# Patient Record
Sex: Female | Born: 1965 | Race: Black or African American | Hispanic: No | Marital: Single | Smoking: Current every day smoker
Health system: Southern US, Community
[De-identification: ages and names within clinical notes are randomized; demographics above are authoritative.]

## PROBLEM LIST (undated history)

## (undated) DIAGNOSIS — E119 Type 2 diabetes mellitus without complications: Secondary | ICD-10-CM

## (undated) DIAGNOSIS — E785 Hyperlipidemia, unspecified: Secondary | ICD-10-CM

## (undated) DIAGNOSIS — N184 Chronic kidney disease, stage 4 (severe): Secondary | ICD-10-CM

## (undated) DIAGNOSIS — Z992 Dependence on renal dialysis: Secondary | ICD-10-CM

## (undated) DIAGNOSIS — E079 Disorder of thyroid, unspecified: Secondary | ICD-10-CM

## (undated) DIAGNOSIS — I10 Essential (primary) hypertension: Secondary | ICD-10-CM

## (undated) DIAGNOSIS — N186 End stage renal disease: Secondary | ICD-10-CM

## (undated) DIAGNOSIS — D638 Anemia in other chronic diseases classified elsewhere: Secondary | ICD-10-CM

## (undated) HISTORY — DX: Hyperlipidemia, unspecified: E78.5

## (undated) HISTORY — DX: Disorder of thyroid, unspecified: E07.9

## (undated) HISTORY — DX: Essential (primary) hypertension: I10

---

## 1999-06-13 HISTORY — PX: CYSTECTOMY: SHX5119

## 2003-03-31 ENCOUNTER — Emergency Department (HOSPITAL_COMMUNITY): Admission: AD | Admit: 2003-03-31 | Discharge: 2003-03-31 | Payer: Self-pay | Admitting: Family Medicine

## 2008-03-18 ENCOUNTER — Emergency Department (HOSPITAL_COMMUNITY): Admission: EM | Admit: 2008-03-18 | Discharge: 2008-03-19 | Payer: Self-pay | Admitting: Emergency Medicine

## 2009-09-10 ENCOUNTER — Encounter: Payer: Self-pay | Admitting: Family

## 2009-09-10 LAB — CONVERTED CEMR LAB: Pap Smear: NORMAL

## 2009-09-10 LAB — HM MAMMOGRAPHY: HM Mammogram: NORMAL

## 2009-11-29 LAB — HM PAP SMEAR: HM Pap smear: NORMAL

## 2010-05-25 ENCOUNTER — Emergency Department (HOSPITAL_BASED_OUTPATIENT_CLINIC_OR_DEPARTMENT_OTHER)
Admission: EM | Admit: 2010-05-25 | Discharge: 2010-05-25 | Payer: Self-pay | Source: Home / Self Care | Admitting: Emergency Medicine

## 2010-05-30 ENCOUNTER — Ambulatory Visit: Payer: Self-pay | Admitting: Family

## 2010-05-30 ENCOUNTER — Emergency Department (HOSPITAL_BASED_OUTPATIENT_CLINIC_OR_DEPARTMENT_OTHER)
Admission: EM | Admit: 2010-05-30 | Discharge: 2010-05-30 | Payer: Self-pay | Source: Home / Self Care | Admitting: Emergency Medicine

## 2010-08-12 ENCOUNTER — Ambulatory Visit (INDEPENDENT_AMBULATORY_CARE_PROVIDER_SITE_OTHER): Payer: Self-pay | Admitting: Family

## 2010-08-12 ENCOUNTER — Encounter: Payer: Self-pay | Admitting: Family

## 2010-08-12 DIAGNOSIS — N898 Other specified noninflammatory disorders of vagina: Secondary | ICD-10-CM

## 2010-08-12 DIAGNOSIS — E119 Type 2 diabetes mellitus without complications: Secondary | ICD-10-CM | POA: Insufficient documentation

## 2010-08-12 DIAGNOSIS — I10 Essential (primary) hypertension: Secondary | ICD-10-CM

## 2010-08-12 DIAGNOSIS — E041 Nontoxic single thyroid nodule: Secondary | ICD-10-CM

## 2010-08-16 LAB — CONVERTED CEMR LAB
Gardnerella vaginalis: NEGATIVE
Trichomonal Vaginitis: NEGATIVE

## 2010-08-18 NOTE — Assessment & Plan Note (Signed)
Summary: seen in ed/high bp and diabetes/self pay/ss--rm 4   Vital Signs:  Patient profile:   44 year old female Height:      65 inches Weight:      284 pounds BMI:     47.43 Temp:     100 degrees F oral Pulse rate:   96 / minute Pulse rhythm:   regular Resp:     18 per minute BP sitting:   180 / 108  (right arm) Cuff size:   large  Vitals Entered By: Mervin Kung CMA [facility] Dull) (August 12, 2010 1:54 PM) CC: New pt to establish care. Referred from ED for hypertension and diabetes. Also has vaginal itching and white discharge. Has used OTC cream without relief. Is Patient Diabetic? Yes Pain Assessment Patient in pain? no      CBG Result 424 Comments Pt states she reduced Metformin to 1 tablet daily due to low BS (96) and feeling jittery.   CC:  New pt to establish care. Referred from ED for hypertension and diabetes. Also has vaginal itching and white discharge. Has used OTC cream without relief..  History of Present Illness: Ms.  Formoso is a 45 year old female who presents today to establish care.  She was seen in the ED in December for hyperglycemia and hypertension.  Moved here several months ago.   Has had meds from old provider until recently.  1) HTN- ran out of her lisinopril/hctz- 24 hours ago.    2) DM2-  was diagnosed in her 30s.  Takes metformin, sugar last night was 215,  + polyuria.   3) Tobacco abuse- smokes about 5 cigarettes a day.    4) Vaginal itching/discharge- tried OTC monistat without improvement.  Preventive Screening-Counseling & Management  Alcohol-Tobacco     Alcohol drinks/day: 0     Smoking Status: current     Packs/Day: <0.25     Pack years: 20  Caffeine-Diet-Exercise     Caffeine use/day: none     Does Patient Exercise: no      Drug Use:  no.    Allergies (verified): No Known Drug Allergies  Past History:  Past Medical History: Diabetes Frequent headaches HTN Hypercholesterolemia ? thyroid problem in the past Renal  insufficiency (1.6-1.9 creatinine)  Past Surgical History: c-section x 2 cystectomy, thyroid-- 2001  Family History: mother--living diabetes, htn, hypercholesterolemia father--deceased; lung cancer-  non-smoker,  died at 90 1 brother-- heart problems,  MI at age 39.   1 brother-- a & w 1 sister- diabetes 1 sister- a&w  2 sons-- a & w ages 21 and 18.    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  started at age 46.   Alcohol use-no Drug use-no Regular exercise-no Smoking Status:  current Packs/Day:  <0.25 Caffeine use/day:  none Does Patient Exercise:  no Drug Use:  no  Review of Systems       Constitutional: Denies Fever ENT:  Denies nasal congestion or sore throat. Resp: + cough- ? due to lisinopril CV:  Denies Chest Pain GI:  Denies nausea or vomitting or diarrhea GU: Denies dysuria.   GYN:  + vaginal discharge, + vaginal itching. Lymphatic: Denies lymphadenopathy Musculoskeletal:  Denies joint pain (right) Skin:  Denies Rashes Psychiatric: Denies depression Neuro: Denies numbness     Physical Exam  General:  Well-developed,well-nourished,in no acute distress; alert,appropriate and cooperative throughout examination Head:  Normocephalic and  atraumatic without obvious abnormalities. No apparent alopecia or balding. Eyes:  PERRLA, sclera clear Ears:  External ear exam shows no significant lesions or deformities.  Otoscopic examination reveals clear canals, tympanic membranes are intact bilaterally without bulging, retraction, inflammation or discharge. Hearing is grossly normal bilaterally. Neck:  No deformities, masses, or tenderness noted. Lungs:  Normal respiratory effort, chest expands symmetrically. Lungs are clear to auscultation, no crackles or wheezes. Heart:  Normal rate and regular rhythm. S1 and S2 normal without gallop, murmur, click, rub or other extra  sounds. Genitalia:  Pelvic Exam:        External: normal female genitalia without lesions or masses        Vagina: normal without lesions or masses        Cervix: normal without lesions or masses        Adnexa: normal bimanual exam without masses or fullness        Uterus: normal by palpation        Pap smear: not performed   Impression & Recommendations:  Problem # 1:  DIABETES MELLITUS, TYPE II (ICD-250.00)  glucose today is 424- pt reports that she took her metformin.  Due to renal insuffiency, it is not safe for her to continue her metformin.  Suspect a1c will be close to 11.  Will initiate insulin.  Pt was taught on proper insulin administration.  Given sample pen needles and levemir flex pen.  Will check labs as below.    Her updated medication list for this problem includes:    Levemir Flexpen 100 Unit/ml Soln (Insulin detemir) .Marland KitchenMarland KitchenMarland KitchenMarland Kitchen 15 units subcutaneous once daily in the evening.  Orders: T-Hgb A1C (81191-47829) TLB-Microalbumin/Creat Ratio, Urine (82043-MALB) Fingerstick (36416) Glucose, (CBG) (56213)  Problem # 2:  THYROID CYST (ICD-246.2) Assessment: Comment Only Pt reports that this was sugically removed.   Orders: TLB-TSH (Thyroid Stimulating Hormone) (84443-TSH)  Problem # 3:  HYPERTENSION (ICD-401.9) Assessment: Unchanged Not using birth control. Given age and smoking- not a good candidate for OCP.  Recommended that she stop ACE inhibitor for now due to pregnancy category D.  Continue HCTZ, add labetalol.   Her updated medication list for this problem includes:    Hydrochlorothiazide 25 Mg Tabs (Hydrochlorothiazide) ..... One tablet by mouth daily    Labetalol Hcl 100 Mg Tabs (Labetalol hcl) ..... One tablet by mouth two times a day  Her updated medication list for this problem includes:    Hydrochlorothiazide 25 Mg Tabs (Hydrochlorothiazide) ..... One tablet by mouth daily    Labetalol Hcl 100 Mg Tabs (Labetalol hcl) ..... One tablet by mouth two times a  day  Problem # 4:  VAGINAL DISCHARGE (ICD-623.5) Assessment: New will treat with diflucan.   Wet prep performed today.  Complete Medication List: 1)  Hydrochlorothiazide 25 Mg Tabs (Hydrochlorothiazide) .... One tablet by mouth daily 2)  Levemir Flexpen 100 Unit/ml Soln (Insulin detemir) .Marland Kitchen.. 15 units subcutaneous once daily in the evening. 3)  Labetalol Hcl 100 Mg Tabs (Labetalol hcl) .... One tablet by mouth two times a day 4)  Fluconazole 150 Mg Tabs (Fluconazole) .... One tablet by mouth now, may repeat in 3 days if continued vaginal itching.  Other Orders: Specimen Handling (08657) T-Wet Prep by [facility] [phone])  Patient Instructions: 1)  Stop metformin.   2)  Start levemir. 3)  Call if sugars greater than 300 after starting levemir, or if you develop sugars less than 80. 4)  Follow up in 2 weeks. Prescriptions: FLUCONAZOLE 150 MG  TABS (FLUCONAZOLE) one tablet by mouth now, may repeat in 3 days if continued vaginal itching.  #2 x 0   Entered and Authorized by:   Lemont Fillers FNP   Signed by:   Lemont Fillers FNP on 08/12/2010   Method used:   Electronically to        [facility] [phone]* (retail)       [address]       [place], [place]  [address]       Ph: [phone]       Fax: [phone]   RxID:   [id number] LEVEMIR FLEXPEN 100 UNIT/ML SOLN (INSULIN DETEMIR) 15 units subcutaneous once daily in the evening.  #2 x 0   Entered and Authorized by:   Lemont Fillers FNP   Signed by:   Lemont Fillers FNP on 08/12/2010   Method used:   Samples Given   RxID:   [id number] LABETALOL HCL 100 MG TABS (LABETALOL HCL) one tablet by mouth two times a day  #60 x 1   Entered and Authorized by:   Lemont Fillers FNP   Signed by:   Lemont Fillers FNP on 08/12/2010   Method used:   Electronically to        [facility] [phone]* (retail)       [address]       [place], [place]  [address]       Ph: [phone]        Fax: [phone]   RxID:   [id number] HYDROCHLOROTHIAZIDE 25 MG TABS (HYDROCHLOROTHIAZIDE) one tablet by mouth daily  #30 x 1   Entered and Authorized by:   Lemont Fillers FNP   Signed by:   Lemont Fillers FNP on 08/12/2010   Method used:   Electronically to        [facility] [phone]* (retail)       [address]       [place], [place]  [address]       Ph: [phone]       Fax: [phone]   RxID:   [id number]    Orders Added: 1)  TLB-TSH (Thyroid Stimulating Hormone) [84443-TSH] 2)  T-Hgb A1C [83036-23375] 3)  TLB-Microalbumin/Creat Ratio, Urine [82043-MALB] 4)  Fingerstick [36416] 5)  Glucose, (CBG) [82962] 6)  New Patient Level II [99202] 7)  Specimen Handling [99000] 8)  T-Wet Prep by Molecular Probe [99371-69678] 9)  New Patient Level III [93810]    Current Allergies (reviewed today): No known allergies     Preventive Care Screening  PPD:    Date:  10/10/2009    Results:  negative   Mammogram:    Date:  09/10/2009    Results:  normal   Pap Smear:    Date:  09/10/2009    Results:  normal      Doesn't remember last tetanus.   Laboratory Results   Blood Tests    Date/Time Reported: Mervin Kung CMA [facility] Dull)  August 12, 2010 2:43 PM   CBG Random:: 424mg /dL

## 2010-08-19 ENCOUNTER — Telehealth: Payer: Self-pay | Admitting: Family

## 2010-08-22 LAB — BASIC METABOLIC PANEL
BUN: 22 mg/dL (ref 6–23)
Chloride: 104 mEq/L (ref 96–112)
Glucose, Bld: 349 mg/dL — ABNORMAL HIGH (ref 70–99)
Potassium: 4.5 mEq/L (ref 3.5–5.1)

## 2010-08-22 LAB — URINALYSIS, ROUTINE W REFLEX MICROSCOPIC
Bilirubin Urine: NEGATIVE
Ketones, ur: NEGATIVE mg/dL
Nitrite: NEGATIVE
Specific Gravity, Urine: 1.022 (ref 1.005–1.030)
Urobilinogen, UA: 0.2 mg/dL (ref 0.0–1.0)

## 2010-08-22 LAB — PREGNANCY, URINE: Preg Test, Ur: NEGATIVE

## 2010-08-23 LAB — BASIC METABOLIC PANEL
GFR calc Af Amer: 35 mL/min — ABNORMAL LOW (ref >60)
GFR calc non Af Amer: 29 mL/min — ABNORMAL LOW (ref >60)
Glucose, Bld: 444 mg/dL — ABNORMAL HIGH (ref 70–99)
Potassium: 4.3 mEq/L (ref 3.5–5.1)
Sodium: 140 mEq/L (ref 135–145)

## 2010-08-23 LAB — GC/CHLAMYDIA PROBE AMP, GENITAL
Chlamydia, DNA Probe: NEGATIVE
GC Probe Amp, Genital: NEGATIVE

## 2010-08-23 LAB — GLUCOSE, CAPILLARY: Glucose-Capillary: 465 mg/dL — ABNORMAL HIGH (ref 70–99)

## 2010-08-23 LAB — WET PREP, GENITAL: Yeast Wet Prep HPF POC: NONE SEEN

## 2010-08-23 LAB — URINALYSIS, ROUTINE W REFLEX MICROSCOPIC
Bilirubin Urine: NEGATIVE
Glucose, UA: >1000 mg/dL — AB
Hgb urine dipstick: NEGATIVE
Specific Gravity, Urine: 1.025 (ref 1.005–1.030)
Urobilinogen, UA: 0.2 mg/dL (ref 0.0–1.0)

## 2010-08-23 LAB — URINE MICROSCOPIC-ADD ON

## 2010-08-29 ENCOUNTER — Ambulatory Visit: Payer: Self-pay | Admitting: Family

## 2010-08-29 DIAGNOSIS — Z0289 Encounter for other administrative examinations: Secondary | ICD-10-CM

## 2010-08-30 NOTE — Progress Notes (Signed)
Summary: lab order, f/u  Phone Note Outgoing Call   Summary of Call: Please call patient and ask her to return to the lab at her earilest convenience to complete lab work ordered during her visit.  Initial call taken by: Lemont Fillers FNP,  August 19, 2010 8:22 AM  Follow-up for Phone Call        Left message on machine to return my call. Nicki Guadalajara Fergerson CMA [facility] Dull)  August 19, 2010 8:36 AM   Pt notified and states she is having transportation difficulties at present. Advised pt she could have blood work done at her 2 week f/u. Appt scheduled for 08/29/10 @ 2:15pm. Mervin Kung CMA (AAMA)  August 22, 2010 9:18 AM

## 2010-10-05 ENCOUNTER — Telehealth: Payer: Self-pay | Admitting: *Deleted

## 2010-10-05 MED ORDER — INSULIN DETEMIR 100 UNIT/ML ~~LOC~~ SOLN: 15.0000 [IU] | Freq: Every evening | SUBCUTANEOUS | Status: DC

## 2010-10-05 NOTE — Telephone Encounter (Signed)
Pt called stating she does not have ins. Coverage and her hours have been cut back at work. States she has not been able to afford to have lab work completed. She has scheduled a follow up for 10/07/10 and is requesting a refill on her Levemir insulin. Advised pt she could pick up samples at the office. Gave pt # for Professional Fee Billing to see if she will qualify for assistance.

## 2010-10-06 ENCOUNTER — Encounter: Payer: Self-pay | Admitting: Family

## 2010-10-07 ENCOUNTER — Ambulatory Visit: Payer: Self-pay | Admitting: Family

## 2010-10-10 ENCOUNTER — Ambulatory Visit: Payer: Self-pay | Admitting: Family

## 2010-10-10 DIAGNOSIS — Z0289 Encounter for other administrative examinations: Secondary | ICD-10-CM

## 2010-11-01 ENCOUNTER — Encounter: Payer: Self-pay | Admitting: Family

## 2010-11-01 ENCOUNTER — Telehealth: Payer: Self-pay | Admitting: Family

## 2010-11-01 ENCOUNTER — Ambulatory Visit (INDEPENDENT_AMBULATORY_CARE_PROVIDER_SITE_OTHER): Payer: Self-pay | Admitting: Family

## 2010-11-01 DIAGNOSIS — L304 Erythema intertrigo: Secondary | ICD-10-CM | POA: Insufficient documentation

## 2010-11-01 DIAGNOSIS — B373 Candidiasis of vulva and vagina: Secondary | ICD-10-CM

## 2010-11-01 DIAGNOSIS — E119 Type 2 diabetes mellitus without complications: Secondary | ICD-10-CM

## 2010-11-01 DIAGNOSIS — B3731 Acute candidiasis of vulva and vagina: Secondary | ICD-10-CM | POA: Insufficient documentation

## 2010-11-01 DIAGNOSIS — R809 Proteinuria, unspecified: Secondary | ICD-10-CM | POA: Insufficient documentation

## 2010-11-01 DIAGNOSIS — L538 Other specified erythematous conditions: Secondary | ICD-10-CM

## 2010-11-01 DIAGNOSIS — I10 Essential (primary) hypertension: Secondary | ICD-10-CM

## 2010-11-01 MED ORDER — INSULIN DETEMIR 100 UNIT/ML ~~LOC~~ SOLN: SUBCUTANEOUS | Status: DC

## 2010-11-01 MED ORDER — CLONIDINE HCL 0.1 MG PO TABS: 0.1000 mg | ORAL_TABLET | Freq: Two times a day (BID) | ORAL | Status: DC

## 2010-11-01 MED ORDER — GLUCOSE BLOOD VI STRP: ORAL_STRIP | Status: AC

## 2010-11-01 MED ORDER — MICONAZOLE NITRATE 2 % EX AERP: INHALATION_SPRAY | CUTANEOUS | Status: DC

## 2010-11-01 MED ORDER — LABETALOL HCL 200 MG PO TABS: 200.0000 mg | ORAL_TABLET | Freq: Two times a day (BID) | ORAL | Status: DC

## 2010-11-01 NOTE — Assessment & Plan Note (Signed)
Recommended trial of monistat.

## 2010-11-01 NOTE — Assessment & Plan Note (Signed)
This is likely multifactorial in the setting of uncontrolled diabetes and hypertension which is uncontrolled. Unfortunately given her childbearing age she is not a good candidate for an ACE inhibitor at this time. She is not currently on any birth control and has a positive smoking history- therefore she is not a good candidate for oral contraceptive. IUD is not reasonable at this time due to cost. We will need to work to get her blood pressure and sugar optimally controlled.

## 2010-11-01 NOTE — Telephone Encounter (Signed)
Left message for patient to return our call.  When she calls back please let her know that I have sent a prescription for clonidine BID to her pharmacy as well to help with her BP. This is on the the $4 plan at [facility].

## 2010-11-01 NOTE — Assessment & Plan Note (Signed)
Recommended trial of lotrimin powder.

## 2010-11-01 NOTE — Assessment & Plan Note (Signed)
BP Readings from Last 3 Encounters:  11/01/10 180/110  08/12/10 180/108    Blood pressure is unchanged. She reports positive compliance with meds. Will increase her labetalol from 100 mg twice daily to 200 mg twice daily.

## 2010-11-01 NOTE — Progress Notes (Signed)
  Subjective:    Patient ID: Teresa Dalton, female    DOB: [birth year], 45 y.o.   MRN: [id number]  HPI  [name] is a 44 year old female who presents today for follow up of her diabetes and her HTN.  She is hopeful that she may obtain health insurance in the near future.  1. DM-  22 units of levemir in the morning.  Occasionally taking 12 units at night if her sugar is >200.  Fasting sugar is as low as 186 if she takes the levemir in the evening.    2. HTN- notes + compliance with her BP meds.   3. Rash- reports that she often gets rash beneath her breasts- worst in the heat.    Review of Systems  See HPI  Past Medical History  Diagnosis Date  . Diabetes mellitus   . Hypertension   . Hyperlipidemia   . Thyroid disease     ? past problem  . Chronic kidney disease     renal insufficiency (1.6-1.9 creatinine)  . Increased frequency of headaches     History   Social History  . Marital Status: Single    Spouse Name: N/A    Number of Children: 2  . Years of Education: N/A   Occupational History  . RESIDENCIAL COUNSELO    Social History Main Topics  . Smoking status: Current Everyday Smoker -- 17 years    Types: Cigarettes  . Smokeless tobacco: Not on file   Comment: 5 cigarettes daily  . Alcohol Use: No  . Drug Use: No  . Sexually Active: Not on file   Other Topics Concern  . Not on file   Social History Narrative   Regular exercise: noLives with one of her sons    Past Surgical History  Procedure Date  . Cesarean section     x 2  . Cystectomy 2001    thyroid    Family History  Problem Relation Age of Onset  . Diabetes Mother   . Heart disease Mother   . Hyperlipidemia Mother   . Cancer Father     lung, non-smoker. deceased 41yr  . Diabetes Sister   . Heart disease Brother     MI @ 27    No Known Allergies  Current Outpatient Prescriptions on File Prior to Visit  Medication Sig Dispense Refill  . hydrochlorothiazide 25 MG tablet Take  25 mg by mouth daily.        Marland Kitchen DISCONTD: insulin detemir (LEVEMIR FLEXPEN) 100 UNIT/ML injection Inject 15 Units into the skin every evening.  10 mL  12  . DISCONTD: labetalol (NORMODYNE) 100 MG tablet Take 100 mg by mouth 2 (two) times daily.          BP 180/110  Pulse 86  Temp(Src) 98.8 F (37.1 C) (Oral)  Resp 22  Wt 286 lb (129.729 kg)  SpO2 99%  LMP 10/11/2010        Objective:   Physical Exam  Constitutional: She appears well-developed and well-nourished.  Cardiovascular: Normal rate and regular rhythm.   Pulmonary/Chest: Effort normal and breath sounds normal.  Musculoskeletal: She exhibits no edema.          Assessment & Plan:  levemir flex pen 10/2012 [id number], #2

## 2010-11-01 NOTE — Assessment & Plan Note (Signed)
We will plan to titrate her Levemir upward. She has not been keeping record of her blood sugars. I have advised her to check her blood sugar at least twice a day once fasting and then again later in the afternoon or evening. She is to call within one week with these readings. She is then to followup in 2 weeks for followup appointment.

## 2010-11-01 NOTE — Patient Instructions (Signed)
Take 22 units of Levemir in the AM and 15 units of levemir in the pm. Check your fasting sugar and again in the afternoon or evening, record these results and call them to [facility] in 1 week. Call sooner if you develop sugar <80. Follow up in 2 weeks.

## 2010-11-02 MED ORDER — FLUCONAZOLE 150 MG PO TABS: ORAL_TABLET | ORAL | Status: DC

## 2010-11-02 NOTE — Telephone Encounter (Signed)
Pt notified. Pt requested med for yeast infection. Advised pt to try OTC monistat per Sandford Craze, NP. Pt states that monistat does not clear her infections in the past. Pt requests rx of Diflucan. Please advise.

## 2010-11-02 NOTE — Telephone Encounter (Signed)
Pt returned phone call. Best # [phone]

## 2010-11-02 NOTE — Telephone Encounter (Signed)
Rx sent 

## 2010-11-02 NOTE — Telephone Encounter (Signed)
Pt advised.

## 2010-11-08 ENCOUNTER — Ambulatory Visit (INDEPENDENT_AMBULATORY_CARE_PROVIDER_SITE_OTHER): Payer: Self-pay | Admitting: Family

## 2010-11-08 ENCOUNTER — Telehealth: Payer: Self-pay | Admitting: Family

## 2010-11-08 ENCOUNTER — Encounter: Payer: Self-pay | Admitting: Family

## 2010-11-08 DIAGNOSIS — H02849 Edema of unspecified eye, unspecified eyelid: Secondary | ICD-10-CM

## 2010-11-08 DIAGNOSIS — I10 Essential (primary) hypertension: Secondary | ICD-10-CM

## 2010-11-08 MED ORDER — ERYTHROMYCIN 5 MG/GM OP OINT: TOPICAL_OINTMENT | OPHTHALMIC | Status: DC

## 2010-11-08 MED ORDER — AMOXICILLIN-POT CLAVULANATE 875-125 MG PO TABS: 1.0000 | ORAL_TABLET | Freq: Two times a day (BID) | ORAL | Status: AC

## 2010-11-08 NOTE — Progress Notes (Signed)
Subjective:    Patient ID: Teresa Dalton, female    DOB: [birth year], 45 y.o.   MRN: [id number]  HPI Ms.  Stangelo is a 45 year old female who presents today with chief complaint of right eyelid swelling.  Symptoms started Friday morning.  Initially, just slightly swollen upper lid.  + associated itching.  +pus like drainage out of the right eye.  Denies associated fever or difficulty seeing out of the right eye.    HTN- has not been taking HCTZ every day due to "it makes me pee at work."  She skips the HCTZ on the days that she works 3x a week.  Also, only took 100mg  of labetalol this AM.  Never started the clonidine. Review of Systems See HPI  Past Medical History  Diagnosis Date  . Diabetes mellitus   . Hypertension   . Hyperlipidemia   . Thyroid disease     ? past problem  . Chronic kidney disease     renal insufficiency (1.6-1.9 creatinine)  . Increased frequency of headaches     History   Social History  . Marital Status: Single    Spouse Name: N/A    Number of Children: 2  . Years of Education: N/A   Occupational History  . [employer]    Social History Main Topics  . Smoking status: Current Everyday Smoker -- 17 years    Types: Cigarettes  . Smokeless tobacco: Not on file   Comment: 5 cigarettes daily  . Alcohol Use: No  . Drug Use: No  . Sexually Active: Not on file   Other Topics Concern  . Not on file   Social History Narrative   Regular exercise: noLives with one of her sons    Past Surgical History  Procedure Date  . Cesarean section     x 2  . Cystectomy 2001    thyroid    Family History  Problem Relation Age of Onset  . Diabetes Mother   . Heart disease Mother   . Hyperlipidemia Mother   . Cancer Father     lung, non-smoker. deceased 59yr  . Diabetes Sister   . Heart disease Brother     MI @ 61    No Known Allergies  Current Outpatient Prescriptions on File Prior to Visit  Medication Sig Dispense Refill  . glucose  blood test strip Use as instructed  100 each  11  . hydrochlorothiazide 25 MG tablet Take 25 mg by mouth daily.        . insulin detemir (LEVEMIR) 100 UNIT/ML injection Take as directed  10 mL  0  . labetalol (NORMODYNE) 200 MG tablet Take 1 tablet (200 mg total) by mouth 2 (two) times daily.  60 tablet  2  . cloNIDine (CATAPRES) 0.1 MG tablet Take 1 tablet (0.1 mg total) by mouth 2 (two) times daily.  60 tablet  30  . Miconazole Nitrate 2 % AERP Apply powder twice daily to affected areas  1 Can  0  . DISCONTD: fluconazole (DIFLUCAN) 150 MG tablet One tablet by mouth today- may repeat in 3 days if no improvement in symptoms.  2 tablet  0    BP 186/110  Pulse 78  Temp(Src) 98.6 F (37 C) (Oral)  Resp 18  Ht 5\' 5"  (1.651 m)  Wt 282 lb 1.9 oz (127.969 kg)  BMI 46.95 kg/m2  LMP 10/11/2010       Objective:   Physical Exam  Gen:  Morbidly  obese, AA female, awake, alert, NAD Eyes: R upper lid is swollen without significant associated erythema.  Sclera are clear without injection.  Vision is grossly intact.  PERRLA.          Assessment & Plan:

## 2010-11-08 NOTE — Assessment & Plan Note (Signed)
Plan empiric treatment for cellulitis with augmentin.  Will also treat with topical erythromycin.  My suspicion is that this is more of an allergic reaction- possibly to a bug bite.  She was instructed to call if increasing redness/swelling, pain or fever.  Go to ED if problems with vision.

## 2010-11-08 NOTE — Telephone Encounter (Signed)
Please call patient's pharmacy and ask them to fill her clonidine rx that I sent.

## 2010-11-08 NOTE — Patient Instructions (Addendum)
Call if you develop increased, pain, swelling, of the right eyelid or surrounding area or if you develop fever. Go to ER if you develop difficulty seeing out of your eye. Follow up on Friday 6/1.

## 2010-11-08 NOTE — Assessment & Plan Note (Signed)
BP Readings from Last 3 Encounters:  11/08/10 186/110  11/01/10 180/110  08/12/10 180/108   Very Non-compliant. I suspect that she is not taking her meds at all.  I urged her to take meds as directed and to pick up and start her clonidine today ASAP.  She verbalized understanding.  We discussed risks associated with uncontrolled HTN including stroke/death.

## 2010-11-11 NOTE — Telephone Encounter (Signed)
Call placed to [facility] pharmacy at [phone], spoke with  [facility] Rx filled for years supply.

## 2010-11-30 ENCOUNTER — Telehealth: Payer: Self-pay | Admitting: *Deleted

## 2010-11-30 NOTE — Telephone Encounter (Signed)
Received call from pt requesting samples of Levemir. Pt currently receiving Levemir Flexpen. We are out of flex pen but have vial. Advised pt I will show her how to use vial and syringe. Pt will meet with me at 11:30am today. Will plan to give pt assistance forms to pt to complete and mail to [employer] for insulin samples.

## 2010-12-01 NOTE — Telephone Encounter (Signed)
Pt stopped by office to pick up sample of Levemir vial. Syringes were provided to pt and pt was instructed on proper technique to draw up and give injection. Signed pt assistance form was given to pt to complete and mail to [employer] with financial documentation.

## 2010-12-01 NOTE — Telephone Encounter (Signed)
Pt did not stop by office yesterday. Spoke to pt this a.m. And she states she will come by today as she was unable to get by during our hours of operation yesterday. Advised pt that I have paperwork to give her to apply for medication assistance.

## 2010-12-30 ENCOUNTER — Telehealth: Payer: Self-pay | Admitting: Family

## 2010-12-30 MED ORDER — FLUCONAZOLE 150 MG PO TABS: ORAL_TABLET | ORAL | Status: DC

## 2010-12-30 NOTE — Telephone Encounter (Signed)
Rx sent to pharmacy, Pt requested that rx go to [facility].  Canceled rx on [address] location.  Pt instructed to call us if her symptoms do not resolve with use of diflucan.

## 2010-12-30 NOTE — Telephone Encounter (Signed)
Patient returned phone call. Best# [phone]

## 2010-12-30 NOTE — Telephone Encounter (Signed)
Pt states she has vaginal itching and slight discharge. Her blood sugars are running higher since symptoms began x 3 days. Pt states that OTC monistat has not been as effective as the pills we called in for her before. She is requesting refill on Fluconazole that was prescribed for her in March. Please advise.

## 2010-12-30 NOTE — Telephone Encounter (Signed)
Left message for pt to return my call.

## 2010-12-30 NOTE — Telephone Encounter (Signed)
Patient states that she has had a yeast infection for a couple of days ago. She states that she has seen [place] regarding this before. Patient also states that the yeast infection is making her blood sugar rise? [facility] on [address]

## 2011-01-20 ENCOUNTER — Telehealth: Payer: Self-pay | Admitting: *Deleted

## 2011-01-20 NOTE — Telephone Encounter (Signed)
Received call from pt requesting sample of Levemir insulin. Advised pt that she can pick up sample on Monday. Pt reports that she has enough medication to last her until then.  Pt has no follow up on file. Please advise when pt should be seen.

## 2011-01-20 NOTE — Telephone Encounter (Signed)
She is overdue for follow up.  I would like to see her next week.

## 2011-01-20 NOTE — Telephone Encounter (Signed)
Call placed to patient at [phone], she was advised per Sandford Craze instruction. Patient states that she has to work and can not schedule until the week of the 20th. Appointment made for Monday 01/30/2011 @ 2:30pm

## 2011-01-24 ENCOUNTER — Encounter: Payer: Self-pay | Admitting: Family

## 2011-01-30 ENCOUNTER — Ambulatory Visit: Payer: Self-pay | Admitting: Family

## 2011-01-30 DIAGNOSIS — Z029 Encounter for administrative examinations, unspecified: Secondary | ICD-10-CM

## 2011-02-24 ENCOUNTER — Telehealth: Payer: Self-pay | Admitting: *Deleted

## 2011-02-24 NOTE — Telephone Encounter (Signed)
Pt reports very painful menstrual cycles, "passes clots" x 1 year. Reports that Midol helps ease some of the pain but she feels she is not able to function normally as she sometimes has to stay in bed. Pt wants to discuss birth control or other options to help with these symptoms. Advised pt she should be seen in the office to discuss options. Pt states she has appt on Monday and will discuss with Melissa at that time.

## 2011-02-27 ENCOUNTER — Ambulatory Visit: Payer: Self-pay | Admitting: Family

## 2011-03-06 ENCOUNTER — Ambulatory Visit: Payer: Self-pay | Admitting: Family

## 2011-03-06 DIAGNOSIS — Z0289 Encounter for other administrative examinations: Secondary | ICD-10-CM

## 2011-03-09 ENCOUNTER — Telehealth: Payer: Self-pay | Admitting: *Deleted

## 2011-03-09 NOTE — Telephone Encounter (Signed)
Received call from pt requesting samples or refill of Levemir. Advised pt that she is past due for an appt and will need to be seen before we can provide further samples or Rx. Pt states she is without insurance and unemployed. States this is the reason she has not been keeping her appts as she does not want to run up bills that she cannot pay. Provided pt with the number to [facility] to call and get appt for continued care. Please advise re: refill.

## 2011-03-09 NOTE — Telephone Encounter (Signed)
I agree that the patient needs to be seen before further refills/samples are provided.

## 2011-03-10 ENCOUNTER — Ambulatory Visit (INDEPENDENT_AMBULATORY_CARE_PROVIDER_SITE_OTHER): Payer: Self-pay | Admitting: Family

## 2011-03-10 ENCOUNTER — Encounter: Payer: Self-pay | Admitting: Family

## 2011-03-10 VITALS — BP 170/108 | HR 78 | Temp 98.7°F | Resp 16 | Ht 65.0 in | Wt 282.0 lb

## 2011-03-10 DIAGNOSIS — I10 Essential (primary) hypertension: Secondary | ICD-10-CM

## 2011-03-10 DIAGNOSIS — Z720 Tobacco use: Secondary | ICD-10-CM

## 2011-03-10 DIAGNOSIS — N189 Chronic kidney disease, unspecified: Secondary | ICD-10-CM | POA: Insufficient documentation

## 2011-03-10 DIAGNOSIS — E119 Type 2 diabetes mellitus without complications: Secondary | ICD-10-CM

## 2011-03-10 DIAGNOSIS — F17200 Nicotine dependence, unspecified, uncomplicated: Secondary | ICD-10-CM

## 2011-03-10 MED ORDER — AMLODIPINE BESYLATE 5 MG PO TABS: 5.0000 mg | ORAL_TABLET | Freq: Every day | ORAL | Status: DC

## 2011-03-10 MED ORDER — INSULIN DETEMIR 100 UNIT/ML ~~LOC~~ SOLN: SUBCUTANEOUS | Status: DC

## 2011-03-10 NOTE — Telephone Encounter (Signed)
Spoke to pt, she states she has appt with [facility] on 04/01/11 but will be out of insulin after today. Schedule pt appt. For 11:15 today with Sandford Craze, NP.

## 2011-03-10 NOTE — Assessment & Plan Note (Signed)
She was counseled on smoking cessation- only smoking 2 cigarettes a day. I recommended the nicorette gum.

## 2011-03-10 NOTE — Patient Instructions (Signed)
Please complete your blood work on the first floor.  Start amlodipine for your blood pressure. Follow up in 1 month.

## 2011-03-10 NOTE — Progress Notes (Signed)
  Subjective:    Patient ID: Teresa Dalton, female    DOB: 06-28-1965, 45 y.o.   MRN: [id number]  HPI  1) DM2- She reports that she "raised her dose of levemir".  50AM 30 units PM. Now having fasting sugars of 130-140.  At night sugars run about 220, before bed sugar is 150.  She reports that when her sugar is high she has urinary incontinence.    2) HTN- reports that she has not been taking HCTZ every day.  She stopped clonidine due to elevated blood pressure.       Review of Systems  Respiratory: Negative for shortness of breath.   Cardiovascular: Negative for chest pain, palpitations and leg swelling.  Genitourinary:       + polyuria.  Neurological: Positive for headaches.   Past Medical History  Diagnosis Date  . Diabetes mellitus   . Hypertension   . Hyperlipidemia   . Thyroid disease     ? past problem  . Chronic kidney disease     renal insufficiency (1.6-1.9 creatinine)  . Increased frequency of headaches     History   Social History  . Marital Status: Single    Spouse Name: N/A    Number of Children: 2  . Years of Education: N/A   Occupational History  . [employer]    Social History Main Topics  . Smoking status: Current Everyday Smoker -- 17 years    Types: Cigarettes  . Smokeless tobacco: Not on file   Comment: 2-3 cigarettes daily- started at 45 yrs old  . Alcohol Use: No  . Drug Use: No  . Sexually Active: Not on file   Other Topics Concern  . Not on file   Social History Narrative   Regular exercise: noLives with one of her sons    Past Surgical History  Procedure Date  . Cesarean section     x 2  . Cystectomy 2001    thyroid    Family History  Problem Relation Age of Onset  . Diabetes Mother   . Heart disease Mother   . Hyperlipidemia Mother   . Cancer Father     lung, non-smoker. deceased 16yr  . Diabetes Sister   . Heart disease Brother     MI @ 50    No Known Allergies  Current Outpatient Prescriptions on  File Prior to Visit  Medication Sig Dispense Refill  . glucose blood test strip Use as instructed  100 each  11  . hydrochlorothiazide 25 MG tablet Take 25 mg by mouth daily.        Marland Kitchen labetalol (NORMODYNE) 200 MG tablet Take 1 tablet (200 mg total) by mouth 2 (two) times daily.  60 tablet  2    BP 170/108  Pulse 78  Temp(Src) 98.7 F (37.1 C) (Oral)  Resp 16  Ht 5\' 5"  (1.651 m)  Wt 282 lb (127.914 kg)  BMI 46.93 kg/m2       Objective:   Physical Exam  Constitutional: She appears well-developed and well-nourished.  Cardiovascular: Normal rate and regular rhythm.   No murmur heard. Pulmonary/Chest: Effort normal and breath sounds normal. No respiratory distress. She has no wheezes. She has no rales. She exhibits no tenderness.  Musculoskeletal: She exhibits no edema.  Psychiatric: She has a normal mood and affect. Her behavior is normal. Judgment and thought content normal.          Assessment & Plan:

## 2011-03-10 NOTE — Assessment & Plan Note (Signed)
Will add amlodipine, recommended that she continue the labetalol and recommended daily use of the HCTZ.

## 2011-03-10 NOTE — Assessment & Plan Note (Signed)
Likely due to uncontrolled HTN and diabetes. Compliance reinforced. She is not using any birth control, therefore I am hesitant to place her on ACE. She is a poor candidate for OCP due to smoking.  She would be a good candidate for IUD but this is currently unaffordable.

## 2011-03-10 NOTE — Assessment & Plan Note (Addendum)
Per report, numbers are improved.  She has self titrated her levemir and is tolerating without difficulty or hypoglycemia.  I have advised the patient to change from 50 units/30 units to 40 units bid. She is uninsured and unemployed.  She is looking into [facility].  I also discussed with her speaking to the finance dept at [facility] to see if she may apply for her [facility] insurance.  In the meantime- will initiate a levemir pt assistance for her.

## 2011-03-13 LAB — URINALYSIS, ROUTINE W REFLEX MICROSCOPIC
Glucose, UA: NEGATIVE
Ketones, ur: NEGATIVE
Nitrite: NEGATIVE
Protein, ur: >300 — AB
pH: 6

## 2011-03-13 LAB — URINE MICROSCOPIC-ADD ON

## 2011-03-13 LAB — POCT I-STAT, CHEM 8
Calcium, Ion: 1.24
Chloride: 104
Creatinine, Ser: 1.4 — ABNORMAL HIGH
Glucose, Bld: 188 — ABNORMAL HIGH
HCT: 42
Potassium: 4

## 2011-03-13 LAB — DIFFERENTIAL
Lymphs Abs: 3.3
Monocytes Relative: 7
Neutro Abs: 6.1
Neutrophils Relative %: 58

## 2011-03-13 LAB — CBC
Platelets: 276
RBC: 4.77
WBC: 10.5

## 2011-03-13 LAB — GLUCOSE, CAPILLARY: Glucose-Capillary: 203 — ABNORMAL HIGH

## 2011-03-16 ENCOUNTER — Telehealth: Payer: Self-pay | Admitting: *Deleted

## 2011-03-16 NOTE — Telephone Encounter (Signed)
Printed pt assistance application for Levemir. [employer]  Ph) [phone]  Fax) [phone]. Will forward to Provider for completion and Rx signature.

## 2011-03-21 MED ORDER — INSULIN DETEMIR 100 UNIT/ML ~~LOC~~ SOLN: 40.0000 [IU] | Freq: Two times a day (BID) | SUBCUTANEOUS | Status: DC

## 2011-03-21 MED ORDER — INSULIN PEN NEEDLE 31G X 5 MM MISC: Status: AC

## 2011-03-21 NOTE — Telephone Encounter (Signed)
Attempted to print rx and sent it electronically to [facility]. Called pharmacist and cancelled Rx. Printed Rx for [employer] and forwarded to Provider with application for signature and completion.

## 2011-03-23 NOTE — Telephone Encounter (Signed)
Pt also states she is out of Labetalol and it will be next Wednesday before she will be able to get her medication. Advised pt we do not get samples of that med. Is there another med we can substitute?

## 2011-03-23 NOTE — Telephone Encounter (Signed)
Notified pt that pt assistance forms were ready for her completion and financial documentation. Pt states she does not have recent tax return copy. Advised her to contact her accountant to see if a copy could be obtained from them and she will let me know once she has obtained documentation.  Pt states she has an appt with [facility] on 04/01/11.

## 2011-03-24 NOTE — Telephone Encounter (Signed)
We do not have any samples that I would recommend substituting for labetalol at this time.  She could take two tablets of her norvasc once daily until she obtains her labetalol and then drop back down to 5mg  of norvasc.

## 2011-03-27 ENCOUNTER — Telehealth: Payer: Self-pay | Admitting: Family

## 2011-03-27 NOTE — Telephone Encounter (Signed)
Notified pt per verbal from Melissa to continue Norvasc 5mg  2 tablets daily until she can be seen on 04/01/11 by [facility]. Pt states she was able to pick up 6 Labetalol tablets at her pharmacy this weekend. Advised pt to continue Norvasc 5mg  1 tablet daily while she is taking Labetalol. Once she has completed current supply of Labetalol she can increase Norvasc 5mg  to 2 tablets daily. Pt voices understanding.

## 2011-03-27 NOTE — Telephone Encounter (Signed)
Patient states that her copay for labetalol went up to $100 from $30. Patient states that she cannot afford that. She would like something else called in. Patient states that she has been w/o blood pressure meds now for 3 days.

## 2011-03-27 NOTE — Telephone Encounter (Signed)
See previous phone note of 03/16/11.

## 2011-03-27 NOTE — Telephone Encounter (Signed)
Please advise of substitute?  Teresa Dalton 03/27/2011 9:22 AM Signed  Patient states that her copay for labetalol went up to $100 from $30. Patient states that she cannot afford that. She would like something else called in. Patient states that she has been w/o blood pressure meds now for 3 days.

## 2011-04-10 ENCOUNTER — Telehealth: Payer: Self-pay | Admitting: *Deleted

## 2011-04-10 MED ORDER — FLUCONAZOLE 150 MG PO TABS: 150.0000 mg | ORAL_TABLET | Freq: Once | ORAL | Status: AC

## 2011-04-10 MED ORDER — INSULIN DETEMIR 100 UNIT/ML ~~LOC~~ SOLN: 40.0000 [IU] | Freq: Two times a day (BID) | SUBCUTANEOUS | Status: DC

## 2011-04-10 NOTE — Telephone Encounter (Signed)
Left message on machine to return my call. 

## 2011-04-10 NOTE — Telephone Encounter (Signed)
I will send diflucan rx to pharmacy, but she will need to be seen in the office to evaluated her congestion.

## 2011-04-10 NOTE — Telephone Encounter (Signed)
Pt called stating she went for her appt [facility] today and "they had scheduled her with a dentist". Pt states they r/s her appt for 2 weeks out. Pt has still been purchasing 6 labetalol at a time and wanted to know what to do going forward. Reminded pt per previous phone note to take 2 tablets of Norvasc daily once she completes the Labetalol until she can be seen at [facility]. Pt requested samples of Levemir. 2 boxes in refrigerator for pt to pick up. Pt states she thinks she is getting another yeast infection and is requesting rx for Diflucan. Has had vaginal itching and white discharge x 1 day. Monistat not effective. Also has head congestion and productive cough. Robitussin and allergy med has not been helping. Wants to know if we can call something in for her? Please advise.

## 2011-04-10 NOTE — Telephone Encounter (Signed)
Pt advised and scheduled f/u for 04/11/11 at 2:30pm.

## 2011-04-11 ENCOUNTER — Ambulatory Visit: Payer: Self-pay | Admitting: Family

## 2011-04-11 DIAGNOSIS — Z0289 Encounter for other administrative examinations: Secondary | ICD-10-CM

## 2011-05-10 ENCOUNTER — Telehealth: Payer: Self-pay | Admitting: *Deleted

## 2011-05-10 MED ORDER — INSULIN DETEMIR 100 UNIT/ML ~~LOC~~ SOLN: 40.0000 [IU] | Freq: Two times a day (BID) | SUBCUTANEOUS | Status: DC

## 2011-05-10 NOTE — Telephone Encounter (Signed)
Received message from pt requesting samples of Levemir flexpen. Spoke to pt and verified that she has not been able to be seen by [facility] yet, thinks it will be another month before they can see her. Advised pt she could pick up samples (2 pens) and she would need to call her pharmacy for pricing of medication. Pt voices understanding.

## 2011-06-05 ENCOUNTER — Telehealth: Payer: Self-pay | Admitting: Family

## 2011-06-05 ENCOUNTER — Other Ambulatory Visit: Payer: Self-pay | Admitting: Family

## 2011-06-05 NOTE — Telephone Encounter (Signed)
Call returned to patient,patient advised by front office staff no samples of Levemir available.

## 2011-06-05 NOTE — Telephone Encounter (Signed)
Rx refill sent to pharmacy. 

## 2011-06-05 NOTE — Telephone Encounter (Signed)
Patient states that she is out of levemir and would like samples.

## 2011-06-14 ENCOUNTER — Telehealth: Payer: Self-pay | Admitting: Family

## 2011-06-14 NOTE — Telephone Encounter (Signed)
Patient is requesting levemir samples

## 2011-06-15 NOTE — Telephone Encounter (Signed)
Attempted to reach pt and was unable to leave message. Pt will need appt to be seen in the office if she is not being seen by [facility]. We currently have a sample of Levemir that pt can pick up but she will not be able to get further samples until she is seen in the office.

## 2011-06-20 NOTE — Telephone Encounter (Signed)
Attempted to reach pt and was unable to leave message. Message states wireless customer is unavailable.

## 2011-06-21 MED ORDER — INSULIN DETEMIR 100 UNIT/ML ~~LOC~~ SOLN: 40.0000 [IU] | Freq: Two times a day (BID) | SUBCUTANEOUS | Status: DC

## 2011-06-21 NOTE — Telephone Encounter (Signed)
Spoke with pt and advised her that we do have a sample that she can pick up. Pt verified that she has not seen anyone re: her diabetes since her last visit with [place]. Advised pt we will need to see her in the office before she gets further samples or refills. Pt requests appt for Monday and appt scheduled for 2:15pm.

## 2011-06-26 ENCOUNTER — Ambulatory Visit: Payer: Self-pay | Admitting: Family

## 2011-06-26 DIAGNOSIS — Z0289 Encounter for other administrative examinations: Secondary | ICD-10-CM

## 2011-07-04 ENCOUNTER — Telehealth: Payer: Self-pay | Admitting: *Deleted

## 2011-07-04 NOTE — Telephone Encounter (Signed)
Received message from pt that she is unable to afford insulin injections as her cost would be $254. Pt requested to change to oral medication as it is more affordable for her. States that she will not be able to schedule an appt until 07/14/11 as cab fare per pt would be $30 one way. Advised pt that she would need to be seen before meds can be refilled or changed. Pt has received samples from our office and made multiple appts that she has not kept. Pt was advised each time that she needed to be seen to continue to receive samples and or refills.  Pt states she has not tried to get back in touch with [facility] to pursue care through them. Advised pt to try and find a doctor closer to her home for continued care as transportation is adding to her financial burden. Please advise.

## 2011-07-05 ENCOUNTER — Telehealth: Payer: Self-pay | Admitting: Family

## 2011-07-05 MED ORDER — INSULIN NPH (HUMAN) (ISOPHANE) 100 UNIT/ML ~~LOC~~ SUSP: 40.0000 [IU] | Freq: Two times a day (BID) | SUBCUTANEOUS | Status: DC

## 2011-07-05 NOTE — Telephone Encounter (Signed)
Pt returned my call and was notified of instructions below. Pt became very upset and requests a return call from [place].

## 2011-07-05 NOTE — Telephone Encounter (Signed)
Left message on pt's voicemail to call and arrange appt ASAP for further refills or med changes.

## 2011-07-05 NOTE — Telephone Encounter (Signed)
Spoke with pt. She tells me that she cannot afford to be seen until 2/1. She is requesting rx for oral agent for diabetes.  I advised her that she is unlikely to benefit much from oral agents and needs insulin at this point.   I offered to send refill to her pharmacy for levemir.  She tells me this will be 260$.  She cannot afford this.  I offered to send NPH to her pharmacy in place of levemir which will be cheaper for her until she can be seen on 2/1.  We discussed the importance of keeping her appointments. She verbalizes understanding.  I also stressed to her that I will not be able to provide any further medications of any kind, if she is not seen in follow up next week.  She verbalizes understanding.

## 2011-07-05 NOTE — Telephone Encounter (Signed)
I agree pt needs to be seen prior to further refills and/or med changes.

## 2011-07-21 ENCOUNTER — Other Ambulatory Visit: Payer: Self-pay | Admitting: Family

## 2011-07-21 ENCOUNTER — Ambulatory Visit (INDEPENDENT_AMBULATORY_CARE_PROVIDER_SITE_OTHER): Payer: Self-pay | Admitting: Family

## 2011-07-21 DIAGNOSIS — E039 Hypothyroidism, unspecified: Secondary | ICD-10-CM

## 2011-07-21 DIAGNOSIS — N189 Chronic kidney disease, unspecified: Secondary | ICD-10-CM

## 2011-07-21 DIAGNOSIS — E041 Nontoxic single thyroid nodule: Secondary | ICD-10-CM

## 2011-07-21 DIAGNOSIS — E119 Type 2 diabetes mellitus without complications: Secondary | ICD-10-CM

## 2011-07-21 DIAGNOSIS — B373 Candidiasis of vulva and vagina: Secondary | ICD-10-CM

## 2011-07-21 DIAGNOSIS — I10 Essential (primary) hypertension: Secondary | ICD-10-CM

## 2011-07-21 DIAGNOSIS — E079 Disorder of thyroid, unspecified: Secondary | ICD-10-CM

## 2011-07-21 MED ORDER — FLUCONAZOLE 150 MG PO TABS: 150.0000 mg | ORAL_TABLET | Freq: Once | ORAL | Status: AC

## 2011-07-21 MED ORDER — AMLODIPINE BESYLATE 5 MG PO TABS: 5.0000 mg | ORAL_TABLET | Freq: Every day | ORAL | Status: DC

## 2011-07-21 MED ORDER — HYDROCHLOROTHIAZIDE 25 MG PO TABS: 25.0000 mg | ORAL_TABLET | Freq: Every day | ORAL | Status: DC

## 2011-07-21 MED ORDER — INSULIN DETEMIR 100 UNIT/ML ~~LOC~~ SOLN: 40.0000 [IU] | Freq: Two times a day (BID) | SUBCUTANEOUS | Status: DC

## 2011-07-21 MED ORDER — LABETALOL HCL 200 MG PO TABS: 200.0000 mg | ORAL_TABLET | Freq: Two times a day (BID) | ORAL | Status: DC

## 2011-07-21 NOTE — Assessment & Plan Note (Signed)
Deteriorated. Resume meds.  

## 2011-07-21 NOTE — Assessment & Plan Note (Signed)
Very non-compliant patient.  Pt assistance for levemir was signed today, hand written rx attached for 3 month supply, 1 refill,  and given to pt to attach W2 and mail in to the company.  2 levemir pens samples provided to pt. We discussed the serious health risks of uncontrolled diabetes.  Ordered labs to be drawn today.  Pt told me that she had to pick up a family member and did not have time to complete labs.

## 2011-07-21 NOTE — Assessment & Plan Note (Signed)
BMET ordered. Stressed importance of returning ASAP to complete labs. Discussed risk of renal failure/dialysis if continued non-compliance with DM/HTN.

## 2011-07-21 NOTE — Progress Notes (Signed)
  Subjective:    Patient ID: Teresa Dalton, female    DOB: 1965/12/21, 46 y.o.   MRN: [id number]  HPI  Teresa Dalton is a 46 yr old female who presents today for follow up.  DM2- she has not been checking her sugar regular.  She ran out of insulin about 2 weeks ago and did not pick up the NPH which was called in.  She denies urinary frequency.   HTN-  She reports that she has not been taking her medications as prescribed.    Pt requests something for "yeast infection." Review of Systems See HPI  Past Medical History  Diagnosis Date  . Diabetes mellitus   . Hypertension   . Hyperlipidemia   . Thyroid disease     ? past problem  . Chronic kidney disease     renal insufficiency (1.6-1.9 creatinine)  . Increased frequency of headaches     History   Social History  . Marital Status: Single    Spouse Name: N/A    Number of Children: 2  . Years of Education: N/A   Occupational History  . [employer]    Social History Main Topics  . Smoking status: Current Everyday Smoker -- 17 years    Types: Cigarettes  . Smokeless tobacco: Not on file   Comment: 2-3 cigarettes daily- started at 46 yrs old  . Alcohol Use: No  . Drug Use: No  . Sexually Active: Not on file   Other Topics Concern  . Not on file   Social History Narrative   Regular exercise: noLives with one of her sons    Past Surgical History  Procedure Date  . Cesarean section     x 2  . Cystectomy 2001    thyroid    Family History  Problem Relation Age of Onset  . Diabetes Mother   . Heart disease Mother   . Hyperlipidemia Mother   . Cancer Father     lung, non-smoker. deceased 42yr  . Diabetes Sister   . Heart disease Brother     MI @ 47    No Known Allergies  Current Outpatient Prescriptions on File Prior to Visit  Medication Sig Dispense Refill  . glucose blood test strip Use as instructed  100 each  11  . Insulin Pen Needle 31G X 5 MM MISC Use with insulin injection two times  a day.  100 each  0  . Multiple Vitamins-Minerals (MULTIPLE VITAMINS/WOMENS PO) Take 1 tablet by mouth daily.          BP 160/90  Pulse 79  Temp(Src) 98 F (36.7 C) (Oral)  Resp 16  Wt 278 lb (126.1 kg)  SpO2 100%       Objective:   Physical Exam        Assessment & Plan:

## 2011-07-21 NOTE — Assessment & Plan Note (Signed)
Obtain TSH °

## 2011-07-21 NOTE — Patient Instructions (Signed)
Please return to the lab tomorrow for blood work.  Please follow up in 1 month.

## 2011-07-21 NOTE — Assessment & Plan Note (Signed)
Will plan to treat pt with diflucan.

## 2011-07-22 LAB — HEMOGLOBIN A1C
Hgb A1c MFr Bld: 11 % — ABNORMAL HIGH
Mean Plasma Glucose: 269 mg/dL — ABNORMAL HIGH

## 2011-07-22 LAB — BASIC METABOLIC PANEL
BUN: 13 mg/dL (ref 6–23)
Calcium: 10.1 mg/dL (ref 8.4–10.5)
Glucose, Bld: 124 mg/dL — ABNORMAL HIGH (ref 70–99)
Sodium: 139 mEq/L (ref 135–145)

## 2011-07-22 LAB — TSH: TSH: 2.167 u[IU]/mL (ref 0.350–4.500)

## 2011-07-25 ENCOUNTER — Telehealth: Payer: Self-pay | Admitting: Family

## 2011-07-25 ENCOUNTER — Telehealth: Payer: Self-pay | Admitting: *Deleted

## 2011-07-25 ENCOUNTER — Encounter: Payer: Self-pay | Admitting: Family

## 2011-07-25 NOTE — Telephone Encounter (Signed)
Left message on machine to return my call. 

## 2011-07-25 NOTE — Telephone Encounter (Signed)
Forms completed by Provider for pt assistance with Levemir. Rx and forms given to pt to attach proof of income and mail to [employer]. [id number]. [place], [place] [address].

## 2011-07-25 NOTE — Telephone Encounter (Signed)
Please call pt and let her know that her diabetes appears uncontrolled.  A1C is 11, we want it <7.  Thyroid function is normal. She should work hard on diabetic diet, exercise and resume insulin.  Kidney function is stable.

## 2011-07-26 NOTE — Telephone Encounter (Signed)
Notified pt. 

## 2011-08-09 ENCOUNTER — Ambulatory Visit: Payer: Self-pay | Admitting: Family

## 2011-08-09 DIAGNOSIS — Z0289 Encounter for other administrative examinations: Secondary | ICD-10-CM

## 2011-08-09 NOTE — Progress Notes (Signed)
  Subjective:    Patient ID: Teresa Dalton, female    DOB: 02/03/1966, 46 y.o.   MRN: [id number]  HPI    Review of Systems     Objective:   Physical Exam  Constitutional: She appears well-developed and well-nourished. No distress.  Cardiovascular: Normal rate and regular rhythm.   No murmur heard. Pulmonary/Chest: Effort normal and breath sounds normal. No respiratory distress. She has no wheezes. She has no rales. She exhibits no tenderness.  Musculoskeletal: She exhibits no edema.  Psychiatric: She has a normal mood and affect. Her behavior is normal. Judgment and thought content normal.          Assessment & Plan:

## 2011-08-14 ENCOUNTER — Other Ambulatory Visit: Payer: Self-pay | Admitting: *Deleted

## 2011-08-14 MED ORDER — INSULIN DETEMIR 100 UNIT/ML ~~LOC~~ SOLN: 40.0000 [IU] | Freq: Two times a day (BID) | SUBCUTANEOUS | Status: AC

## 2011-08-14 NOTE — Telephone Encounter (Signed)
Received call from pt requesting samples of Levemir. Advised pt we are currently out. Ok per Sandford Craze, NP to send Rx to her pharmacy and have her schedule her 1 month f/u from February. Notified pt and she requests rx be sent to [facility] on [address] in [place]. Pt scheduled f/u for 08/21/11 at 2:30pm. Requested status of pt assistance for Levemir. Pt states she mailed forms to the company about 3 weeks ago. Advised her it may take up to 1 month for request to be processed but she should receive a letter re: determination soon.  Refill sent for Levemir 24mL x no refills.

## 2011-08-15 ENCOUNTER — Telehealth: Payer: Self-pay | Admitting: *Deleted

## 2011-08-15 MED ORDER — INSULIN GLARGINE 100 UNIT/ML ~~LOC~~ SOLN: 40.0000 [IU] | Freq: Two times a day (BID) | SUBCUTANEOUS | Status: DC

## 2011-08-15 NOTE — Telephone Encounter (Signed)
Notified pt. She states she will pick up samples tomorrow. # 3 pens provided to pt.

## 2011-08-15 NOTE — Telephone Encounter (Signed)
Received message from pt stating she could not pick up Levemir rx as it is too expensive ($500). States she was told she could get vial of Novolin for $24. Spoke with pharmacist at [facility] and he states he cannot find a vial of insulin for $24. Novolin N would be $90 and Humulin N will be $78. We are out of Levemir samples and pt states she has mailed her pt assistance application but has not gotten a response yet. We have lantus samples. Could pt use that in place of Levemir?  Please advise.

## 2011-08-15 NOTE — Telephone Encounter (Signed)
OK to substitute lantus- dose will be same as levemir.

## 2011-08-21 ENCOUNTER — Ambulatory Visit: Payer: Self-pay | Admitting: Family

## 2011-08-22 ENCOUNTER — Ambulatory Visit: Payer: Self-pay | Admitting: Family

## 2011-08-22 DIAGNOSIS — Z0289 Encounter for other administrative examinations: Secondary | ICD-10-CM

## 2011-08-24 ENCOUNTER — Telehealth: Payer: Self-pay | Admitting: Family

## 2011-08-24 NOTE — Telephone Encounter (Signed)
Dismissal Letter sent by Certified Mail 08/24/2011  Dismissal Letter returned showing the patient moved left no address Unable to Forward 08/28/2011

## 2011-09-22 ENCOUNTER — Telehealth: Payer: Self-pay | Admitting: *Deleted

## 2011-09-22 ENCOUNTER — Telehealth: Payer: Self-pay | Admitting: Family

## 2011-09-22 NOTE — Telephone Encounter (Signed)
Notified pt that 2 Levemir flex pens are ready for her to pick up. She has been staying with her son temporarily at [address] but still resides at address on file. Notified pt of dismissal. Pt voices understanding.

## 2011-09-22 NOTE — Telephone Encounter (Signed)
Resent Dismissal Letter by Certified Mail to [address] [place], [address] on 56/21/3086  Dismissal Letter returned with Attempted Not Known on 09/28/2011

## 2011-09-22 NOTE — Telephone Encounter (Signed)
Per Sandford Craze, NP request please re-send dismissal letter to temporary address on file: [address]  [place], [place] [address].

## 2011-09-22 NOTE — Telephone Encounter (Signed)
Pls call pt and obtain her current address.  Please notify her that she has been dismissed from the practice for multiple no shows. Letter was mailed to her address on file and returned on 3/14.  OK to give her samples today as she did not see letter, but pls let her know that we will be available for emergency purposes only for the next 30 days and will not be able to provide her with any further refills after today.

## 2011-09-22 NOTE — Telephone Encounter (Signed)
Received voice message from pt requesting samples of Levemir. Pt cancelled march appt, r/s it then did not show for appt. Dismissal letter was mailed 08/24/11 and returned as non-deliverable, pt not at that address. Pt was given forms to apply for pt assistance with Levemir in February. Per phone note of march pt had stated that she mailed the forms to the company 3 weeks prior and was awaiting approval / denial. Please advise.

## 2011-09-29 ENCOUNTER — Telehealth: Payer: Self-pay | Admitting: Family

## 2011-09-29 NOTE — Telephone Encounter (Signed)
Please send letter via [place] Mail if not already completed. Thanks.

## 2011-10-04 ENCOUNTER — Telehealth: Payer: Self-pay | Admitting: Family

## 2011-10-04 NOTE — Telephone Encounter (Signed)
Message copied by Sandford Craze on Wed Oct 04, 2011  1:13 PM ------      Message from: Lilian Kapur      Created: Wed Oct 04, 2011 12:40 PM       Yes It was mailed back out on 09/29/2011.   I'm sorry I thought I had sent you the note stating that.            Synetta Fail      ----- Message -----         From: Sandford Craze, NP         Sent: 10/03/2011  10:26 AM           To: Alfonso Ramus,            Was her letter mailed via [place] mail? Thanks.             Callista Hoh

## 2011-10-05 ENCOUNTER — Other Ambulatory Visit: Payer: Self-pay | Admitting: *Deleted

## 2011-10-05 NOTE — Telephone Encounter (Signed)
No further samples to be given.  I recommend that she be evaluated in an urgent care or ER if she has not already established with another PCP.

## 2011-10-05 NOTE — Telephone Encounter (Signed)
Received voice message from pt stating she received the dismissal letter and is needing samples of Levemir (currently have 2 in office) and refill of Lantus to [facility].  Please advise.

## 2011-10-06 NOTE — Telephone Encounter (Signed)
Per verbal from Sandford Craze, NP ok to provide samples this time but no further samples/refills can be given.  Attempted to reach pt and left detailed message on voicemail that she can pick up levemir and lantus solostar until 5pm today. 2 samples of each in refrigerator for pt to pick up. Reminded pt she needs to establish with PMD ASAP to ensure continuity of care and advised of her need to see Urgent care until she can establish with a new MD.

## 2011-11-14 ENCOUNTER — Encounter (HOSPITAL_BASED_OUTPATIENT_CLINIC_OR_DEPARTMENT_OTHER): Payer: Self-pay

## 2011-11-14 ENCOUNTER — Emergency Department (HOSPITAL_BASED_OUTPATIENT_CLINIC_OR_DEPARTMENT_OTHER)
Admission: EM | Admit: 2011-11-14 | Discharge: 2011-11-14 | Disposition: A | Discharge status: Home / Self Care | Payer: Self-pay | Attending: Emergency Medicine | Admitting: Emergency Medicine

## 2011-11-14 DIAGNOSIS — F17200 Nicotine dependence, unspecified, uncomplicated: Secondary | ICD-10-CM | POA: Insufficient documentation

## 2011-11-14 DIAGNOSIS — Z794 Long term (current) use of insulin: Secondary | ICD-10-CM | POA: Insufficient documentation

## 2011-11-14 DIAGNOSIS — N189 Chronic kidney disease, unspecified: Secondary | ICD-10-CM | POA: Insufficient documentation

## 2011-11-14 DIAGNOSIS — K047 Periapical abscess without sinus: Secondary | ICD-10-CM | POA: Insufficient documentation

## 2011-11-14 DIAGNOSIS — E079 Disorder of thyroid, unspecified: Secondary | ICD-10-CM | POA: Insufficient documentation

## 2011-11-14 DIAGNOSIS — E119 Type 2 diabetes mellitus without complications: Secondary | ICD-10-CM | POA: Insufficient documentation

## 2011-11-14 DIAGNOSIS — Z79899 Other long term (current) drug therapy: Secondary | ICD-10-CM | POA: Insufficient documentation

## 2011-11-14 DIAGNOSIS — E785 Hyperlipidemia, unspecified: Secondary | ICD-10-CM | POA: Insufficient documentation

## 2011-11-14 DIAGNOSIS — I129 Hypertensive chronic kidney disease with stage 1 through stage 4 chronic kidney disease, or unspecified chronic kidney disease: Secondary | ICD-10-CM | POA: Insufficient documentation

## 2011-11-14 MED ORDER — OXYCODONE-ACETAMINOPHEN 7.5-325 MG PO TABS: 1.0000 | ORAL_TABLET | ORAL | Status: AC | PRN

## 2011-11-14 MED ORDER — PENICILLIN V POTASSIUM 500 MG PO TABS: 500.0000 mg | ORAL_TABLET | Freq: Four times a day (QID) | ORAL | Status: AC

## 2011-11-14 NOTE — Discharge Instructions (Signed)
Dental Abscess A dental abscess usually starts from an infected tooth. Antibiotic medicine and pain pills can be helpful, but dental infections require the attention of a dentist. Rinse around the infected area often with salt water (a pinch of salt in 8 oz of warm water). Do not apply heat to the outside of your face. See your dentist or oral surgeon as soon as possible.  SEEK IMMEDIATE MEDICAL CARE IF:  You have increasing, severe pain that is not relieved by medicine.   You or your child has an oral temperature above 102 F (38.9 C), not controlled by medicine.   Your baby is older than 3 months with a rectal temperature of 102 F (38.9 C) or higher.   Your baby is 3 months old or younger with a rectal temperature of 100.4 F (38 C) or higher.   You develop chills, severe headache, difficulty breathing, or trouble swallowing.   You have swelling in the neck or around the eye.  Document Released: 05/29/2005 Document Revised: 05/18/2011 Document Reviewed: 11/07/2006 [employer] Patient Information 2012 [employer]. 

## 2011-11-14 NOTE — ED Provider Notes (Signed)
History     CSN: [id number]  Arrival date & time 11/14/11  0821   First MD Initiated Contact with Patient 11/14/11 [phone]      Chief Complaint  Patient presents with  . Dental Pain  . Facial Swelling     HPI Patient complains of toothache in facial swelling in the last 5-7 days.  No difficulty breathing or swallowing.  Patient has history of diabetes hypertension and high cholesterol. Past Medical History  Diagnosis Date  . Diabetes mellitus   . Hypertension   . Hyperlipidemia   . Thyroid disease     ? past problem  . Chronic kidney disease     renal insufficiency (1.6-1.9 creatinine)  . Increased frequency of headaches     Past Surgical History  Procedure Date  . Cesarean section     x 2  . Cystectomy 2001    thyroid    Family History  Problem Relation Age of Onset  . Diabetes Mother   . Heart disease Mother   . Hyperlipidemia Mother   . Cancer Father     lung, non-smoker. deceased 64yr  . Diabetes Sister   . Heart disease Brother     MI @ 73    History  Substance Use Topics  . Smoking status: Current Everyday Smoker -- 17 years    Types: Cigarettes  . Smokeless tobacco: Not on file   Comment: 2-3 cigarettes daily- started at 46 yrs old  . Alcohol Use: No    OB History    Grav Para Term Preterm Abortions TAB SAB Ect Mult Living                  Review of Systems  All other systems reviewed and are negative.    Allergies  Review of patient's allergies indicates no known allergies.  Home Medications   Current Outpatient Rx  Name Route Sig Dispense Refill  . AMLODIPINE BESYLATE 5 MG PO TABS Oral Take 1 tablet (5 mg total) by mouth daily. 30 tablet 2  . CINNAMON PO  Take 4000mg  two times a day.    Marland Kitchen HYDROCHLOROTHIAZIDE 25 MG PO TABS Oral Take 25 mg by mouth every other day.    . INSULIN DETEMIR 100 UNIT/ML [place] SOLN Subcutaneous Inject 40 Units into the skin 2 (two) times daily. 24 mL 0  . INSULIN GLARGINE 100 UNIT/ML Delta SOLN Subcutaneous Inject  40 Units into the skin 2 (two) times daily. 3 pen 0    Lot [id number] Exp 01/2014  . INSULIN PEN NEEDLE 31G X 5 MM MISC  Use with insulin injection two times a day. 100 each 0  . LABETALOL HCL 200 MG PO TABS Oral Take 1 tablet (200 mg total) by mouth 2 (two) times daily. 60 tablet 2  . MULTIPLE VITAMINS/WOMENS PO Oral Take 1 tablet by mouth daily.      . OXYCODONE-ACETAMINOPHEN 7.5-325 MG PO TABS Oral Take 1 tablet by mouth every 4 (four) hours as needed for pain. 30 tablet 0  . PENICILLIN V POTASSIUM 500 MG PO TABS Oral Take 1 tablet (500 mg total) by mouth 4 (four) times daily. 40 tablet 0    BP 228/98  Pulse 82  Temp(Src) 99.3 F (37.4 C) (Oral)  Resp 24  Ht 5\' 5"  (1.651 m)  Wt 286 lb (129.729 kg)  BMI 47.59 kg/m2  SpO2 97%  Physical Exam  Nursing note and vitals reviewed. Constitutional: She is oriented to person, place, and time. She  appears well-developed and well-nourished. No distress.  HENT:  Head: Normocephalic and atraumatic.  Mouth/Throat:    Eyes: Pupils are equal, round, and reactive to light.  Neck: Normal range of motion.  Cardiovascular: Normal rate and intact distal pulses.   Pulmonary/Chest: No respiratory distress.  Abdominal: Normal appearance. She exhibits no distension.  Musculoskeletal: Normal range of motion.  Neurological: She is alert and oriented to person, place, and time. No cranial nerve deficit.  Skin: Skin is warm and dry. No rash noted.  Psychiatric: She has a normal mood and affect. Her behavior is normal.    ED Course  Procedures (including critical care time)  Labs Reviewed - No data to display No results found.   1. Tooth abscess       MDM         Nelia Shi, MD 11/20/11 [phone]

## 2011-11-14 NOTE — ED Notes (Signed)
Dental pain and facial swelling x 1 week.

## 2011-12-22 ENCOUNTER — Telehealth: Payer: Self-pay | Admitting: Family

## 2011-12-22 NOTE — Telephone Encounter (Signed)
Refill- labetalol 200mg  tab. Take one tablet by mouth twice daily. Qty 60 last fill 5.24.13

## 2011-12-25 NOTE — Telephone Encounter (Signed)
Left message on pharmacy voicemail that pt is no longer being seen by our Practice and to contact pt for current PMD.

## 2014-04-20 ENCOUNTER — Emergency Department (HOSPITAL_COMMUNITY)
Admission: EM | Admit: 2014-04-20 | Discharge: 2014-04-21 | Disposition: A | Discharge status: Home / Self Care | Payer: Self-pay | Attending: Emergency Medicine | Admitting: Emergency Medicine

## 2014-04-20 ENCOUNTER — Encounter (HOSPITAL_COMMUNITY): Payer: Self-pay | Admitting: Emergency Medicine

## 2014-04-20 DIAGNOSIS — I129 Hypertensive chronic kidney disease with stage 1 through stage 4 chronic kidney disease, or unspecified chronic kidney disease: Secondary | ICD-10-CM | POA: Insufficient documentation

## 2014-04-20 DIAGNOSIS — Z79899 Other long term (current) drug therapy: Secondary | ICD-10-CM | POA: Insufficient documentation

## 2014-04-20 DIAGNOSIS — K088 Other specified disorders of teeth and supporting structures: Secondary | ICD-10-CM | POA: Insufficient documentation

## 2014-04-20 DIAGNOSIS — E119 Type 2 diabetes mellitus without complications: Secondary | ICD-10-CM | POA: Insufficient documentation

## 2014-04-20 DIAGNOSIS — Z8639 Personal history of other endocrine, nutritional and metabolic disease: Secondary | ICD-10-CM | POA: Insufficient documentation

## 2014-04-20 DIAGNOSIS — N189 Chronic kidney disease, unspecified: Secondary | ICD-10-CM | POA: Insufficient documentation

## 2014-04-20 DIAGNOSIS — K0510 Chronic gingivitis, plaque induced: Secondary | ICD-10-CM | POA: Insufficient documentation

## 2014-04-20 DIAGNOSIS — K0381 Cracked tooth: Secondary | ICD-10-CM | POA: Insufficient documentation

## 2014-04-20 DIAGNOSIS — I160 Hypertensive urgency: Secondary | ICD-10-CM

## 2014-04-20 DIAGNOSIS — Z720 Tobacco use: Secondary | ICD-10-CM | POA: Insufficient documentation

## 2014-04-20 DIAGNOSIS — E079 Disorder of thyroid, unspecified: Secondary | ICD-10-CM | POA: Insufficient documentation

## 2014-04-20 DIAGNOSIS — Z794 Long term (current) use of insulin: Secondary | ICD-10-CM | POA: Insufficient documentation

## 2014-04-20 NOTE — ED Notes (Signed)
Pt. Reports left upper tooth pain onset Saturday  unrelieved by OTC pain medications , hypertensive at triage .

## 2014-04-21 LAB — CBC WITH DIFFERENTIAL/PLATELET
Basophils Absolute: 0 10*3/uL (ref 0.0–0.1)
Basophils Relative: 0 % (ref 0–1)
Eosinophils Absolute: 0.4 10*3/uL (ref 0.0–0.7)
Eosinophils Relative: 3 % (ref 0–5)
HEMATOCRIT: 39.7 % (ref 36.0–46.0)
HEMOGLOBIN: 13 g/dL (ref 12.0–15.0)
LYMPHS ABS: 2.4 10*3/uL (ref 0.7–4.0)
LYMPHS PCT: 19 % (ref 12–46)
MCH: 27.7 pg (ref 26.0–34.0)
MCHC: 32.7 g/dL (ref 30.0–36.0)
MCV: 84.6 fL (ref 78.0–100.0)
MONO ABS: 0.9 10*3/uL (ref 0.1–1.0)
MONOS PCT: 7 % (ref 3–12)
NEUTROS ABS: 9.2 10*3/uL — AB (ref 1.7–7.7)
NEUTROS PCT: 71 % (ref 43–77)
Platelets: 361 10*3/uL (ref 150–400)
RBC: 4.69 MIL/uL (ref 3.87–5.11)
RDW: 14.1 % (ref 11.5–15.5)
WBC: 12.9 10*3/uL — ABNORMAL HIGH (ref 4.0–10.5)

## 2014-04-21 LAB — COMPREHENSIVE METABOLIC PANEL
ALBUMIN: 3.6 g/dL (ref 3.5–5.2)
ALK PHOS: 136 U/L — AB (ref 39–117)
ALT: 11 U/L (ref 0–35)
ANION GAP: 20 — AB (ref 5–15)
AST: 14 U/L (ref 0–37)
BUN: 29 mg/dL — AB (ref 6–23)
CHLORIDE: 100 meq/L (ref 96–112)
CO2: 18 meq/L — AB (ref 19–32)
CREATININE: 3.03 mg/dL — AB (ref 0.50–1.10)
Calcium: 10.1 mg/dL (ref 8.4–10.5)
GFR, EST AFRICAN AMERICAN: 20 mL/min — AB (ref >90)
GFR, EST NON AFRICAN AMERICAN: 17 mL/min — AB (ref >90)
GLUCOSE: 192 mg/dL — AB (ref 70–99)
POTASSIUM: 4.2 meq/L (ref 3.7–5.3)
Sodium: 138 mEq/L (ref 137–147)
Total Protein: 8.8 g/dL — ABNORMAL HIGH (ref 6.0–8.3)

## 2014-04-21 LAB — TROPONIN I

## 2014-04-21 MED ORDER — CLONIDINE HCL 0.2 MG PO TABS
0.2000 mg | ORAL_TABLET | Freq: Once | ORAL | Status: AC
  Administered 2014-04-21: 0.2 mg via ORAL
  Filled 2014-04-21: qty 1

## 2014-04-21 MED ORDER — PENICILLIN V POTASSIUM 500 MG PO TABS: 500.0000 mg | ORAL_TABLET | Freq: Three times a day (TID) | ORAL | Status: DC

## 2014-04-21 MED ORDER — LABETALOL HCL 200 MG PO TABS
200.0000 mg | ORAL_TABLET | Freq: Once | ORAL | Status: AC
  Administered 2014-04-21: 200 mg via ORAL
  Filled 2014-04-21: qty 1

## 2014-04-21 MED ORDER — HYDROCODONE-ACETAMINOPHEN 5-325 MG PO TABS
2.0000 | ORAL_TABLET | Freq: Once | ORAL | Status: AC
  Administered 2014-04-21: 2 via ORAL
  Filled 2014-04-21: qty 2

## 2014-04-21 MED ORDER — LABETALOL HCL 5 MG/ML IV SOLN
2.0000 mg/min | INTRAVENOUS | Status: DC
  Filled 2014-04-21: qty 100

## 2014-04-21 MED ORDER — CLONIDINE HCL 0.2 MG PO TABS: 0.2000 mg | ORAL_TABLET | Freq: Two times a day (BID) | ORAL | Status: DC

## 2014-04-21 MED ORDER — HYDROCODONE-ACETAMINOPHEN 5-325 MG PO TABS: 1.0000 | ORAL_TABLET | Freq: Four times a day (QID) | ORAL | Status: DC | PRN

## 2014-04-21 NOTE — Discharge Instructions (Signed)
Penicillin as prescribed.  Hydrocodone as prescribed as needed for pain.  Taking clonidine as prescribed for your blood pressure.  Nephrology will call you to arrange a follow-up appointment to discuss your renal function.   Hypertension Hypertension, commonly called high blood pressure, is when the force of blood pumping through your arteries is too strong. Your arteries are the blood vessels that carry blood from your heart throughout your body. A blood pressure reading consists of a higher number over a lower number, such as 110/72. The higher number (systolic) is the pressure inside your arteries when your heart pumps. The lower number (diastolic) is the pressure inside your arteries when your heart relaxes. Ideally you want your blood pressure below 120/80. Hypertension forces your heart to work harder to pump blood. Your arteries may become narrow or stiff. Having hypertension puts you at risk for heart disease, stroke, and other problems.  RISK FACTORS Some risk factors for high blood pressure are controllable. Others are not.  Risk factors you cannot control include:   Race. You may be at higher risk if you are African American.  Age. Risk increases with age.  Gender. Men are at higher risk than women before age 46 years. After age 46, women are at higher risk than men. Risk factors you can control include:  Not getting enough exercise or physical activity.  Being overweight.  Getting too much fat, sugar, calories, or salt in your diet.  Drinking too much alcohol. SIGNS AND SYMPTOMS Hypertension does not usually cause signs or symptoms. Extremely high blood pressure (hypertensive crisis) may cause headache, anxiety, shortness of breath, and nosebleed. DIAGNOSIS  To check if you have hypertension, your health care provider will measure your blood pressure while you are seated, with your arm held at the level of your heart. It should be measured at least twice using the same  arm. Certain conditions can cause a difference in blood pressure between your right and left arms. A blood pressure reading that is higher than normal on one occasion does not mean that you need treatment. If one blood pressure reading is high, ask your health care provider about having it checked again. TREATMENT  Treating high blood pressure includes making lifestyle changes and possibly taking medicine. Living a healthy lifestyle can help lower high blood pressure. You may need to change some of your habits. Lifestyle changes may include:  Following the DASH diet. This diet is high in fruits, vegetables, and whole grains. It is low in salt, red meat, and added sugars.  Getting at least 2 hours of brisk physical activity every week.  Losing weight if necessary.  Not smoking.  Limiting alcoholic beverages.  Learning ways to reduce stress. If lifestyle changes are not enough to get your blood pressure under control, your health care provider may prescribe medicine. You may need to take more than one. Work closely with your health care provider to understand the risks and benefits. HOME CARE INSTRUCTIONS  Have your blood pressure rechecked as directed by your health care provider.   Take medicines only as directed by your health care provider. Follow the directions carefully. Blood pressure medicines must be taken as prescribed. The medicine does not work as well when you skip doses. Skipping doses also puts you at risk for problems.   Do not smoke.   Monitor your blood pressure at home as directed by your health care provider. SEEK MEDICAL CARE IF:   You think you are having a reaction to  medicines taken.  You have recurrent headaches or feel dizzy.  You have swelling in your ankles.  You have trouble with your vision. SEEK IMMEDIATE MEDICAL CARE IF:  You develop a severe headache or confusion.  You have unusual weakness, numbness, or feel faint.  You have severe chest  or abdominal pain.  You vomit repeatedly.  You have trouble breathing. MAKE SURE YOU:   Understand these instructions.  Will watch your condition.  Will get help right away if you are not doing well or get worse. Document Released: 05/29/2005 Document Revised: 10/13/2013 Document Reviewed: 03/21/2013 [facility] Patient Information 2015 [place], [place]. This information is not intended to replace advice given to you by your health care provider. Make sure you discuss any questions you have with your health care provider.  Chronic Kidney Disease Chronic kidney disease occurs when the kidneys are damaged over a long period. The kidneys are two organs that lie on either side of the spine between the middle of the back and the front of the abdomen. The kidneys:   Remove wastes and extra water from the blood.   Produce important hormones. These help keep bones strong, regulate blood pressure, and help create red blood cells.   Balance the fluids and chemicals in the blood and tissues. A small amount of kidney damage may not cause problems, but a large amount of damage may make it difficult or impossible for the kidneys to work the way they should. If steps are not taken to slow down the kidney damage or stop it from getting worse, the kidneys may stop working permanently. Most of the time, chronic kidney disease does not go away. However, it can often be controlled, and those with the disease can usually live normal lives. CAUSES  The most common causes of chronic kidney disease are diabetes and high blood pressure (hypertension). Chronic kidney disease may also be caused by:   Diseases that cause the kidneys' filters to become inflamed.   Diseases that affect the immune system.   Genetic diseases.   Medicines that damage the kidneys, such as anti-inflammatory medicines.  Poisoning or exposure to toxic substances.   A reoccurring kidney or urinary infection.   A problem with  urine flow. This may be caused by:   Cancer.   Kidney stones.   An enlarged prostate in males. SIGNS AND SYMPTOMS  Because the kidney damage in chronic kidney disease occurs slowly, symptoms develop slowly and may not be obvious until the kidney damage becomes severe. A person may have a kidney disease for years without showing any symptoms. Symptoms can include:   Swelling (edema) of the legs, ankles, or feet.   Tiredness (lethargy).   Nausea or vomiting.   Confusion.   Problems with urination, such as:   Decreased urine production.   Frequent urination, especially at night.   Frequent accidents in children who are potty trained.   Muscle twitches and cramps.   Shortness of breath.  Weakness.   Persistent itchiness.   Loss of appetite.  Metallic taste in the mouth.  Trouble sleeping.  Slowed development in children.  Short stature in children. DIAGNOSIS  Chronic kidney disease may be detected and diagnosed by tests, including blood, urine, imaging, or kidney biopsy tests.  TREATMENT  Most chronic kidney diseases cannot be cured. Treatment usually involves relieving symptoms and preventing or slowing the progression of the disease. Treatment may include:   A special diet. You may need to avoid alcohol and foods thatare  salty and high in potassium.   Medicines. These may:   Lower blood pressure.   Relieve anemia.   Relieve swelling.   Protect the bones. HOME CARE INSTRUCTIONS   Follow your prescribed diet.   Take medicines only as directed by your health care provider. Do not take any new medicines (prescription, over-the-counter, or nutritional supplements) unless approved by your health care provider. Many medicines can worsen your kidney damage or need to have the dose adjusted.   Quit smoking if you smoke. Talk to your health care provider about a smoking cessation program.   Keep all follow-up visits as directed by your  health care provider. SEEK IMMEDIATE MEDICAL CARE IF:  Your symptoms get worse or you develop new symptoms.   You develop symptoms of end-stage kidney disease. These include:   Headaches.   Abnormally dark or light skin.   Numbness in the hands or feet.   Easy bruising.   Frequent hiccups.   Menstruation stops.   You have a fever.   You have decreased urine production.   You havepain or bleeding when urinating. MAKE SURE YOU:  Understand these instructions.  Will watch your condition.  Will get help right away if you are not doing well or get worse. FOR MORE INFORMATION   American Association of Kidney Patients: ResidentialShow.iswww.aakp.org  National Kidney Foundation: www.kidney.org  American Kidney Fund: FightingMatch.com.eewww.akfinc.org  [facility]: www.lifeoptions.org and www.kidneyschool.org Document Released: 03/07/2008 Document Revised: 10/13/2013 Document Reviewed: 01/26/2012 [facility] Patient Information 2015 [employer] [place], [place]. This information is not intended to replace advice given to you by your health care provider. Make sure you discuss any questions you have with your health care provider.

## 2014-04-21 NOTE — ED Provider Notes (Signed)
CSN: [id number]     Arrival date & time 04/20/14  2305 History   First MD Initiated Contact with Patient 04/20/14 2343     Chief Complaint  Patient presents with  . Dental Pain  . Hypertension     (Consider location/radiation/quality/duration/timing/severity/associated sxs/prior Treatment) HPI Comments: Patient is a 48 year old female with history of diabetes and hypertension. She presents with complaints of left upper front tooth pain. One of her teeth cracked off several days ago and has been more uncomfortable since that time. She is concerned she is forming an abscess as there is swelling in this area. She denies any fevers or chills. She does report to "not feeling well" and is concerned her blood pressure may be high.  Patient is a 48 y.o. female presenting with tooth pain and hypertension. The history is provided by the patient.  Dental Pain Location:  Upper Upper teeth location:  9/LU central incisor Quality:  Throbbing Severity:  Moderate Onset quality:  Gradual Duration:  2 days Timing:  Constant Progression:  Worsening Chronicity:  New Relieved by:  Nothing Worsened by:  Nothing tried Ineffective treatments:  None tried Hypertension    Past Medical History  Diagnosis Date  . Diabetes mellitus   . Hypertension   . Hyperlipidemia   . Thyroid disease     ? past problem  . Chronic kidney disease     renal insufficiency (1.6-1.9 creatinine)  . Increased frequency of headaches    Past Surgical History  Procedure Laterality Date  . Cesarean section      x 2  . Cystectomy  2001    thyroid   Family History  Problem Relation Age of Onset  . Diabetes Mother   . Heart disease Mother   . Hyperlipidemia Mother   . Cancer Father     lung, non-smoker. deceased 36101yr  . Diabetes Sister   . Heart disease Brother     MI @ 3444   History  Substance Use Topics  . Smoking status: Current Every Day Smoker -- 17 years    Types: Cigarettes  . Smokeless tobacco: Not on  file     Comment: 2-3 cigarettes daily- started at 48 yrs old  . Alcohol Use: No   OB History    No data available     Review of Systems  All other systems reviewed and are negative.     Allergies  Review of patient's allergies indicates no known allergies.  Home Medications   Prior to Admission medications   Medication Sig Start Date End Date Taking? Authorizing Provider  amLODipine (NORVASC) 5 MG tablet Take 1 tablet (5 mg total) by mouth daily. 07/21/11   Sandford CrazeMelissa O'Sullivan, NP  CINNAMON PO Take 4000mg  two times a day.    Historical Provider, MD  hydrochlorothiazide (HYDRODIURIL) 25 MG tablet Take 25 mg by mouth every other day. 07/21/11   Sandford CrazeMelissa O'Sullivan, NP  insulin detemir (LEVEMIR FLEXPEN) 100 UNIT/ML injection Inject 40 Units into the skin 2 (two) times daily. 08/14/11   Sandford CrazeMelissa O'Sullivan, NP  insulin glargine (LANTUS SOLOSTAR) 100 UNIT/ML injection Inject 40 Units into the skin 2 (two) times daily. 08/15/11 08/14/12  Sandford CrazeMelissa O'Sullivan, NP  Insulin Pen Needle 31G X 5 MM MISC Use with insulin injection two times a day. 03/21/11   Sandford CrazeMelissa O'Sullivan, NP  labetalol (NORMODYNE) 200 MG tablet Take 1 tablet (200 mg total) by mouth 2 (two) times daily. 07/21/11   Sandford CrazeMelissa O'Sullivan, NP  Multiple Vitamins-Minerals (MULTIPLE VITAMINS/WOMENS PO) Take  1 tablet by mouth daily.      Historical Provider, MD   BP 220/130 mmHg  Pulse 94  Temp(Src) 98.7 F (37.1 C) (Oral)  Resp 24  Ht 5\' 5"  (1.651 m)  Wt 240 lb (108.863 kg)  BMI 39.94 kg/m2  SpO2 98%  LMP 04/19/2014 Physical Exam  Constitutional: She is oriented to person, place, and time. She appears well-developed and well-nourished. No distress.  HENT:  Head: Normocephalic and atraumatic.  The left upper incisor is noted to be cracked off at the gumline. There is mild gingival inflammation, however no swelling and would represent a definite abscess. There is no facial swelling.  Neck: Normal range of motion. Neck supple.   Cardiovascular: Normal rate and regular rhythm.  Exam reveals no gallop and no friction rub.   No murmur heard. Pulmonary/Chest: Effort normal and breath sounds normal. No respiratory distress. She has no wheezes.  Abdominal: Soft. Bowel sounds are normal. She exhibits no distension. There is no tenderness.  Musculoskeletal: Normal range of motion.  Neurological: She is alert and oriented to person, place, and time.  Skin: Skin is warm and dry. She is not diaphoretic.  Nursing note and vitals reviewed.   ED Course  Procedures (including critical care time) Labs Review Labs Reviewed  CBC WITH DIFFERENTIAL - Abnormal; Notable for the following:    WBC 12.9 (*)    Neutro Abs 9.2 (*)    All other components within normal limits  COMPREHENSIVE METABOLIC PANEL    Imaging Review No results found.   EKG Interpretation   Date/Time:  Monday April 20 2014 23:43:31 EST Ventricular Rate:  88 PR Interval:  182 QRS Duration: 93 QT Interval:  389 QTC Calculation: 471 R Axis:   63 Text Interpretation:  Sinus rhythm Borderline T abnormalities, lateral  leads Confirmed by DELOS  MD, Roberta Angell ([id number]) on 04/20/2014 11:47:59 PM      MDM   Final diagnoses:  None    Patient initially presents with complaints of dental pain. While she was here she was found to be markedly hypertensive. Laboratory studies reveal a creatinine that is elevated slightly above her baseline, however other studies are unremarkable. She was given 200 mg of labetalol without much response. Shortly thereafter, she was given 0.2 mg of oral clonidine. Shortly thereafter her blood pressure began to improve. I discussed the case with Dr. Toniann FailKakrakandy who asked me to consult with nephrology regarding her worsening creatinine. They did not feel as though this required aggressive therapy and felt she was fine for follow-up. She will be prescribed clonidine to take in addition to her current antihypertensive regimen. Nephrology  took her name and medical record number and will arrange a follow-up appointment. She is to keep a record of her blood pressures at home so that she can present this to her primary Dr. And nephrologist at follow-up.    Geoffery Lyonsouglas Nefertari Rebman, MD 04/22/14 [id number]

## 2014-04-27 ENCOUNTER — Other Ambulatory Visit: Payer: Self-pay | Admitting: Nephrology

## 2014-04-27 DIAGNOSIS — N184 Chronic kidney disease, stage 4 (severe): Secondary | ICD-10-CM

## 2014-05-04 ENCOUNTER — Ambulatory Visit
Admission: RE | Admit: 2014-05-04 | Discharge: 2014-05-04 | Disposition: A | Discharge status: Home / Self Care | Payer: No Typology Code available for payment source | Source: Ambulatory Visit | Attending: Nephrology | Admitting: Nephrology

## 2014-05-04 ENCOUNTER — Other Ambulatory Visit: Payer: Self-pay

## 2014-05-04 DIAGNOSIS — N184 Chronic kidney disease, stage 4 (severe): Secondary | ICD-10-CM

## 2015-11-27 ENCOUNTER — Encounter (HOSPITAL_COMMUNITY): Payer: Self-pay

## 2015-11-27 ENCOUNTER — Emergency Department (HOSPITAL_COMMUNITY)
Admission: EM | Admit: 2015-11-27 | Discharge: 2015-11-27 | Disposition: A | Discharge status: Home / Self Care | Payer: Self-pay | Attending: Emergency Medicine | Admitting: Emergency Medicine

## 2015-11-27 DIAGNOSIS — F17210 Nicotine dependence, cigarettes, uncomplicated: Secondary | ICD-10-CM | POA: Insufficient documentation

## 2015-11-27 DIAGNOSIS — I129 Hypertensive chronic kidney disease with stage 1 through stage 4 chronic kidney disease, or unspecified chronic kidney disease: Secondary | ICD-10-CM | POA: Insufficient documentation

## 2015-11-27 DIAGNOSIS — N189 Chronic kidney disease, unspecified: Secondary | ICD-10-CM | POA: Insufficient documentation

## 2015-11-27 DIAGNOSIS — Z794 Long term (current) use of insulin: Secondary | ICD-10-CM | POA: Insufficient documentation

## 2015-11-27 DIAGNOSIS — E1122 Type 2 diabetes mellitus with diabetic chronic kidney disease: Secondary | ICD-10-CM | POA: Insufficient documentation

## 2015-11-27 DIAGNOSIS — I10 Essential (primary) hypertension: Secondary | ICD-10-CM

## 2015-11-27 DIAGNOSIS — Z7984 Long term (current) use of oral hypoglycemic drugs: Secondary | ICD-10-CM | POA: Insufficient documentation

## 2015-11-27 LAB — COMPREHENSIVE METABOLIC PANEL
ALBUMIN: 3.3 g/dL — AB (ref 3.5–5.0)
ALT: 12 U/L — ABNORMAL LOW (ref 14–54)
ANION GAP: 8 (ref 5–15)
AST: 13 U/L — AB (ref 15–41)
Alkaline Phosphatase: 145 U/L — ABNORMAL HIGH (ref 38–126)
BUN: 23 mg/dL — AB (ref 6–20)
CO2: 23 mmol/L (ref 22–32)
Calcium: 9.4 mg/dL (ref 8.9–10.3)
Chloride: 105 mmol/L (ref 101–111)
Creatinine, Ser: 3.98 mg/dL — ABNORMAL HIGH (ref 0.44–1.00)
GFR calc Af Amer: 14 mL/min — ABNORMAL LOW (ref >60)
GFR calc non Af Amer: 12 mL/min — ABNORMAL LOW (ref >60)
GLUCOSE: 239 mg/dL — AB (ref 65–99)
Potassium: 4 mmol/L (ref 3.5–5.1)
SODIUM: 136 mmol/L (ref 135–145)
Total Bilirubin: 0.5 mg/dL (ref 0.3–1.2)
Total Protein: 7.6 g/dL (ref 6.5–8.1)

## 2015-11-27 LAB — CBC WITH DIFFERENTIAL/PLATELET
BASOS ABS: 0 10*3/uL (ref 0.0–0.1)
Basophils Relative: 0 %
EOS PCT: 3 %
Eosinophils Absolute: 0.3 10*3/uL (ref 0.0–0.7)
HCT: 38.1 % (ref 36.0–46.0)
Hemoglobin: 12 g/dL (ref 12.0–15.0)
LYMPHS PCT: 20 %
Lymphs Abs: 2 10*3/uL (ref 0.7–4.0)
MCH: 27.6 pg (ref 26.0–34.0)
MCHC: 31.5 g/dL (ref 30.0–36.0)
MCV: 87.6 fL (ref 78.0–100.0)
MONO ABS: 0.5 10*3/uL (ref 0.1–1.0)
MONOS PCT: 5 %
Neutro Abs: 7.1 10*3/uL (ref 1.7–7.7)
Neutrophils Relative %: 72 %
Platelets: 266 10*3/uL (ref 150–400)
RBC: 4.35 MIL/uL (ref 3.87–5.11)
RDW: 14.4 % (ref 11.5–15.5)
WBC: 9.9 10*3/uL (ref 4.0–10.5)

## 2015-11-27 LAB — URINALYSIS, ROUTINE W REFLEX MICROSCOPIC
Bilirubin Urine: NEGATIVE
GLUCOSE, UA: 500 mg/dL — AB
KETONES UR: NEGATIVE mg/dL
Nitrite: NEGATIVE
PH: 7 (ref 5.0–8.0)
Protein, ur: >300 mg/dL — AB
Specific Gravity, Urine: 1.013 (ref 1.005–1.030)

## 2015-11-27 LAB — URINE MICROSCOPIC-ADD ON

## 2015-11-27 MED ORDER — CLONIDINE HCL 0.2 MG PO TABS
0.3000 mg | ORAL_TABLET | Freq: Once | ORAL | Status: AC
  Administered 2015-11-27: 0.3 mg via ORAL
  Filled 2015-11-27: qty 1

## 2015-11-27 MED ORDER — LOSARTAN POTASSIUM 50 MG PO TABS: 50.0000 mg | ORAL_TABLET | Freq: Every day | ORAL | Status: AC

## 2015-11-27 MED ORDER — GLIMEPIRIDE 4 MG PO TABS: 4.0000 mg | ORAL_TABLET | Freq: Every day | ORAL | Status: AC

## 2015-11-27 MED ORDER — AMLODIPINE BESYLATE 5 MG PO TABS
5.0000 mg | ORAL_TABLET | Freq: Once | ORAL | Status: AC
  Administered 2015-11-27: 5 mg via ORAL
  Filled 2015-11-27: qty 1

## 2015-11-27 MED ORDER — CLONIDINE HCL 0.3 MG PO TABS: 0.3000 mg | ORAL_TABLET | Freq: Two times a day (BID) | ORAL | Status: AC

## 2015-11-27 MED ORDER — GLIMEPIRIDE 4 MG PO TABS
4.0000 mg | ORAL_TABLET | Freq: Once | ORAL | Status: AC
  Administered 2015-11-27: 4 mg via ORAL
  Filled 2015-11-27: qty 1

## 2015-11-27 MED ORDER — SIMVASTATIN 20 MG PO TABS: 20.0000 mg | ORAL_TABLET | Freq: Every day | ORAL | Status: AC

## 2015-11-27 MED ORDER — AMLODIPINE BESYLATE 5 MG PO TABS: 5.0000 mg | ORAL_TABLET | Freq: Every day | ORAL | Status: DC

## 2015-11-27 MED ORDER — LOSARTAN POTASSIUM 50 MG PO TABS
50.0000 mg | ORAL_TABLET | Freq: Once | ORAL | Status: AC
  Administered 2015-11-27: 50 mg via ORAL
  Filled 2015-11-27: qty 1

## 2015-11-27 NOTE — ED Notes (Addendum)
Patient here requesting refill on BP medication, out x 2 days. Has no primary MD. States she takes 3 different meds to control same

## 2015-11-27 NOTE — Discharge Instructions (Signed)
Obtain a primary care provider and follow up with them within 3-4 days regarding your visit to the emergency department today. Take all your home medications as prescribed.  Return to emergency department if you experience headache, visual changes, chest pain, shortness of breath, dizziness, sweating, nausea, vomiting.  Hypertension Hypertension, commonly called high blood pressure, is when the force of blood pumping through your arteries is too strong. Your arteries are the blood vessels that carry blood from your heart throughout your body. A blood pressure reading consists of a higher number over a lower number, such as 110/72. The higher number (systolic) is the pressure inside your arteries when your heart pumps. The lower number (diastolic) is the pressure inside your arteries when your heart relaxes. Ideally you want your blood pressure below 120/80. Hypertension forces your heart to work harder to pump blood. Your arteries may become narrow or stiff. Having untreated or uncontrolled hypertension can cause heart attack, stroke, kidney disease, and other problems. RISK FACTORS Some risk factors for high blood pressure are controllable. Others are not.  Risk factors you cannot control include:   Race. You may be at higher risk if you are African American.  Age. Risk increases with age.  Gender. Men are at higher risk than women before age 72 years. After age 84, women are at higher risk than men. Risk factors you can control include:  Not getting enough exercise or physical activity.  Being overweight.  Getting too much fat, sugar, calories, or salt in your diet.  Drinking too much alcohol. SIGNS AND SYMPTOMS Hypertension does not usually cause signs or symptoms. Extremely high blood pressure (hypertensive crisis) may cause headache, anxiety, shortness of breath, and nosebleed. DIAGNOSIS To check if you have hypertension, your health care provider will measure your blood pressure  while you are seated, with your arm held at the level of your heart. It should be measured at least twice using the same arm. Certain conditions can cause a difference in blood pressure between your right and left arms. A blood pressure reading that is higher than normal on one occasion does not mean that you need treatment. If it is not clear whether you have high blood pressure, you may be asked to return on a different day to have your blood pressure checked again. Or, you may be asked to monitor your blood pressure at home for 1 or more weeks. TREATMENT Treating high blood pressure includes making lifestyle changes and possibly taking medicine. Living a healthy lifestyle can help lower high blood pressure. You may need to change some of your habits. Lifestyle changes may include:  Following the DASH diet. This diet is high in fruits, vegetables, and whole grains. It is low in salt, red meat, and added sugars.  Keep your sodium intake below 2,300 mg per day.  Getting at least 30-45 minutes of aerobic exercise at least 4 times per week.  Losing weight if necessary.  Not smoking.  Limiting alcoholic beverages.  Learning ways to reduce stress. Your health care provider may prescribe medicine if lifestyle changes are not enough to get your blood pressure under control, and if one of the following is true:  You are 92-12 years of age and your systolic blood pressure is above 140.  You are 34 years of age or older, and your systolic blood pressure is above 150.  Your diastolic blood pressure is above 90.  You have diabetes, and your systolic blood pressure is over 140 or your diastolic  blood pressure is over 90.  You have kidney disease and your blood pressure is above 140/90.  You have heart disease and your blood pressure is above 140/90. Your personal target blood pressure may vary depending on your medical conditions, your age, and other factors. HOME CARE INSTRUCTIONS  Have your  blood pressure rechecked as directed by your health care provider.   Take medicines only as directed by your health care provider. Follow the directions carefully. Blood pressure medicines must be taken as prescribed. The medicine does not work as well when you skip doses. Skipping doses also puts you at risk for problems.  Do not smoke.   Monitor your blood pressure at home as directed by your health care provider. SEEK MEDICAL CARE IF:   You think you are having a reaction to medicines taken.  You have recurrent headaches or feel dizzy.  You have swelling in your ankles.  You have trouble with your vision. SEEK IMMEDIATE MEDICAL CARE IF:  You develop a severe headache or confusion.  You have unusual weakness, numbness, or feel faint.  You have severe chest or abdominal pain.  You vomit repeatedly.  You have trouble breathing. MAKE SURE YOU:   Understand these instructions.  Will watch your condition.  Will get help right away if you are not doing well or get worse.   This information is not intended to replace advice given to you by your health care provider. Make sure you discuss any questions you have with your health care provider.   Document Released: 05/29/2005 Document Revised: 10/13/2014 Document Reviewed: 03/21/2013 Elsevier Interactive Patient Education [employer]2016 Elsevier Inc.

## 2015-11-27 NOTE — ED Notes (Signed)
Pt A&OX, ambulatory at d/c with steady gait, NAD

## 2015-11-27 NOTE — ED Notes (Signed)
Patient now complains of some visual changes, states she can see her heart beat in her eyes. Denies CP, no SOB

## 2015-11-27 NOTE — ED Provider Notes (Signed)
CSN: [id number]     Arrival date & time 11/27/15  2130 History   First MD Initiated Contact with Patient 11/27/15 [phone]     Chief Complaint  Patient presents with  . Hypertension     (Consider location/radiation/quality/duration/timing/severity/associated sxs/prior Treatment) HPI   Patient is a 50 year old female with history of DM type II, HTN, CKD who presents the ED with hypertension. She states she was at work this morning and she experienced what she described as her heart beat in her eyes. She was concerned that her blood pressure was elevated. She states she is out of her medications for 2 days. She was hospitalized at [place] in the past for hypertension. Patient currently denies pain, chest pain, shortness of breath, Dizziness, headache, visual changes, blurred vision, abdominal pain, nausea, vomiting.  Past Medical History  Diagnosis Date  . Diabetes mellitus   . Hypertension   . Hyperlipidemia   . Thyroid disease     ? past problem  . Chronic kidney disease     renal insufficiency (1.6-1.9 creatinine)  . Increased frequency of headaches    Past Surgical History  Procedure Laterality Date  . Cesarean section      x 2  . Cystectomy  2001    thyroid   Family History  Problem Relation Age of Onset  . Diabetes Mother   . Heart disease Mother   . Hyperlipidemia Mother   . Cancer Father     lung, non-smoker. deceased 3yr  . Diabetes Sister   . Heart disease Brother     MI @ 17   Social History  Substance Use Topics  . Smoking status: Current Every Day Smoker -- 17 years    Types: Cigarettes  . Smokeless tobacco: None     Comment: 2-3 cigarettes daily- started at 50 yrs old  . Alcohol Use: No   OB History    No data available     Review of Systems  Constitutional: Negative for fever.  Eyes: Negative for visual disturbance.  Respiratory: Negative for chest tightness and shortness of breath.   Cardiovascular: Negative for chest pain and leg swelling.   Gastrointestinal: Negative for nausea, vomiting and abdominal pain.  Genitourinary: Negative for dysuria and hematuria.  Musculoskeletal: Negative for neck pain.  Neurological: Negative for dizziness, syncope, weakness, numbness and headaches.  Psychiatric/Behavioral: Negative for confusion.      Allergies  Review of patient's allergies indicates no known allergies.  Home Medications   Prior to Admission medications   Medication Sig Start Date End Date Taking? Authorizing Provider  acetaminophen (TYLENOL) 325 MG tablet Take 650 mg by mouth every 6 (six) hours as needed for mild pain.   Yes Historical Provider, MD  hydrochlorothiazide (HYDRODIURIL) 25 MG tablet Take 25 mg by mouth every other day. 07/21/11  Yes Sandford Craze, NP  insulin detemir (LEVEMIR) 100 UNIT/ML injection Inject 50 Units into the skin 2 (two) times daily.   Yes Historical Provider, MD  Multiple Vitamins-Minerals (MULTIPLE VITAMINS/WOMENS PO) Take 1 tablet by mouth daily.     Yes Historical Provider, MD  amLODipine (NORVASC) 5 MG tablet Take 1 tablet (5 mg total) by mouth daily. 11/27/15   Jerre Simon, PA  cloNIDine (CATAPRES) 0.3 MG tablet Take 1 tablet (0.3 mg total) by mouth 2 (two) times daily. 11/27/15   Jerre Simon, PA  glimepiride (AMARYL) 4 MG tablet Take 1 tablet (4 mg total) by mouth daily with breakfast. 11/27/15   Jerre Simon, PA  insulin detemir (LEVEMIR FLEXPEN) 100 UNIT/ML injection Inject 40 Units into the skin 2 (two) times daily. Patient taking differently: Inject 50 Units into the skin 2 (two) times daily.  08/14/11   Sandford CrazeMelissa O'Sullivan, NP  insulin glargine (LANTUS SOLOSTAR) 100 UNIT/ML injection Inject 40 Units into the skin 2 (two) times daily. 08/15/11 08/14/12  Sandford CrazeMelissa O'Sullivan, NP  Insulin Pen Needle 31G X 5 MM MISC Use with insulin injection two times a day. 03/21/11   Sandford CrazeMelissa O'Sullivan, NP  labetalol (NORMODYNE) 200 MG tablet Take 1 tablet (200 mg total) by mouth 2 (two) times daily.  07/21/11   Sandford CrazeMelissa O'Sullivan, NP  losartan (COZAAR) 50 MG tablet Take 1 tablet (50 mg total) by mouth daily. 11/27/15   Jerre SimonJessica L Focht, PA  simvastatin (ZOCOR) 20 MG tablet Take 1 tablet (20 mg total) by mouth daily. 11/27/15   Jerre SimonJessica L Focht, PA  telmisartan (MICARDIS) 40 MG tablet Take 40 mg by mouth daily.    Historical Provider, MD   BP 240/109 mmHg  Pulse 72  Temp(Src) 98.7 F (37.1 C) (Oral)  Resp 20  Wt 116.376 kg  SpO2 100% Physical Exam  Constitutional: She appears well-developed and well-nourished. No distress.  HENT:  Head: Normocephalic and atraumatic.  Eyes: Conjunctivae, EOM and lids are normal. Pupils are equal, round, and reactive to light.  Neck: Normal range of motion.  Cardiovascular: Normal rate, regular rhythm and normal heart sounds.  Exam reveals no gallop and no friction rub.   No murmur heard. Pulmonary/Chest: Effort normal and breath sounds normal. No respiratory distress. She has no wheezes. She has no rales.  Abdominal: Soft. Bowel sounds are normal. There is no tenderness. There is no rebound and no guarding.  Musculoskeletal: Normal range of motion. She exhibits no edema.  Neurological: She is alert. Coordination normal.  Skin: Skin is warm and dry.  Psychiatric: She has a normal mood and affect. Her behavior is normal.  Nursing note and vitals reviewed.   ED Course  Procedures (including critical care time) Labs Review Labs Reviewed  COMPREHENSIVE METABOLIC PANEL - Abnormal; Notable for the following:    Glucose, Bld 239 (*)    BUN 23 (*)    Creatinine, Ser 3.98 (*)    Albumin 3.3 (*)    AST 13 (*)    ALT 12 (*)    Alkaline Phosphatase 145 (*)    GFR calc non Af Amer 12 (*)    GFR calc Af Amer 14 (*)    All other components within normal limits  URINALYSIS, ROUTINE W REFLEX MICROSCOPIC (NOT AT [facility]) - Abnormal; Notable for the following:    APPearance CLOUDY (*)    Glucose, UA 500 (*)    Hgb urine dipstick SMALL (*)    Protein, ur >300 (*)     Leukocytes, UA TRACE (*)    All other components within normal limits  URINE MICROSCOPIC-ADD ON - Abnormal; Notable for the following:    Squamous Epithelial / LPF 6-30 (*)    Bacteria, UA RARE (*)    All other components within normal limits  URINE CULTURE  CBC WITH DIFFERENTIAL/PLATELET    Imaging Review No results found. I have personally reviewed and evaluated these images and lab results as part of my medical decision-making.   EKG Interpretation None      MDM   Final diagnoses:  Essential hypertension   Patient with elevated blood pressure. Patient was out of her medications for 2 days. While in the  ED she denied being in any pain or having any other symptoms. Patient was given her home blood pressure medications in the ED. Her blood pressure decreased prior to discharge (BP 171/78). No concerns for hypertension urgency or emergency at this time. Pt with history of chronic kidney disease her serum creatinine was elevated from 2015. Urinalysis revealed possible UTI although patient is asymptomatic. Will culture urine. Patient was discharged with a refill of her home medications and instructed to follow-up with a primary care provider to be followed for her hypertension, chronic kidney disease, and diabetes.  Patient does not currently have a PCP. She states she will find one next week and follow-up. Patient was stable at time of discharge. I discussed all of the results with the patient and she expressed understanding to the verbal discharge instructions.   Jerre Simon, PA 11/27/15 1220  Alvira Monday, MD 11/28/15 [phone]

## 2015-11-28 LAB — URINE CULTURE

## 2017-02-15 ENCOUNTER — Encounter (HOSPITAL_COMMUNITY): Payer: Self-pay | Admitting: Emergency Medicine

## 2017-02-15 ENCOUNTER — Ambulatory Visit (HOSPITAL_COMMUNITY)
Admission: EM | Admit: 2017-02-15 | Discharge: 2017-02-15 | Disposition: A | Discharge status: Home / Self Care | Payer: Self-pay | Attending: Internal Medicine | Admitting: Internal Medicine

## 2017-02-15 DIAGNOSIS — I10 Essential (primary) hypertension: Secondary | ICD-10-CM

## 2017-02-15 DIAGNOSIS — M791 Myalgia: Secondary | ICD-10-CM

## 2017-02-15 DIAGNOSIS — M7918 Myalgia, other site: Secondary | ICD-10-CM

## 2017-02-15 DIAGNOSIS — E1142 Type 2 diabetes mellitus with diabetic polyneuropathy: Secondary | ICD-10-CM

## 2017-02-15 MED ORDER — CLONIDINE HCL 0.1 MG PO TABS
0.2000 mg | ORAL_TABLET | Freq: Once | ORAL | Status: AC
  Administered 2017-02-15: 0.2 mg via ORAL

## 2017-02-15 MED ORDER — METHOCARBAMOL 500 MG PO TABS: 500.0000 mg | ORAL_TABLET | Freq: Four times a day (QID) | ORAL | 0 refills | Status: DC | PRN

## 2017-02-15 MED ORDER — CLONIDINE HCL 0.1 MG PO TABS
ORAL_TABLET | ORAL | Status: AC
  Filled 2017-02-15: qty 2

## 2017-02-15 MED ORDER — GABAPENTIN 300 MG PO CAPS: 300.0000 mg | ORAL_CAPSULE | Freq: Three times a day (TID) | ORAL | 0 refills | Status: AC

## 2017-02-15 NOTE — Discharge Instructions (Signed)
Your blood pressure is significantly elevated here today. We have given your medicine to help lower. If you have any headache, blurred vision, chest pain, or shortness of breath, go immediately to the emergency room. Contact your doctor and schedule an appointment for follow-up care, as he may need to have her hypertension medicines adjusted.  The numbness and tingling in your legs is most likely neuropathy, this is because of your diabetes, recommend diet, exercise, weight loss, heat fresh fruits and vegetables, keep her blood sugars well under control, and was started on a medicine for pain called gabapentin, one tablet 3 times a day. Take 3 days to get to the full dose as we discussed.  Finally for your back pain, recommend warm compresses or heating pads, muscle relaxer for pain, follow-up with regular doctor as needed  I have attached the contact information for [facility], contact them to schedule an appointment to establish for primary care.
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
## 2017-03-28 ENCOUNTER — Encounter (HOSPITAL_COMMUNITY): Payer: Self-pay | Admitting: Family Medicine

## 2017-03-28 ENCOUNTER — Ambulatory Visit (HOSPITAL_COMMUNITY)
Admission: EM | Admit: 2017-03-28 | Discharge: 2017-03-28 | Disposition: A | Discharge status: Home / Self Care | Payer: Self-pay | Attending: Family Medicine | Admitting: Family Medicine

## 2017-03-28 DIAGNOSIS — M79604 Pain in right leg: Secondary | ICD-10-CM

## 2017-03-28 DIAGNOSIS — R600 Localized edema: Secondary | ICD-10-CM

## 2017-03-28 DIAGNOSIS — M79605 Pain in left leg: Secondary | ICD-10-CM

## 2017-03-28 NOTE — Discharge Instructions (Signed)
Use TED/Compression stockings while you are up.  Elevate both of your legs.  Follow up with your Primary Care Provider next week for Recheck.

## 2017-03-28 NOTE — ED Triage Notes (Signed)
Pt here for bilateral leg pain, redness, warm and edematous.

## 2017-03-28 NOTE — ED Provider Notes (Signed)
[facility]    CSN: [id number] Arrival date & time: 03/28/17  1651     History   Chief Complaint Chief Complaint  Patient presents with  . Leg Pain    HPI Teresa Liana GeroldHamilton V. is a 51 y.o. female.   Preslie presents with complaints of bilateral leg swelling and pain which has been ongoing intermittently for a long time, but more consistently for the past 1 week. They are painful, rating her pain 8/10. It improves when she elevates her legs. Denies fevers, open areas or drainage. She has never used compression stockings. She has not taken any medications for the pain. She states she is prescribed hydrochlorothiazide but does not take this regularly. Denies CHF history. Denies chest pain. States she does have some shortness of breath with activity but this is not necessarily new for her.       Past Medical History:  Diagnosis Date  . Chronic kidney disease    renal insufficiency (1.6-1.9 creatinine)  . Diabetes mellitus   . Hyperlipidemia   . Hypertension   . Increased frequency of headaches   . Thyroid disease    ? past problem    Patient Active Problem List   Diagnosis Date Noted  . Vaginal candidiasis 07/21/2011  . Chronic renal insufficiency 03/10/2011  . Tobacco abuse 03/10/2011  . Swelling of eyelid 11/08/2010  . Proteinuria 11/01/2010  . THYROID CYST 08/12/2010  . DIABETES MELLITUS, TYPE II 08/12/2010  . HYPERTENSION 08/12/2010    Past Surgical History:  Procedure Laterality Date  . CESAREAN SECTION     x 2  . CYSTECTOMY  2001   thyroid    OB History    No data available       Home Medications    Prior to Admission medications   Medication Sig Start Date End Date Taking? Authorizing Provider  acetaminophen (TYLENOL) 325 MG tablet Take 650 mg by mouth every 6 (six) hours as needed for mild pain.    [provider]  amLODipine (NORVASC) 5 MG tablet Take 1 tablet (5 mg total) by mouth daily. 11/27/15   Focht, Joyce CopaJessica L, PA    cloNIDine (CATAPRES) 0.3 MG tablet Take 1 tablet (0.3 mg total) by mouth 2 (two) times daily. 11/27/15   Focht, Joyce CopaJessica L, PA  gabapentin (NEURONTIN) 300 MG capsule Take 1 capsule (300 mg total) by mouth 3 (three) times daily. 02/15/17   Dorena BodoKennard, Lawrence, NP  glimepiride (AMARYL) 4 MG tablet Take 1 tablet (4 mg total) by mouth daily with breakfast. 11/27/15   Focht, Joyce CopaJessica L, PA  hydrochlorothiazide (HYDRODIURIL) 25 MG tablet Take 25 mg by mouth every other day. 07/21/11   Sandford Craze'Sullivan, Melissa, NP  insulin detemir (LEVEMIR FLEXPEN) 100 UNIT/ML injection Inject 40 Units into the skin 2 (two) times daily. Patient taking differently: Inject 50 Units into the skin 2 (two) times daily.  08/14/11   Sandford Craze'Sullivan, Melissa, NP  insulin detemir (LEVEMIR) 100 UNIT/ML injection Inject 50 Units into the skin 2 (two) times daily.    [provider]  insulin glargine (LANTUS SOLOSTAR) 100 UNIT/ML injection Inject 40 Units into the skin 2 (two) times daily. 08/15/11 08/14/12  Sandford Craze'Sullivan, Melissa, NP  Insulin Pen Needle 31G X 5 MM MISC Use with insulin injection two times a day. 03/21/11   Sandford Craze'Sullivan, Melissa, NP  labetalol (NORMODYNE) 200 MG tablet Take 1 tablet (200 mg total) by mouth 2 (two) times daily. 07/21/11   Sandford Craze'Sullivan, Melissa, NP  losartan (COZAAR) 50 MG tablet  Take 1 tablet (50 mg total) by mouth daily. 11/27/15   Focht, Joyce Copa, PA  methocarbamol (ROBAXIN) 500 MG tablet Take 1 tablet (500 mg total) by mouth every 6 (six) hours as needed for muscle spasms. 02/15/17   Dorena Bodo, NP  metoprolol tartrate (LOPRESSOR) 50 MG tablet Take 50 mg by mouth 2 (two) times daily.    [provider]  Multiple Vitamins-Minerals (MULTIPLE VITAMINS/WOMENS PO) Take 1 tablet by mouth daily.      [provider]  simvastatin (ZOCOR) 20 MG tablet Take 1 tablet (20 mg total) by mouth daily. 11/27/15   Focht, Joyce Copa, PA  telmisartan (MICARDIS) 40 MG tablet Take 40 mg by mouth daily.    [provider]    Family History Family History  Problem Relation Age of Onset  . Diabetes Mother   . Heart disease Mother   . Hyperlipidemia Mother   . Cancer Father        lung, non-smoker. deceased 55yr  . Diabetes Sister   . Heart disease Brother        MI @ 70    Social History Social History  Substance Use Topics  . Smoking status: Current Every Day Smoker    Years: 17.00    Types: Cigarettes  . Smokeless tobacco: Not on file     Comment: 2-3 cigarettes daily- started at 51 yrs old  . Alcohol use No     Allergies   Patient has no known allergies.   Review of Systems Review of Systems  Constitutional: Negative for fatigue and fever.  Cardiovascular: Positive for leg swelling. Negative for chest pain.  Skin: Positive for color change.     Physical Exam Triage Vital Signs ED Triage Vitals  Enc Vitals Group     BP 03/28/17 1721 (!) 189/83     Pulse Rate 03/28/17 1721 83     Resp 03/28/17 1721 18     Temp 03/28/17 1721 98.3 F (36.8 C)     Temp src --      SpO2 03/28/17 1721 99 %     Weight --      Height --      Head Circumference --      Peak Flow --      Pain Score 03/28/17 1719 8     Pain Loc --      Pain Edu? --      Excl. in GC? --    No data found.   Updated Vital Signs BP (!) 189/83   Pulse 83   Temp 98.3 F (36.8 C)   Resp 18   SpO2 99%   Visual Acuity Right Eye Distance:   Left Eye Distance:   Bilateral Distance:    Right Eye Near:   Left Eye Near:    Bilateral Near:     Physical Exam  Constitutional: She is oriented to person, place, and time. She appears well-developed and well-nourished.  Cardiovascular: Normal rate, regular rhythm and normal heart sounds.   Pulmonary/Chest: Effort normal and breath sounds normal. She has no wheezes. She has no rales.  Musculoskeletal:       Right lower leg: She exhibits edema.       Left lower leg: She exhibits edema.  +2-3 bilateral lower leg edema with mild generalized redness; generalized  tenderness without specific calf tenderness  Neurological: She is alert and oriented to person, place, and time.     UC Treatments / Results  Labs (  all labs ordered are listed, but only abnormal results are displayed) Labs Reviewed - No data to display  EKG  EKG Interpretation None       Radiology No results found.  Procedures Procedures (including critical care time)  Medications Ordered in UC Medications - No data to display   Initial Impression / Assessment and Plan / UC Course  I have reviewed the triage vital signs and the nursing notes.  Pertinent labs & imaging results that were available during my care of the patient were reviewed by me and considered in my medical decision making (see chart for details).     Patient with bilateral lower leg edema, mild redness and tenderness. She has not been consistently elevating her legs. She has had this in the past. Recommended use of Ted stockings and more regular elevation of her extremities. Bilateral legs wrapped with ACE wraps today, as she is not sure when she will be able to pick up Teds. It is unclear how regularly she is taking her prescribed medications. Strongly encouraged regular follow up, and recheck next week with a primary care provider for continued management of this chronic issue. Patient verbalized understanding of instructions.  Final Clinical Impressions(s) / UC Diagnoses   Final diagnoses:  Bilateral lower extremity edema    New Prescriptions Discharge Medication List as of 03/28/2017  6:05 PM       Controlled Substance Prescriptions [place] Controlled Substance Registry consulted? Not Applicable   Georgetta Haber, NP 03/28/17 1907

## 2017-03-30 ENCOUNTER — Inpatient Hospital Stay (HOSPITAL_COMMUNITY)
Admission: EM | Admit: 2017-03-30 | Discharge: 2017-05-12 | DRG: 673 | Disposition: E | Payer: BLUE CROSS/BLUE SHIELD | Attending: Internal Medicine | Admitting: Internal Medicine

## 2017-03-30 ENCOUNTER — Inpatient Hospital Stay (HOSPITAL_COMMUNITY): Payer: BLUE CROSS/BLUE SHIELD

## 2017-03-30 ENCOUNTER — Other Ambulatory Visit: Payer: Self-pay

## 2017-03-30 ENCOUNTER — Emergency Department (HOSPITAL_COMMUNITY)
Admit: 2017-03-30 | Discharge: 2017-03-30 | Disposition: A | Discharge status: Home / Self Care | Payer: BLUE CROSS/BLUE SHIELD

## 2017-03-30 ENCOUNTER — Encounter (HOSPITAL_COMMUNITY): Payer: Self-pay | Admitting: Emergency Medicine

## 2017-03-30 ENCOUNTER — Emergency Department (HOSPITAL_COMMUNITY): Payer: BLUE CROSS/BLUE SHIELD

## 2017-03-30 DIAGNOSIS — Z978 Presence of other specified devices: Secondary | ICD-10-CM

## 2017-03-30 DIAGNOSIS — Z7189 Other specified counseling: Secondary | ICD-10-CM

## 2017-03-30 DIAGNOSIS — D72829 Elevated white blood cell count, unspecified: Secondary | ICD-10-CM | POA: Diagnosis not present

## 2017-03-30 DIAGNOSIS — E872 Acidosis, unspecified: Secondary | ICD-10-CM

## 2017-03-30 DIAGNOSIS — E119 Type 2 diabetes mellitus without complications: Secondary | ICD-10-CM

## 2017-03-30 DIAGNOSIS — E875 Hyperkalemia: Secondary | ICD-10-CM | POA: Diagnosis not present

## 2017-03-30 DIAGNOSIS — K5900 Constipation, unspecified: Secondary | ICD-10-CM | POA: Diagnosis not present

## 2017-03-30 DIAGNOSIS — N186 End stage renal disease: Secondary | ICD-10-CM | POA: Diagnosis present

## 2017-03-30 DIAGNOSIS — R7401 Elevation of levels of liver transaminase levels: Secondary | ICD-10-CM

## 2017-03-30 DIAGNOSIS — Z794 Long term (current) use of insulin: Secondary | ICD-10-CM

## 2017-03-30 DIAGNOSIS — R109 Unspecified abdominal pain: Secondary | ICD-10-CM

## 2017-03-30 DIAGNOSIS — I4891 Unspecified atrial fibrillation: Secondary | ICD-10-CM | POA: Diagnosis not present

## 2017-03-30 DIAGNOSIS — F17210 Nicotine dependence, cigarettes, uncomplicated: Secondary | ICD-10-CM | POA: Diagnosis present

## 2017-03-30 DIAGNOSIS — M7989 Other specified soft tissue disorders: Secondary | ICD-10-CM

## 2017-03-30 DIAGNOSIS — I10 Essential (primary) hypertension: Secondary | ICD-10-CM

## 2017-03-30 DIAGNOSIS — N189 Chronic kidney disease, unspecified: Secondary | ICD-10-CM

## 2017-03-30 DIAGNOSIS — Z6839 Body mass index (BMI) 39.0-39.9, adult: Secondary | ICD-10-CM

## 2017-03-30 DIAGNOSIS — E162 Hypoglycemia, unspecified: Secondary | ICD-10-CM | POA: Diagnosis not present

## 2017-03-30 DIAGNOSIS — A0472 Enterocolitis due to Clostridium difficile, not specified as recurrent: Secondary | ICD-10-CM | POA: Diagnosis not present

## 2017-03-30 DIAGNOSIS — J9601 Acute respiratory failure with hypoxia: Secondary | ICD-10-CM | POA: Diagnosis not present

## 2017-03-30 DIAGNOSIS — E785 Hyperlipidemia, unspecified: Secondary | ICD-10-CM | POA: Diagnosis present

## 2017-03-30 DIAGNOSIS — I120 Hypertensive chronic kidney disease with stage 5 chronic kidney disease or end stage renal disease: Secondary | ICD-10-CM | POA: Diagnosis present

## 2017-03-30 DIAGNOSIS — R269 Unspecified abnormalities of gait and mobility: Secondary | ICD-10-CM | POA: Diagnosis present

## 2017-03-30 DIAGNOSIS — I469 Cardiac arrest, cause unspecified: Secondary | ICD-10-CM | POA: Diagnosis not present

## 2017-03-30 DIAGNOSIS — D729 Disorder of white blood cells, unspecified: Secondary | ICD-10-CM | POA: Diagnosis not present

## 2017-03-30 DIAGNOSIS — L89310 Pressure ulcer of right buttock, unstageable: Secondary | ICD-10-CM | POA: Diagnosis present

## 2017-03-30 DIAGNOSIS — K760 Fatty (change of) liver, not elsewhere classified: Secondary | ICD-10-CM | POA: Diagnosis present

## 2017-03-30 DIAGNOSIS — Z9119 Patient's noncompliance with other medical treatment and regimen: Secondary | ICD-10-CM

## 2017-03-30 DIAGNOSIS — M79605 Pain in left leg: Secondary | ICD-10-CM | POA: Diagnosis not present

## 2017-03-30 DIAGNOSIS — E11649 Type 2 diabetes mellitus with hypoglycemia without coma: Secondary | ICD-10-CM | POA: Diagnosis not present

## 2017-03-30 DIAGNOSIS — Z515 Encounter for palliative care: Secondary | ICD-10-CM | POA: Diagnosis not present

## 2017-03-30 DIAGNOSIS — D631 Anemia in chronic kidney disease: Secondary | ICD-10-CM | POA: Diagnosis present

## 2017-03-30 DIAGNOSIS — L89152 Pressure ulcer of sacral region, stage 2: Secondary | ICD-10-CM | POA: Diagnosis present

## 2017-03-30 DIAGNOSIS — E8770 Fluid overload, unspecified: Secondary | ICD-10-CM | POA: Diagnosis present

## 2017-03-30 DIAGNOSIS — M79606 Pain in leg, unspecified: Secondary | ICD-10-CM | POA: Diagnosis not present

## 2017-03-30 DIAGNOSIS — N185 Chronic kidney disease, stage 5: Secondary | ICD-10-CM

## 2017-03-30 DIAGNOSIS — E1151 Type 2 diabetes mellitus with diabetic peripheral angiopathy without gangrene: Secondary | ICD-10-CM | POA: Diagnosis present

## 2017-03-30 DIAGNOSIS — Z8349 Family history of other endocrine, nutritional and metabolic diseases: Secondary | ICD-10-CM

## 2017-03-30 DIAGNOSIS — G9341 Metabolic encephalopathy: Secondary | ICD-10-CM | POA: Diagnosis not present

## 2017-03-30 DIAGNOSIS — L8990 Pressure ulcer of unspecified site, unspecified stage: Secondary | ICD-10-CM | POA: Diagnosis not present

## 2017-03-30 DIAGNOSIS — Z833 Family history of diabetes mellitus: Secondary | ICD-10-CM

## 2017-03-30 DIAGNOSIS — N2581 Secondary hyperparathyroidism of renal origin: Secondary | ICD-10-CM | POA: Diagnosis present

## 2017-03-30 DIAGNOSIS — Z66 Do not resuscitate: Secondary | ICD-10-CM | POA: Diagnosis not present

## 2017-03-30 DIAGNOSIS — M79604 Pain in right leg: Secondary | ICD-10-CM | POA: Diagnosis not present

## 2017-03-30 DIAGNOSIS — M318 Other specified necrotizing vasculopathies: Secondary | ICD-10-CM | POA: Diagnosis present

## 2017-03-30 DIAGNOSIS — R740 Nonspecific elevation of levels of transaminase and lactic acid dehydrogenase [LDH]: Secondary | ICD-10-CM | POA: Diagnosis not present

## 2017-03-30 DIAGNOSIS — J9690 Respiratory failure, unspecified, unspecified whether with hypoxia or hypercapnia: Secondary | ICD-10-CM

## 2017-03-30 DIAGNOSIS — R570 Cardiogenic shock: Secondary | ICD-10-CM | POA: Diagnosis not present

## 2017-03-30 DIAGNOSIS — E1122 Type 2 diabetes mellitus with diabetic chronic kidney disease: Secondary | ICD-10-CM | POA: Diagnosis present

## 2017-03-30 DIAGNOSIS — I771 Stricture of artery: Secondary | ICD-10-CM | POA: Diagnosis present

## 2017-03-30 DIAGNOSIS — N179 Acute kidney failure, unspecified: Principal | ICD-10-CM | POA: Diagnosis present

## 2017-03-30 DIAGNOSIS — Z992 Dependence on renal dialysis: Secondary | ICD-10-CM | POA: Diagnosis not present

## 2017-03-30 DIAGNOSIS — N19 Unspecified kidney failure: Secondary | ICD-10-CM

## 2017-03-30 DIAGNOSIS — M79609 Pain in unspecified limb: Secondary | ICD-10-CM | POA: Diagnosis not present

## 2017-03-30 DIAGNOSIS — R509 Fever, unspecified: Secondary | ICD-10-CM

## 2017-03-30 DIAGNOSIS — E1142 Type 2 diabetes mellitus with diabetic polyneuropathy: Secondary | ICD-10-CM | POA: Diagnosis present

## 2017-03-30 DIAGNOSIS — Z8249 Family history of ischemic heart disease and other diseases of the circulatory system: Secondary | ICD-10-CM

## 2017-03-30 DIAGNOSIS — L89320 Pressure ulcer of left buttock, unstageable: Secondary | ICD-10-CM | POA: Diagnosis present

## 2017-03-30 HISTORY — DX: Dependence on renal dialysis: N18.6

## 2017-03-30 HISTORY — DX: Dependence on renal dialysis: Z99.2

## 2017-03-30 HISTORY — DX: Anemia in other chronic diseases classified elsewhere: D63.8

## 2017-03-30 HISTORY — DX: Type 2 diabetes mellitus without complications: E11.9

## 2017-03-30 HISTORY — DX: Chronic kidney disease, stage 4 (severe): N18.4

## 2017-03-30 LAB — CBC WITH DIFFERENTIAL/PLATELET
BASOS ABS: 0 10*3/uL (ref 0.0–0.1)
Basophils Relative: 0 %
Eosinophils Absolute: 0.6 10*3/uL (ref 0.0–0.7)
Eosinophils Relative: 5 %
HEMATOCRIT: 26.3 % — AB (ref 36.0–46.0)
Hemoglobin: 8.2 g/dL — ABNORMAL LOW (ref 12.0–15.0)
LYMPHS PCT: 13 %
Lymphs Abs: 1.6 10*3/uL (ref 0.7–4.0)
MCH: 29 pg (ref 26.0–34.0)
MCHC: 31.2 g/dL (ref 30.0–36.0)
MCV: 92.9 fL (ref 78.0–100.0)
MONO ABS: 0.9 10*3/uL (ref 0.1–1.0)
Monocytes Relative: 7 %
NEUTROS ABS: 9.1 10*3/uL — AB (ref 1.7–7.7)
Neutrophils Relative %: 75 %
Platelets: 281 10*3/uL (ref 150–400)
RBC: 2.83 MIL/uL — ABNORMAL LOW (ref 3.87–5.11)
RDW: 15.7 % — AB (ref 11.5–15.5)
WBC: 12.2 10*3/uL — ABNORMAL HIGH (ref 4.0–10.5)

## 2017-03-30 LAB — I-STAT BETA HCG BLOOD, ED (MC, WL, AP ONLY): I-stat hCG, quantitative: 6.1 m[IU]/mL — ABNORMAL HIGH (ref <5)

## 2017-03-30 LAB — COMPREHENSIVE METABOLIC PANEL
ALT: 11 U/L — AB (ref 14–54)
AST: 11 U/L — AB (ref 15–41)
Albumin: 2.8 g/dL — ABNORMAL LOW (ref 3.5–5.0)
Alkaline Phosphatase: 118 U/L (ref 38–126)
Anion gap: 13 (ref 5–15)
BILIRUBIN TOTAL: 0.8 mg/dL (ref 0.3–1.2)
BUN: 89 mg/dL — AB (ref 6–20)
CALCIUM: 8.1 mg/dL — AB (ref 8.9–10.3)
CO2: 13 mmol/L — ABNORMAL LOW (ref 22–32)
CREATININE: 13.26 mg/dL — AB (ref 0.44–1.00)
Chloride: 110 mmol/L (ref 101–111)
GFR calc Af Amer: 3 mL/min — ABNORMAL LOW (ref >60)
GFR, EST NON AFRICAN AMERICAN: 3 mL/min — AB (ref >60)
Glucose, Bld: 166 mg/dL — ABNORMAL HIGH (ref 65–99)
POTASSIUM: 6.1 mmol/L — AB (ref 3.5–5.1)
Sodium: 136 mmol/L (ref 135–145)
TOTAL PROTEIN: 6.8 g/dL (ref 6.5–8.1)

## 2017-03-30 LAB — BRAIN NATRIURETIC PEPTIDE: B NATRIURETIC PEPTIDE 5: 71.3 pg/mL (ref 0.0–100.0)

## 2017-03-30 MED ORDER — SODIUM BICARBONATE 8.4 % IV SOLN
50.0000 meq | Freq: Once | INTRAVENOUS | Status: AC
  Administered 2017-03-30: 50 meq via INTRAVENOUS
  Filled 2017-03-30: qty 50

## 2017-03-30 MED ORDER — ACETAMINOPHEN 325 MG PO TABS
650.0000 mg | ORAL_TABLET | Freq: Four times a day (QID) | ORAL | Status: DC | PRN
  Administered 2017-04-01 – 2017-04-02 (×3): 650 mg via ORAL
  Filled 2017-03-30 (×3): qty 2

## 2017-03-30 MED ORDER — SODIUM CHLORIDE 0.9 % IV SOLN
1.0000 g | Freq: Once | INTRAVENOUS | Status: AC
  Administered 2017-03-30: 1 g via INTRAVENOUS
  Filled 2017-03-30: qty 10

## 2017-03-30 MED ORDER — ONDANSETRON HCL 4 MG PO TABS: 4.0000 mg | ORAL_TABLET | Freq: Four times a day (QID) | ORAL | Status: DC | PRN

## 2017-03-30 MED ORDER — METOPROLOL TARTRATE 50 MG PO TABS
50.0000 mg | ORAL_TABLET | Freq: Two times a day (BID) | ORAL | Status: DC
  Administered 2017-03-30 – 2017-04-01 (×3): 50 mg via ORAL
  Filled 2017-03-30: qty 1
  Filled 2017-03-30: qty 2
  Filled 2017-03-30 (×2): qty 1

## 2017-03-30 MED ORDER — FUROSEMIDE 10 MG/ML IJ SOLN
120.0000 mg | Freq: Four times a day (QID) | INTRAVENOUS | Status: DC
  Administered 2017-03-31 (×3): 120 mg via INTRAVENOUS
  Filled 2017-03-30 (×7): qty 12

## 2017-03-30 MED ORDER — FUROSEMIDE 10 MG/ML IJ SOLN
80.0000 mg | Freq: Once | INTRAMUSCULAR | Status: AC
  Administered 2017-03-30: 80 mg via INTRAVENOUS
  Filled 2017-03-30: qty 8

## 2017-03-30 MED ORDER — GABAPENTIN 100 MG PO CAPS
100.0000 mg | ORAL_CAPSULE | Freq: Once | ORAL | Status: AC
  Administered 2017-03-30: 100 mg via ORAL
  Filled 2017-03-30: qty 1

## 2017-03-30 MED ORDER — INSULIN ASPART 100 UNIT/ML ~~LOC~~ SOLN
0.0000 [IU] | Freq: Three times a day (TID) | SUBCUTANEOUS | Status: DC
  Administered 2017-04-03 – 2017-04-06 (×4): 2 [IU] via SUBCUTANEOUS
  Administered 2017-04-06: 3 [IU] via SUBCUTANEOUS
  Administered 2017-04-08 – 2017-04-15 (×2): 2 [IU] via SUBCUTANEOUS

## 2017-03-30 MED ORDER — SIMVASTATIN 20 MG PO TABS
20.0000 mg | ORAL_TABLET | Freq: Every day | ORAL | Status: DC
  Administered 2017-03-31 – 2017-04-15 (×12): 20 mg via ORAL
  Filled 2017-03-30 (×12): qty 1

## 2017-03-30 MED ORDER — INSULIN DETEMIR 100 UNIT/ML ~~LOC~~ SOLN
45.0000 [IU] | Freq: Two times a day (BID) | SUBCUTANEOUS | Status: DC
  Administered 2017-03-30 – 2017-04-01 (×3): 45 [IU] via SUBCUTANEOUS
  Filled 2017-03-30 (×4): qty 0.45

## 2017-03-30 MED ORDER — ACETAMINOPHEN 650 MG RE SUPP: 650.0000 mg | Freq: Four times a day (QID) | RECTAL | Status: DC | PRN

## 2017-03-30 MED ORDER — HYDROCHLOROTHIAZIDE 25 MG PO TABS: 25.0000 mg | ORAL_TABLET | Freq: Every day | ORAL | Status: DC

## 2017-03-30 MED ORDER — AMLODIPINE BESYLATE 10 MG PO TABS
10.0000 mg | ORAL_TABLET | Freq: Every day | ORAL | Status: DC
  Administered 2017-04-01: 10 mg via ORAL
  Filled 2017-03-30 (×2): qty 1

## 2017-03-30 MED ORDER — ONDANSETRON HCL 4 MG/2ML IJ SOLN: 4.0000 mg | Freq: Four times a day (QID) | INTRAMUSCULAR | Status: DC | PRN

## 2017-03-30 MED ORDER — ACETAMINOPHEN 325 MG PO TABS: 650.0000 mg | ORAL_TABLET | Freq: Four times a day (QID) | ORAL | Status: DC | PRN

## 2017-03-30 MED ORDER — INSULIN ASPART 100 UNIT/ML ~~LOC~~ SOLN
5.0000 [IU] | Freq: Once | SUBCUTANEOUS | Status: AC
  Administered 2017-03-30: 5 [IU] via INTRAVENOUS
  Filled 2017-03-30: qty 1

## 2017-03-30 MED ORDER — DEXTROSE 50 % IV SOLN
1.0000 | Freq: Once | INTRAVENOUS | Status: AC
  Administered 2017-03-30: 50 mL via INTRAVENOUS
  Filled 2017-03-30: qty 50

## 2017-03-30 MED ORDER — GABAPENTIN 300 MG PO CAPS
300.0000 mg | ORAL_CAPSULE | Freq: Three times a day (TID) | ORAL | Status: DC
  Administered 2017-03-30 – 2017-04-04 (×13): 300 mg via ORAL
  Filled 2017-03-30 (×13): qty 1

## 2017-03-30 MED ORDER — CLONIDINE HCL 0.3 MG PO TABS
0.3000 mg | ORAL_TABLET | Freq: Two times a day (BID) | ORAL | Status: DC
  Administered 2017-03-30 – 2017-04-01 (×3): 0.3 mg via ORAL
  Filled 2017-03-30 (×5): qty 1

## 2017-03-30 MED ORDER — SODIUM BICARBONATE 650 MG PO TABS
1300.0000 mg | ORAL_TABLET | Freq: Two times a day (BID) | ORAL | Status: DC
  Administered 2017-03-30 – 2017-04-01 (×5): 1300 mg via ORAL
  Filled 2017-03-30 (×5): qty 2

## 2017-03-30 MED ORDER — HYDROCODONE-ACETAMINOPHEN 5-325 MG PO TABS
2.0000 | ORAL_TABLET | Freq: Once | ORAL | Status: AC
  Administered 2017-03-30: 2 via ORAL
  Filled 2017-03-30: qty 2

## 2017-03-30 NOTE — ED Notes (Signed)
Dr. Isaacs at bedside at this time.  

## 2017-03-30 NOTE — Consult Note (Signed)
Reason for Consult: AKI/CKD Referring Physician:  Julian Reil, DO  Teresa Dalton is an 51 y.o. female.  HPI:  Teresa Dalton is a 51 yo AAF with PMH significant for longstanding, poorly controlled HTN, DM, morbid obesity, hyperlipidemia, and progressive CKD.  She was last seen by Dr. Darrick Penna in November 2015 and was told of having stage IV CKD but never went back.  She works in [place] and shares her time either there or in [place].  She presented to [facility] with complaints of lower extremity edema and pain which has been worsening over the past few weeks.  She has been taking 3 naproxens daily for the discomfort and admits to having SBP's in the 160's-170's at home.  She does not know what her last Hgb A1c has been and is also taking an ARB for BP.  She was told by her PCP in [place] to f/u with Nephrology, however she did not because of work.    She denies any N/V/D, dysuria, pyuria, hematuria, but does note nocturia and foamy/frothy urine.  She does not have any family history for Kidney Disease.  Trend in Creatinine:  Creatinine, Ser  Date/Time Value Ref Range Status  03/19/2017 07:37 PM 13.26 (H) 0.44 - 1.00 mg/dL Final  16/03/9603 54:09 AM 3.98 (H) 0.44 - 1.00 mg/dL Final  81/19/1478 29:56 PM 3.03 (H) 0.50 - 1.10 mg/dL Final  21/30/8657 84:69 PM 1.40 (H) 0.50 - 1.10 mg/dL   62/95/2841 32:44 PM 1.6 (H) 0.4 - 1.2 mg/dL Final  06/14/7251 66:44 PM 1.9 (H) 0.4 - 1.2 mg/dL Final  03/47/4259 56:38 AM 1.4 (H)  Final    PMH:   Past Medical History:  Diagnosis Date  . Chronic kidney disease    renal insufficiency (1.6-1.9 creatinine)  . Diabetes mellitus   . Hyperlipidemia   . Hypertension   . Increased frequency of headaches   . Thyroid disease    ? past problem    PSH:   Past Surgical History:  Procedure Laterality Date  . CESAREAN SECTION     x 2  . CYSTECTOMY  2001   thyroid    Allergies: No Known Allergies  Medications:   Prior to Admission medications    Medication Sig Start Date End Date Taking? Authorizing Provider  acetaminophen (TYLENOL) 325 MG tablet Take 650 mg by mouth every 6 (six) hours as needed for mild pain.   Yes [provider]  amLODipine (NORVASC) 10 MG tablet Take 10 mg by mouth daily.   Yes [provider]  cloNIDine (CATAPRES) 0.3 MG tablet Take 1 tablet (0.3 mg total) by mouth 2 (two) times daily. 11/27/15  Yes Teresa Dalton  gabapentin (NEURONTIN) 300 MG capsule Take 1 capsule (300 mg total) by mouth 3 (three) times daily. 02/15/17  Yes Teresa Dalton  glimepiride (AMARYL) 4 MG tablet Take 1 tablet (4 mg total) by mouth daily with breakfast. 11/27/15  Yes Teresa Dalton  hydrochlorothiazide (HYDRODIURIL) 25 MG tablet Take 25 mg by mouth daily.  07/21/11  Yes Teresa Dalton  insulin detemir (LEVEMIR FLEXPEN) 100 UNIT/ML injection Inject 40 Units into the skin 2 (two) times daily. Patient taking differently: Inject 70 Units into the skin 2 (two) times daily.  08/14/11  Yes Teresa Dalton  losartan (COZAAR) 50 MG tablet Take 1 tablet (50 mg total) by mouth daily. 11/27/15  Yes Teresa Dalton  metoprolol tartrate (LOPRESSOR) 50 MG tablet Take 50 mg by mouth 2 (two)  times daily.   Yes [provider]  simvastatin (ZOCOR) 20 MG tablet Take 1 tablet (20 mg total) by mouth daily. 11/27/15  Yes Teresa Dalton  sodium bicarbonate 650 MG tablet Take 1,300 mg by mouth 2 (two) times daily.   Yes [provider]  Insulin Pen Needle 31G X 5 MM MISC Use with insulin injection two times a day. 03/21/11   Teresa Dalton  Multiple Vitamins-Minerals (MULTIPLE VITAMINS/WOMENS PO) Take 1 tablet by mouth daily.      [provider]    Inpatient medications: . [START ON 03/31/2017] amLODipine  10 mg Oral Daily  . cloNIDine  0.3 mg Oral BID  . furosemide  80 mg Intravenous Once  . gabapentin  300 mg Oral TID  . [START ON 03/31/2017] insulin aspart  0-15  Units Subcutaneous TID WC  . insulin detemir  45 Units Subcutaneous BID  . metoprolol tartrate  50 mg Oral BID  . [START ON 03/31/2017] simvastatin  20 mg Oral Daily  . sodium bicarbonate  1,300 mg Oral BID    Discontinued Meds:   Medications Discontinued During This Encounter  Medication Reason  . telmisartan (MICARDIS) 40 MG tablet Cost of medication  . insulin detemir (LEVEMIR) 100 UNIT/ML injection Duplicate  . naproxen sodium (ANAPROX) 220 MG tablet   . methocarbamol (ROBAXIN) 500 MG tablet   . amLODipine (NORVASC) 5 MG tablet   . insulin glargine (LANTUS SOLOSTAR) 100 UNIT/ML injection   . labetalol (NORMODYNE) 200 MG tablet   . hydrochlorothiazide (HYDRODIURIL) tablet 25 mg   . acetaminophen (TYLENOL) tablet 650 mg     Social History:  reports that she has been smoking Cigarettes.  She has smoked for the past 17.00 years. She does not have any smokeless tobacco history on file. She reports that she uses drugs, including Marijuana. She reports that she does not drink alcohol.  Family History:   Family History  Problem Relation Age of Onset  . Diabetes Mother   . Heart disease Mother   . Hyperlipidemia Mother   . Cancer Father        lung, non-smoker. deceased 7131yr  . Diabetes Sister   . Heart disease Brother        MI @ 4444    Pertinent items are noted in HPI. Weight change:  No intake or output data in the 24 hours ending 2017/05/24 2253 BP (!) 174/76   Pulse 72   Temp 97.8 F (36.6 C)   Resp 16   Ht 5\' 5"  (1.651 m)   Wt 90.7 kg (200 lb)   LMP 03/26/2017   SpO2 100%   BMI 33.28 kg/m  Vitals:   2017/05/24 1645 2017/05/24 1942 2017/05/24 2101 2017/05/24 2247  BP: (!) 163/88 (!) 173/78 (!) 168/76 (!) 174/76  Pulse: 70 73 72   Resp:  18 16   Temp:      SpO2: 99% 99% 100%   Weight:      Height:         General appearance: alert, cooperative and moderately obese Head: Normocephalic, without obvious abnormality, atraumatic Resp: clear to auscultation  bilaterally Cardio: regular rate and rhythm, S1, S2 normal, no murmur, click, rub or gallop GI: obese, +BS, non-tender Extremities: edema 3+ edema of lower extremities, tight with pain on palpation  Labs: Basic Metabolic Panel:  Recent Labs Lab 2017/05/24 1937  NA 136  K 6.1*  CL 110  CO2 13*  GLUCOSE 166*  BUN 89*  CREATININE 13.26*  ALBUMIN 2.8*  CALCIUM 8.1*   Liver Function Tests:  Recent Labs Lab 04/07/2017 1937  AST 11*  ALT 11*  ALKPHOS 118  BILITOT 0.8  PROT 6.8  ALBUMIN 2.8*   No results for input(s): LIPASE, AMYLASE in the last 168 hours. No results for input(s): AMMONIA in the last 168 hours. CBC:  Recent Labs Lab 03/21/2017 1937  WBC 12.2*  NEUTROABS 9.1*  HGB 8.2*  HCT 26.3*  MCV 92.9  PLT 281   PT/INR: @LABRCNTIP (inr:5) Cardiac Enzymes: )No results for input(s): CKTOTAL, CKMB, CKMBINDEX, TROPONINI in the last 168 hours. CBG: No results for input(s): GLUCAP in the last 168 hours.  Iron Studies: No results for input(s): IRON, TIBC, TRANSFERRIN, FERRITIN in the last 168 hours.  Xrays/Other Studies: Dg Chest 2 View  Result Date: 03/19/2017 CLINICAL DATA:  Bilateral leg swelling and shooting pain. EXAM: CHEST  2 VIEW COMPARISON:  None. FINDINGS: Markedly limited study due to body habitus. No pneumothorax. Cardiomegaly. The hila and mediastinum are unremarkable. Haziness over the lung bases is suspected to be due to overlapping soft tissues. No overt edema. IMPRESSION: Markedly limited study due to body habitus. Haziness over the bases is favored to be due to overlapping soft tissues. Cardiomegaly. No overt edema. Electronically Signed   By: Gerome Sam III M.D   On: 04/09/2017 18:06     Assessment/Plan: 1.  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  I had a lengthy discussion with the patient regarding the chronicity and severity of her kidney  disease.  She was reluctant to consider dialysis, however she now understands the seriousness of her situation.  Will need to consult IR for tunneled HD catheter in the morning and initiate HD.  Will also continue to educate about dialysis and home therapies.  I again stressed the importance of BP control, diabetes control, and avoidance of NSAIDs to help slow the progression of CKD.  She had advanced CKD when seen by Dr. Darrick Penna 3 years ago so I don't believe this is reversible. 2. Malignant HTN- resume home meds except for the ARB and HCTZ.  Will start IV lasix to help with volume and temporize until we can initiate HD. 3. Volume overload- as above will start IV lasix and follow but will likely need to initiate HD tomorrow. 4. Hyperkalemia- due to #1.  Stop ARB and NSAIDs, will start IV lasix and give some IV bicarb x 1 amp and follow. May need kayexalate as well if still elevated in am. 5. Anemia of CKD stage 5- will check iron stores but will also start ESA once started on HD 6. Secondary HPTH- will check ca/phos, iPTH and start binders/vitamin D as indicated 7. Medical noncompliance- stressed the importance of compliance with meds and medical follow up 8. DM- per primary 9. Vascular access- will need TDC and AVF/AVG placed during this hospitalization.   Julien Nordmann Larwence Tu 03/28/2017, 10:53 PM

## 2017-03-30 NOTE — ED Notes (Signed)
Transported to Venous study via stretcher. Pain medication administered prior to transport.

## 2017-03-30 NOTE — H&P (Signed)
History and Physical    Teresa KayserDiahann Shelnutt V. WUJ:[id number] DOB: 03-30-1966 DOA: 06/12/2017  PCP: Patient, No Pcp Per  Patient coming from: Home  I have personally briefly reviewed patient's [facility]  Chief Complaint: Leg swelling  HPI: Teresa KayserDiahann Freimark V. is a 51 y.o. female with medical history significant of CKD stage 4 back in 2015, hasnt seen nephrology since then, HTN, DM2.  Patient presents to the ED with worsening peripheral edema over the past few weeks.  Nothing makes better or worse.  Symptoms constant.   ED Course: Creat 13, K 6.1, bicarb 13.   Review of Systems: As per HPI otherwise 10 point review of systems negative.   Past Medical History:  Diagnosis Date  . Chronic kidney disease    renal insufficiency (1.6-1.9 creatinine)  . Diabetes mellitus   . Hyperlipidemia   . Hypertension   . Increased frequency of headaches   . Thyroid disease    ? past problem    Past Surgical History:  Procedure Laterality Date  . CESAREAN SECTION     x 2  . CYSTECTOMY  2001   thyroid     reports that she has been smoking Cigarettes.  She has smoked for the past 17.00 years. She does not have any smokeless tobacco history on file. She reports that she uses drugs, including Marijuana. She reports that she does not drink alcohol.  No Known Allergies  Family History  Problem Relation Age of Onset  . Diabetes Mother   . Heart disease Mother   . Hyperlipidemia Mother   . Cancer Father        lung, non-smoker. deceased 6131yr  . Diabetes Sister   . Heart disease Brother        MI @ 3444     Prior to Admission medications   Medication Sig Start Date End Date Taking? Authorizing Provider  acetaminophen (TYLENOL) 325 MG tablet Take 650 mg by mouth every 6 (six) hours as needed for mild pain.   Yes [provider]  amLODipine (NORVASC) 10 MG tablet Take 10 mg by mouth daily.   Yes [provider]  cloNIDine (CATAPRES) 0.3 MG  tablet Take 1 tablet (0.3 mg total) by mouth 2 (two) times daily. 11/27/15  Yes Focht, Jessica L, PA  gabapentin (NEURONTIN) 300 MG capsule Take 1 capsule (300 mg total) by mouth 3 (three) times daily. 02/15/17  Yes Dorena BodoKennard, Lawrence, NP  glimepiride (AMARYL) 4 MG tablet Take 1 tablet (4 mg total) by mouth daily with breakfast. 11/27/15  Yes Focht, Jessica L, PA  hydrochlorothiazide (HYDRODIURIL) 25 MG tablet Take 25 mg by mouth daily.  07/21/11  Yes Sandford Craze'Sullivan, Melissa, NP  insulin detemir (LEVEMIR FLEXPEN) 100 UNIT/ML injection Inject 40 Units into the skin 2 (two) times daily. Patient taking differently: Inject 70 Units into the skin 2 (two) times daily.  08/14/11  Yes Sandford Craze'Sullivan, Melissa, NP  losartan (COZAAR) 50 MG tablet Take 1 tablet (50 mg total) by mouth daily. 11/27/15  Yes Focht, Jessica L, PA  metoprolol tartrate (LOPRESSOR) 50 MG tablet Take 50 mg by mouth 2 (two) times daily.   Yes [provider]  simvastatin (ZOCOR) 20 MG tablet Take 1 tablet (20 mg total) by mouth daily. 11/27/15  Yes Focht, Jessica L, PA  sodium bicarbonate 650 MG tablet Take 1,300 mg by mouth 2 (two) times daily.   Yes [provider]  Insulin Pen Needle 31G X 5 MM MISC Use  with insulin injection two times a day. 03/21/11   Sandford Craze, NP  Multiple Vitamins-Minerals (MULTIPLE VITAMINS/WOMENS PO) Take 1 tablet by mouth daily.      [provider]    Physical Exam: Vitals:   04/05/2017 1111 03/23/2017 1645 03/29/2017 1942 03/29/2017 2101  BP: (!) 173/75 (!) 163/88 (!) 173/78 (!) 168/76  Pulse: 82 70 73 72  Resp: 17  18 16   Temp: 97.8 F (36.6 C)     SpO2: 97% 99% 99% 100%  Weight: 90.7 kg (200 lb)     Height: 5\' 5"  (1.651 m)       Constitutional: NAD, calm, comfortable Eyes: PERRL, lids and conjunctivae normal ENMT: Mucous membranes are moist. Posterior pharynx clear of any exudate or lesions.Normal dentition.  Neck: normal, supple, no masses, no thyromegaly Respiratory: clear to  auscultation bilaterally, no wheezing, no crackles. Normal respiratory effort. No accessory muscle use.  Cardiovascular: Regular rate and rhythm, no murmurs / rubs / gallops. 2-3+ BLE edema.  2+ pedal pulses. No carotid bruits.  Abdomen: no tenderness, no masses palpated. No hepatosplenomegaly. Bowel sounds positive.  Musculoskeletal: no clubbing / cyanosis. No joint deformity upper and lower extremities. Good ROM, no contractures. Normal muscle tone.  Skin: no rashes, lesions, ulcers. No induration Neurologic: CN 2-12 grossly intact. Sensation intact, DTR normal. Strength 5/5 in all 4.  Psychiatric: Normal judgment and insight. Alert and oriented x 3. Normal mood.    Labs on Admission: I have personally reviewed following labs and imaging studies  CBC:  Recent Labs Lab 03/29/2017 1937  WBC 12.2*  NEUTROABS 9.1*  HGB 8.2*  HCT 26.3*  MCV 92.9  PLT 281   Basic Metabolic Panel:  Recent Labs Lab 03/22/2017 1937  NA 136  K 6.1*  CL 110  CO2 13*  GLUCOSE 166*  BUN 89*  CREATININE 13.26*  CALCIUM 8.1*   GFR: Estimated Creatinine Clearance: 5.8 mL/min (A) (by C-G formula based on SCr of 13.26 mg/dL (H)). Liver Function Tests:  Recent Labs Lab 03/15/2017 1937  AST 11*  ALT 11*  ALKPHOS 118  BILITOT 0.8  PROT 6.8  ALBUMIN 2.8*   No results for input(s): LIPASE, AMYLASE in the last 168 hours. No results for input(s): AMMONIA in the last 168 hours. Coagulation Profile: No results for input(s): INR, PROTIME in the last 168 hours. Cardiac Enzymes: No results for input(s): CKTOTAL, CKMB, CKMBINDEX, TROPONINI in the last 168 hours. BNP (last 3 results) No results for input(s): PROBNP in the last 8760 hours. HbA1C: No results for input(s): HGBA1C in the last 72 hours. CBG: No results for input(s): GLUCAP in the last 168 hours. Lipid Profile: No results for input(s): CHOL, HDL, LDLCALC, TRIG, CHOLHDL, LDLDIRECT in the last 72 hours. Thyroid Function Tests: No results for  input(s): TSH, T4TOTAL, FREET4, T3FREE, THYROIDAB in the last 72 hours. Anemia Panel: No results for input(s): VITAMINB12, FOLATE, FERRITIN, TIBC, IRON, RETICCTPCT in the last 72 hours. Urine analysis:    Component Value Date/Time   COLORURINE YELLOW 11/27/2015 0837   APPEARANCEUR CLOUDY (A) 11/27/2015 0837   LABSPEC 1.013 11/27/2015 0837   PHURINE 7.0 11/27/2015 0837   GLUCOSEU 500 (A) 11/27/2015 0837   HGBUR SMALL (A) 11/27/2015 0837   BILIRUBINUR NEGATIVE 11/27/2015 0837   KETONESUR NEGATIVE 11/27/2015 0837   PROTEINUR >300 (A) 11/27/2015 0837   UROBILINOGEN 0.2 05/30/2010 1628   NITRITE NEGATIVE 11/27/2015 0837   LEUKOCYTESUR TRACE (A) 11/27/2015 0837    Radiological Exams on Admission: Dg Chest  2 View  Result Date: 03-Apr-2017 CLINICAL DATA:  Bilateral leg swelling and shooting pain. EXAM: CHEST  2 VIEW COMPARISON:  None. FINDINGS: Markedly limited study due to body habitus. No pneumothorax. Cardiomegaly. The hila and mediastinum are unremarkable. Haziness over the lung bases is suspected to be due to overlapping soft tissues. No overt edema. IMPRESSION: Markedly limited study due to body habitus. Haziness over the bases is favored to be due to overlapping soft tissues. Cardiomegaly. No overt edema. Electronically Signed   By: Gerome Sam III M.D   On: 2017-04-03 18:06    EKG: Independently reviewed.  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 Acute on chronic kidney failure - probably progression to ESRD 1. Nephrology at bedside seeing patient 2. Vas cath in AM by IR 3. Temp measures for hyperk in ED now 4. Repeat BMP in AM 5. Lasix 80mg  IV x1 now 6. Stopping ARB and NSAIDS 7. Tele monitor 8. Renal US 2. DM2 - 1. Levemir 45 BID 2. Mod scale SSI AC 3. A1C in AM 3. HTN - 1. Stopping HCTZ and  ARB 2. Continue other home meds  DVT prophylaxis: SCDs due to planned procedure Code Status: Full Family Communication: Family at bedside Disposition Plan: Home after admit Consults called: Nephrology, Dr. Abel Presto at bedside Admission status: Admit to inpatient   Hillary Bow DO [facility] Pager [phone]  If 7AM-7PM, please contact day team taking care of patient [email or web address] Password [email or web address]  April 03, 2017, 10:46 PM

## 2017-03-30 NOTE — Progress Notes (Signed)
*  PRELIMINARY RESULTS* Vascular Ultrasound Bilateral lower extremity venous duplex has been completed.  Preliminary findings: No evidence of deep vein thrombosis in the visualized veins of the lower extremities or baker's cysts bilaterally.  Very difficult exam due to patient body habitus, positioning and severe swelling.   Chauncey FischerCharlotte C Sharlyne Koeneman 03/28/2017, 5:45 PM

## 2017-03-30 NOTE — ED Provider Notes (Signed)
[facility] Provider Note   CSN: [id number] Arrival date & time: 03/16/2017  1052     History   Chief Complaint Chief Complaint  Patient presents with  . Leg Swelling    HPI Teresa Dalton. is a 51 y.o. female.  HPI   51 yo F here with bilateral leg pain and swelling. Pt states that over the last 1-2 weeks, she has noticed progressively worsening bilateral leg pain and swelling. She was seen at urgent care and was told to wear compression stockings, which have not helped her sx. She has noticed associated weight gain, abdominal swelling, and now SOB with exertion. She has not had sx like this in the past. No recent med changes. She feels like she is urinating normally without urinary sx. No headache. No vision changes. No facial swelling/edema. She describes an aching, throbbing pain in her b/l legs as well as intermittent shooting, tingling pain in her feet and she has known neuropathy. No alleviating factors. Sx worse with palpation and walking. Minimal improvement with elevation of her legs.  Past Medical History:  Diagnosis Date  . Chronic kidney disease    renal insufficiency (1.6-1.9 creatinine)  . Diabetes mellitus   . Hyperlipidemia   . Hypertension   . Increased frequency of headaches   . Thyroid disease    ? past problem    Patient Active Problem List   Diagnosis Date Noted  . Acute on chronic kidney failure (HCC) 04/02/2017  . 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 Vaginal candidiasis 07/21/2011  . Chronic renal insufficiency 03/10/2011  . Tobacco abuse 03/10/2011  . Swelling of eyelid 11/08/2010  . Proteinuria 11/01/2010  . THYROID CYST 08/12/2010  . DM2 (diabetes mellitus, type 2) (HCC) 08/12/2010  . Essential hypertension 08/12/2010    Past Surgical History:  Procedure Laterality Date  . CESAREAN SECTION     x 2  . CYSTECTOMY  2001   thyroid  . IR FLUORO GUIDE CV LINE RIGHT  03/31/2017  . IR US GUIDE  VASC ACCESS RIGHT  03/31/2017    OB History    No data available       Home Medications    Prior to Admission medications   Medication Sig Start Date End Date Taking? Authorizing Provider  acetaminophen (TYLENOL) 325 MG tablet Take 650 mg by mouth every 6 (six) hours as needed for mild pain.   Yes [provider]  amLODipine (NORVASC) 10 MG tablet Take 10 mg by mouth daily.   Yes [provider]  cloNIDine (CATAPRES) 0.3 MG tablet Take 1 tablet (0.3 mg total) by mouth 2 (two) times daily. 11/27/15  Yes Focht, Jessica L, PA  gabapentin (NEURONTIN) 300 MG capsule Take 1 capsule (300 mg total) by mouth 3 (three) times daily. 02/15/17  Yes Dorena Bodo, NP  glimepiride (AMARYL) 4 MG tablet Take 1 tablet (4 mg total) by mouth daily with breakfast. 11/27/15  Yes Focht, Jessica L, PA  hydrochlorothiazide (HYDRODIURIL) 25 MG tablet Take 25 mg by mouth daily.  07/21/11  Yes Sandford Craze, NP  insulin detemir (LEVEMIR FLEXPEN) 100 UNIT/ML injection Inject 40 Units into the skin 2 (two) times daily. Patient taking differently: Inject 70 Units into the skin 2 (two) times daily.  08/14/11  Yes Sandford Craze, NP  losartan (COZAAR) 50 MG tablet Take 1 tablet (50 mg total) by mouth daily. 11/27/15  Yes Focht, Jessica L, PA  metoprolol tartrate (LOPRESSOR) 50 MG  tablet Take 50 mg by mouth 2 (two) times daily.   Yes [provider]  simvastatin (ZOCOR) 20 MG tablet Take 1 tablet (20 mg total) by mouth daily. 11/27/15  Yes Focht, Jessica L, PA  sodium bicarbonate 650 MG tablet Take 1,300 mg by mouth 2 (two) times daily.   Yes [provider]  Insulin Pen Needle 31G X 5 MM MISC Use with insulin injection two times a day. 03/21/11   Sandford Craze'Sullivan, Melissa, NP  Multiple Vitamins-Minerals (MULTIPLE VITAMINS/WOMENS PO) Take 1 tablet by mouth daily.      [provider]    Family History Family History  Problem Relation Age of Onset  . Diabetes Mother   . Heart  disease Mother   . Hyperlipidemia Mother   . Cancer Father        lung, non-smoker. deceased 6742yr  . Diabetes Sister   . Heart disease Brother        MI @ 2444    Social History Social History  Substance Use Topics  . Smoking status: Current Every Day Smoker    Years: 17.00    Types: Cigarettes  . Smokeless tobacco: Not on file     Comment: 2-3 cigarettes daily- started at 51 yrs old  . Alcohol use No     Allergies   Patient has no known allergies.   Review of Systems Review of Systems  Constitutional: Positive for fatigue and unexpected weight change. Negative for chills and fever.  HENT: Negative for congestion, rhinorrhea and sore throat.   Eyes: Negative for visual disturbance.  Respiratory: Positive for shortness of breath. Negative for cough and wheezing.   Cardiovascular: Positive for leg swelling. Negative for chest pain.  Gastrointestinal: Negative for abdominal pain, diarrhea, nausea and vomiting.  Genitourinary: Negative for dysuria, flank pain, vaginal bleeding and vaginal discharge.  Musculoskeletal: Negative for neck pain.  Skin: Negative for rash.  Allergic/Immunologic: Negative for immunocompromised state.  Neurological: Negative for syncope and headaches.  Hematological: Does not bruise/bleed easily.     Physical Exam Updated Vital Signs BP (!) 157/91   Pulse 74   Temp 98.2 F (36.8 C) (Oral)   Resp 19   Ht 5\' 5"  (1.651 m)   Wt 92.1 kg (203 lb 1.6 oz)   LMP 03/26/2017   SpO2 100%   BMI 33.80 kg/m   Physical Exam  Constitutional: She is oriented to person, place, and time. She appears well-developed and well-nourished. No distress.  HENT:  Head: Normocephalic and atraumatic.  Eyes: Conjunctivae are normal.  Neck: Neck supple.  Cardiovascular: Normal rate, regular rhythm and normal heart sounds.  Exam reveals no friction rub.   No murmur heard. Pulmonary/Chest: Effort normal and breath sounds normal. No respiratory distress. She has no  wheezes. She has no rales.  Abdominal: She exhibits distension.  Musculoskeletal: She exhibits edema (3+ pitting edema b/l with significant, diffuse TTP).  Neurological: She is alert and oriented to person, place, and time. She exhibits normal muscle tone.  Skin: Skin is warm. Capillary refill takes less than 2 seconds.  Psychiatric: She has a normal mood and affect.  Nursing note and vitals reviewed.    ED Treatments / Results  Labs (all labs ordered are listed, but only abnormal results are displayed) Labs Reviewed  CBC WITH DIFFERENTIAL/PLATELET - Abnormal; Notable for the following:       Result Value   WBC 12.2 (*)    RBC 2.83 (*)    Hemoglobin 8.2 (*)  HCT 26.3 (*)    RDW 15.7 (*)    Neutro Abs 9.1 (*)    All other components within normal limits  COMPREHENSIVE METABOLIC PANEL - Abnormal; Notable for the following:    Potassium 6.1 (*)    CO2 13 (*)    Glucose, Bld 166 (*)    BUN 89 (*)    Creatinine, Ser 13.26 (*)    Calcium 8.1 (*)    Albumin 2.8 (*)    AST 11 (*)    ALT 11 (*)    GFR calc non Af Amer 3 (*)    GFR calc Af Amer 3 (*)    All other components within normal limits  URINALYSIS, ROUTINE W REFLEX MICROSCOPIC - Abnormal; Notable for the following:    Glucose, UA 150 (*)    Protein, ur 100 (*)    Leukocytes, UA TRACE (*)    Bacteria, UA RARE (*)    Squamous Epithelial / LPF 6-30 (*)    All other components within normal limits  BASIC METABOLIC PANEL - Abnormal; Notable for the following:    Potassium 5.7 (*)    CO2 15 (*)    Glucose, Bld 147 (*)    BUN 89 (*)    Creatinine, Ser 13.13 (*)    Calcium 8.1 (*)    GFR calc non Af Amer 3 (*)    GFR calc Af Amer 3 (*)    All other components within normal limits  HEMOGLOBIN A1C - Abnormal; Notable for the following:    Hgb A1c MFr Bld 7.8 (*)    All other components within normal limits  RENAL FUNCTION PANEL - Abnormal; Notable for the following:    Potassium 5.7 (*)    CO2 16 (*)    Glucose, Bld  147 (*)    BUN 90 (*)    Creatinine, Ser 12.99 (*)    Calcium 7.9 (*)    Phosphorus 8.8 (*)    Albumin 2.5 (*)    GFR calc non Af Amer 3 (*)    GFR calc Af Amer 3 (*)    All other components within normal limits  CBC - Abnormal; Notable for the following:    RBC 2.55 (*)    Hemoglobin 7.6 (*)    HCT 23.5 (*)    RDW 15.7 (*)    All other components within normal limits  IRON AND TIBC - Abnormal; Notable for the following:    Iron 22 (*)    Saturation Ratios 8 (*)    All other components within normal limits  GLUCOSE, CAPILLARY - Abnormal; Notable for the following:    Glucose-Capillary 151 (*)    All other components within normal limits  I-STAT BETA HCG BLOOD, ED ([facility], [facility], [facility] ONLY) - Abnormal; Notable for the following:    I-stat hCG, quantitative 6.1 (*)    All other components within normal limits  BRAIN NATRIURETIC PEPTIDE  FERRITIN  GLUCOSE, CAPILLARY  HIV ANTIBODY (ROUTINE TESTING)  PARATHYROID HORMONE, INTACT (NO CA)    EKG  EKG Interpretation  Date/Time:  Friday March 30 2017 22:10:19 EDT Ventricular Rate:  70 PR Interval:    QRS Duration: 103 QT Interval:  437 QTC Calculation: 472 R Axis:   46 Text Interpretation:  Sinus rhythm Low voltage, precordial leads Borderline T abnormalities, lateral leads No significant change since last tracing Confirmed by Shaune Pollack [id number]) on 03/31/2017 2:58:58 AM Also confirmed by Shaune Pollack [phone]), editor Misty Stanley [id number])  on  03/31/2017 9:09:50 AM       Radiology Dg Chest 2 View  Result Date: 05/01/2017 CLINICAL DATA:  Bilateral leg swelling and shooting pain. EXAM: CHEST  2 VIEW COMPARISON:  None. FINDINGS: Markedly limited study due to body habitus. No pneumothorax. Cardiomegaly. The hila and mediastinum are unremarkable. Haziness over the lung bases is suspected to be due to overlapping soft tissues. No overt edema. IMPRESSION: Markedly limited study due to body habitus. Haziness over the bases is  favored to be due to overlapping soft tissues. Cardiomegaly. No overt edema. Electronically Signed   By: Gerome Sam III M.D   On: 13-Apr-2017 18:06   US Renal  Result Date: 03/31/2017 CLINICAL DATA:  Kidney failure. EXAM: RENAL / URINARY TRACT ULTRASOUND COMPLETE COMPARISON:  05/04/2014 FINDINGS: Right Kidney: Length: 11.5 cm. There is diffusely increased parenchymal echotexture of the right kidney suggesting chronic medical renal disease. Multiple cysts are demonstrated, including a 2.4 cm max diameter cyst in the upper pole and a 1.7 cm max diameter cyst in the lower pole. No hydronephrosis or solid mass lesions identified. Left Kidney: Length: 10.4 cm. Limited visualization of the left kidney due to body habitus. No hydronephrosis is suggested. Detail is otherwise limited. Bladder: Bladder is decompressed and is not evaluated. IMPRESSION: Diffusely increased parenchymal echotexture in the right kidney suggesting medical renal disease. No hydronephrosis. Right renal cysts. Limited visualization of the left kidney but no hydronephrosis is seen. Electronically Signed   By: Burman Nieves M.D.   On: 03/31/2017 00:49   Ir Fluoro Guide Cv Line Right  Result Date: 03/31/2017 INDICATION: END-STAGE RENAL DISEASE, NO CURRENT ACCESS EXAM: ULTRASOUND GUIDANCE FOR VASCULAR ACCESS RIGHT INTERNAL JUGULAR PERMANENT HEMODIALYSIS CATHETER Date:  10/20/201810/20/2018 11:03 am Radiologist:  M. Ruel Favors, MD Guidance:  Ultrasound and fluoroscopic FLUOROSCOPY TIME:  Fluoroscopy Time: 1 minutes 0 seconds (14.6 mGy). MEDICATIONS: Ancef 2 g administered within 1 hour of the procedure ANESTHESIA/SEDATION: Versed 1.0 mg IV; Fentanyl 25 mcg IV; Moderate Sedation Time:  20 minutes The patient was continuously monitored during the procedure by the interventional radiology nurse under my direct supervision. CONTRAST:  None. COMPLICATIONS: None immediate. PROCEDURE: Informed consent was obtained from the patient following  explanation of the procedure, risks, benefits and alternatives. The patient understands, agrees and consents for the procedure. All questions were addressed. A time out was performed. Maximal barrier sterile technique utilized including caps, mask, sterile gowns, sterile gloves, large sterile drape, hand hygiene, and 2% chlorhexidine scrub. Under sterile conditions and local anesthesia, right internal jugular micropuncture venous access was performed with ultrasound. Images were obtained for documentation. A guide wire was inserted followed by a transitional dilator. Next, a 0.035 guidewire was advanced into the IVC with a 5-French catheter. Measurements were obtained from the right venotomy site to the proximal right atrium. In the right infraclavicular chest, a subcutaneous tunnel was created under sterile conditions and local anesthesia. 1% lidocaine with epinephrine was utilized for this. The 23 cm tip to cuff dialysis catheter was tunneled subcutaneously to the venotomy site and inserted into the SVC/RA junction through a valved peel-away sheath. Position was confirmed with fluoroscopy. Images were obtained for documentation. Blood was aspirated from the catheter followed by saline and heparin flushes. The appropriate volume and strength of heparin was instilled in each lumen. Caps were applied. The catheter was secured at the tunnel site with Gelfoam and a pursestring suture. The venotomy site was closed with subcuticular Vicryl suture. Dermabond was applied to the small right neck  incision. A dry sterile dressing was applied. The catheter is ready for use. No immediate complications. IMPRESSION: Ultrasound and fluoroscopically guided right internal jugular tunneled hemodialysis catheter (23 cm tip to cuff dialysis palindrome catheter). Electronically Signed   By: Judie Petit.  Shick M.D.   On: 03/31/2017 11:07   Ir US Guide Vasc Access Right  Result Date: 03/31/2017 INDICATION: END-STAGE RENAL DISEASE, NO CURRENT  ACCESS EXAM: ULTRASOUND GUIDANCE FOR VASCULAR ACCESS RIGHT INTERNAL JUGULAR PERMANENT HEMODIALYSIS CATHETER Date:  10/20/201810/20/2018 11:03 am Radiologist:  M. Ruel Favors, MD Guidance:  Ultrasound and fluoroscopic FLUOROSCOPY TIME:  Fluoroscopy Time: 1 minutes 0 seconds (14.6 mGy). MEDICATIONS: Ancef 2 g administered within 1 hour of the procedure ANESTHESIA/SEDATION: Versed 1.0 mg IV; Fentanyl 25 mcg IV; Moderate Sedation Time:  20 minutes The patient was continuously monitored during the procedure by the interventional radiology nurse under my direct supervision. CONTRAST:  None. COMPLICATIONS: None immediate. PROCEDURE: Informed consent was obtained from the patient following explanation of the procedure, risks, benefits and alternatives. The patient understands, agrees and consents for the procedure. All questions were addressed. A time out was performed. Maximal barrier sterile technique utilized including caps, mask, sterile gowns, sterile gloves, large sterile drape, hand hygiene, and 2% chlorhexidine scrub. Under sterile conditions and local anesthesia, right internal jugular micropuncture venous access was performed with ultrasound. Images were obtained for documentation. A guide wire was inserted followed by a transitional dilator. Next, a 0.035 guidewire was advanced into the IVC with a 5-French catheter. Measurements were obtained from the right venotomy site to the proximal right atrium. In the right infraclavicular chest, a subcutaneous tunnel was created under sterile conditions and local anesthesia. 1% lidocaine with epinephrine was utilized for this. The 23 cm tip to cuff dialysis catheter was tunneled subcutaneously to the venotomy site and inserted into the SVC/RA junction through a valved peel-away sheath. Position was confirmed with fluoroscopy. Images were obtained for documentation. Blood was aspirated from the catheter followed by saline and heparin flushes. The appropriate volume and  strength of heparin was instilled in each lumen. Caps were applied. The catheter was secured at the tunnel site with Gelfoam and a pursestring suture. The venotomy site was closed with subcuticular Vicryl suture. Dermabond was applied to the small right neck incision. A dry sterile dressing was applied. The catheter is ready for use. No immediate complications. IMPRESSION: Ultrasound and fluoroscopically guided right internal jugular tunneled hemodialysis catheter (23 cm tip to cuff dialysis palindrome catheter). Electronically Signed   By: Judie Petit.  Shick M.D.   On: 03/31/2017 11:07    Procedures .Critical Care Performed by: Shaune Pollack Authorized by: Shaune Pollack   Critical care provider statement:    Critical care time (minutes):  35   Critical care time was exclusive of:  Separately billable procedures and treating other patients and teaching time   Critical care was necessary to treat or prevent imminent or life-threatening deterioration of the following conditions:  Renal failure and metabolic crisis   Critical care was time spent personally by me on the following activities:  Development of treatment plan with patient or surrogate, discussions with consultants, evaluation of patient's response to treatment, examination of patient, obtaining history from patient or surrogate, ordering and performing treatments and interventions, ordering and review of laboratory studies, ordering and review of radiographic studies, pulse oximetry, re-evaluation of patient's condition and review of old charts   I assumed direction of critical care for this patient from another provider in my specialty: no      (  including critical care time)  Medications Ordered in ED Medications  gabapentin (NEURONTIN) capsule 300 mg (300 mg Oral Given 03/31/17 0816)  amLODipine (NORVASC) tablet 10 mg (10 mg Oral Not Given 03/31/17 1000)  cloNIDine (CATAPRES) tablet 0.3 mg (0.3 mg Oral Not Given 03/31/17 1000)  sodium  bicarbonate tablet 1,300 mg (1,300 mg Oral Given 04/07/2017 2311)  simvastatin (ZOCOR) tablet 20 mg (not administered)  metoprolol tartrate (LOPRESSOR) tablet 50 mg (50 mg Oral Given 03/24/2017 2247)  insulin aspart (novoLOG) injection 0-15 Units (0 Units Subcutaneous Not Given 03/31/17 0800)  insulin detemir (LEVEMIR) injection 45 Units (45 Units Subcutaneous Given 04/08/2017 2316)  acetaminophen (TYLENOL) tablet 650 mg (not administered)    Or  acetaminophen (TYLENOL) suppository 650 mg (not administered)  ondansetron (ZOFRAN) tablet 4 mg (not administered)    Or  ondansetron (ZOFRAN) injection 4 mg (not administered)  furosemide (LASIX) 120 mg in dextrose 5 % 50 mL IVPB (0 mg Intravenous Stopped 03/31/17 0948)  HYDROcodone-acetaminophen (NORCO/VICODIN) 5-325 MG per tablet 1 tablet (1 tablet Oral Given 03/31/17 0812)  gelatin adsorbable (GELFOAM/SURGIFOAM) 12-7 MM sponge 12-7 mm (not administered)  lidocaine (XYLOCAINE) 1 % (with pres) injection (not administered)  fentaNYL (SUBLIMAZE) 100 MCG/2ML injection (not administered)  midazolam (VERSED) 2 MG/2ML injection (not administered)  ceFAZolin (ANCEF) 2-4 GM/100ML-% IVPB (not administered)  lidocaine (PF) (XYLOCAINE) 1 % injection (10 mLs Infiltration Given 03/31/17 1115)  HYDROcodone-acetaminophen (NORCO/VICODIN) 5-325 MG per tablet 2 tablet (2 tablets Oral Given 04/02/2017 1710)  gabapentin (NEURONTIN) capsule 100 mg (100 mg Oral Given 03/29/2017 1710)  sodium bicarbonate injection 50 mEq (50 mEq Intravenous Given 03/16/2017 2240)  insulin aspart (novoLOG) injection 5 Units (5 Units Intravenous Given 03/24/2017 2244)  dextrose 50 % solution 50 mL (50 mLs Intravenous Given 04/11/2017 2246)  calcium gluconate 1 g in sodium chloride 0.9 % 100 mL IVPB (0 g Intravenous Stopped 03/31/17 0005)  furosemide (LASIX) injection 80 mg (80 mg Intravenous Given 04/01/2017 2252)  heparin 1000 UNIT/ML injection (3.8 mLs  Given 03/31/17 1115)  midazolam (VERSED) 5 MG/5ML  injection (1 mg Intravenous Given 03/31/17 1043)  fentaNYL (SUBLIMAZE) injection (25 mcg Intravenous Given 03/31/17 1043)  ceFAZolin (ANCEF) IVPB 2g/100 mL premix (2 g Intravenous New Bag/Given 03/31/17 1043)     Initial Impression / Assessment and Plan / ED Course  I have reviewed the triage vital signs and the nursing notes.  Pertinent labs & imaging results that were available during my care of the patient were reviewed by me and considered in my medical decision making (see chart for details).     51 yo F with PMHx as above including known CKD here with worsening b/l leg swelling and abdominal swelling. Lab work is c/f progression to ESRD with K 7.1, Cr 13, CO2 13. EKG without peaked T waves or widened QRS. Hgb 8.2, likely 2/2 her CKD. I suspect pt has unfortunately progressed to ESRD. Discussed with Dr. Meyer Cory with Nephrology. Pt temporized with bicarb, insulin, lasix and will admit to medicine service. Pt updated and aware. Will have cath placed by IR for emergent dialysis.  Final Clinical Impressions(s) / ED Diagnoses   Final diagnoses:  Acute kidney injury (HCC)  Hyperkalemia  Acidemia    New Prescriptions Current Discharge Medication List       Shaune Pollack, MD 03/31/17 1118

## 2017-03-30 NOTE — ED Triage Notes (Signed)
Pt to Ed for evaluation of bilateral leg swelling with intermittent shooting pain that worsens with movement over the last few weeks. 2-3 pitting edema. Legs not warm to touch but pt endorses burning sensation. Pt reports kidney failure but no dialysis.

## 2017-03-31 ENCOUNTER — Inpatient Hospital Stay (HOSPITAL_COMMUNITY): Payer: BLUE CROSS/BLUE SHIELD

## 2017-03-31 ENCOUNTER — Encounter (HOSPITAL_COMMUNITY): Payer: Self-pay | Admitting: Interventional Radiology

## 2017-03-31 DIAGNOSIS — E872 Acidosis: Secondary | ICD-10-CM

## 2017-03-31 DIAGNOSIS — E875 Hyperkalemia: Secondary | ICD-10-CM

## 2017-03-31 HISTORY — PX: IR FLUORO GUIDE CV LINE RIGHT: IMG2283

## 2017-03-31 HISTORY — PX: IR US GUIDE VASC ACCESS RIGHT: IMG2390

## 2017-03-31 LAB — BASIC METABOLIC PANEL
Anion gap: 12 (ref 5–15)
BUN: 89 mg/dL — ABNORMAL HIGH (ref 6–20)
CHLORIDE: 110 mmol/L (ref 101–111)
CO2: 15 mmol/L — ABNORMAL LOW (ref 22–32)
CREATININE: 13.13 mg/dL — AB (ref 0.44–1.00)
Calcium: 8.1 mg/dL — ABNORMAL LOW (ref 8.9–10.3)
GFR, EST AFRICAN AMERICAN: 3 mL/min — AB (ref >60)
GFR, EST NON AFRICAN AMERICAN: 3 mL/min — AB (ref >60)
Glucose, Bld: 147 mg/dL — ABNORMAL HIGH (ref 65–99)
POTASSIUM: 5.7 mmol/L — AB (ref 3.5–5.1)
SODIUM: 137 mmol/L (ref 135–145)

## 2017-03-31 LAB — URINALYSIS, ROUTINE W REFLEX MICROSCOPIC
Bilirubin Urine: NEGATIVE
GLUCOSE, UA: 150 mg/dL — AB
Hgb urine dipstick: NEGATIVE
KETONES UR: NEGATIVE mg/dL
Nitrite: NEGATIVE
PROTEIN: 100 mg/dL — AB
Specific Gravity, Urine: 1.014 (ref 1.005–1.030)
pH: 6 (ref 5.0–8.0)

## 2017-03-31 LAB — HEMOGLOBIN A1C
HEMOGLOBIN A1C: 7.8 % — AB (ref 4.8–5.6)
MEAN PLASMA GLUCOSE: 177.16 mg/dL

## 2017-03-31 LAB — CBC
HCT: 23.5 % — ABNORMAL LOW (ref 36.0–46.0)
HEMATOCRIT: 24.3 % — AB (ref 36.0–46.0)
HEMOGLOBIN: 7.6 g/dL — AB (ref 12.0–15.0)
Hemoglobin: 7.6 g/dL — ABNORMAL LOW (ref 12.0–15.0)
MCH: 29.8 pg (ref 26.0–34.0)
MCH: 29 pg (ref 26.0–34.0)
MCHC: 32.3 g/dL (ref 30.0–36.0)
MCHC: 31.3 g/dL (ref 30.0–36.0)
MCV: 92.2 fL (ref 78.0–100.0)
MCV: 92.7 fL (ref 78.0–100.0)
PLATELETS: 301 10*3/uL (ref 150–400)
Platelets: 262 10*3/uL (ref 150–400)
RBC: 2.55 MIL/uL — ABNORMAL LOW (ref 3.87–5.11)
RBC: 2.62 MIL/uL — ABNORMAL LOW (ref 3.87–5.11)
RDW: 15.7 % — AB (ref 11.5–15.5)
RDW: 15.4 % (ref 11.5–15.5)
WBC: 9.9 10*3/uL (ref 4.0–10.5)
WBC: 12.2 10*3/uL — ABNORMAL HIGH (ref 4.0–10.5)

## 2017-03-31 LAB — GLUCOSE, CAPILLARY
GLUCOSE-CAPILLARY: 151 mg/dL — AB (ref 65–99)
GLUCOSE-CAPILLARY: 75 mg/dL (ref 65–99)
Glucose-Capillary: 84 mg/dL (ref 65–99)
Glucose-Capillary: 62 mg/dL — ABNORMAL LOW (ref 65–99)
Glucose-Capillary: 103 mg/dL — ABNORMAL HIGH (ref 65–99)

## 2017-03-31 LAB — RENAL FUNCTION PANEL
ALBUMIN: 2.5 g/dL — AB (ref 3.5–5.0)
Albumin: 2.6 g/dL — ABNORMAL LOW (ref 3.5–5.0)
Anion gap: 12 (ref 5–15)
Anion gap: 13 (ref 5–15)
BUN: 90 mg/dL — AB (ref 6–20)
BUN: 92 mg/dL — AB (ref 6–20)
CHLORIDE: 108 mmol/L (ref 101–111)
CO2: 16 mmol/L — AB (ref 22–32)
CO2: 16 mmol/L — ABNORMAL LOW (ref 22–32)
CREATININE: 13.13 mg/dL — AB (ref 0.44–1.00)
Calcium: 7.9 mg/dL — ABNORMAL LOW (ref 8.9–10.3)
Calcium: 7.9 mg/dL — ABNORMAL LOW (ref 8.9–10.3)
Chloride: 109 mmol/L (ref 101–111)
Creatinine, Ser: 12.99 mg/dL — ABNORMAL HIGH (ref 0.44–1.00)
GFR calc Af Amer: 3 mL/min — ABNORMAL LOW (ref >60)
GFR calc Af Amer: 3 mL/min — ABNORMAL LOW (ref >60)
GFR calc non Af Amer: 3 mL/min — ABNORMAL LOW (ref >60)
GFR calc non Af Amer: 3 mL/min — ABNORMAL LOW (ref >60)
GLUCOSE: 147 mg/dL — AB (ref 65–99)
GLUCOSE: 109 mg/dL — AB (ref 65–99)
PHOSPHORUS: 8.8 mg/dL — AB (ref 2.5–4.6)
POTASSIUM: 5.7 mmol/L — AB (ref 3.5–5.1)
POTASSIUM: 7.2 mmol/L — AB (ref 3.5–5.1)
Phosphorus: 8.6 mg/dL — ABNORMAL HIGH (ref 2.5–4.6)
SODIUM: 137 mmol/L (ref 135–145)
SODIUM: 137 mmol/L (ref 135–145)

## 2017-03-31 LAB — IRON AND TIBC
Iron: 22 ug/dL — ABNORMAL LOW (ref 28–170)
Saturation Ratios: 8 % — ABNORMAL LOW (ref 10.4–31.8)
TIBC: 259 ug/dL (ref 250–450)
UIBC: 237 ug/dL

## 2017-03-31 LAB — PARATHYROID HORMONE, INTACT (NO CA): PTH: 283 pg/mL — AB (ref 15–65)

## 2017-03-31 LAB — HIV ANTIBODY (ROUTINE TESTING W REFLEX): HIV Screen 4th Generation wRfx: NONREACTIVE

## 2017-03-31 LAB — FERRITIN: FERRITIN: 73 ng/mL (ref 11–307)

## 2017-03-31 MED ORDER — LIDOCAINE HCL 1 % IJ SOLN
INTRAMUSCULAR | Status: AC
  Filled 2017-03-31: qty 20

## 2017-03-31 MED ORDER — LIDOCAINE HCL (PF) 1 % IJ SOLN
INTRAMUSCULAR | Status: DC | PRN
  Administered 2017-03-31: 10 mL

## 2017-03-31 MED ORDER — HYDROCODONE-ACETAMINOPHEN 5-325 MG PO TABS
1.0000 | ORAL_TABLET | ORAL | Status: DC | PRN
  Administered 2017-03-31 – 2017-04-03 (×7): 1 via ORAL
  Filled 2017-03-31 (×5): qty 1

## 2017-03-31 MED ORDER — CEFAZOLIN SODIUM-DEXTROSE 2-4 GM/100ML-% IV SOLN
2.0000 g | Freq: Once | INTRAVENOUS | Status: AC
  Administered 2017-03-31: 2 g via INTRAVENOUS
  Filled 2017-03-31: qty 100

## 2017-03-31 MED ORDER — MIDAZOLAM HCL 2 MG/2ML IJ SOLN
INTRAMUSCULAR | Status: AC
  Filled 2017-03-31: qty 2

## 2017-03-31 MED ORDER — DEXTROSE 50 % IV SOLN
INTRAVENOUS | Status: AC
  Administered 2017-03-31: 50 mL
  Filled 2017-03-31: qty 50

## 2017-03-31 MED ORDER — LIDOCAINE HCL (PF) 1 % IJ SOLN: 5.0000 mL | INTRAMUSCULAR | Status: DC | PRN

## 2017-03-31 MED ORDER — FENTANYL CITRATE (PF) 100 MCG/2ML IJ SOLN
INTRAMUSCULAR | Status: AC
  Filled 2017-03-31: qty 2

## 2017-03-31 MED ORDER — PENTAFLUOROPROP-TETRAFLUOROETH EX AERO: 1.0000 "application " | INHALATION_SPRAY | CUTANEOUS | Status: DC | PRN

## 2017-03-31 MED ORDER — SODIUM CHLORIDE 0.9 % IV SOLN: 100.0000 mL | INTRAVENOUS | Status: DC | PRN

## 2017-03-31 MED ORDER — FENTANYL CITRATE (PF) 100 MCG/2ML IJ SOLN
INTRAMUSCULAR | Status: AC | PRN
  Administered 2017-03-31: 25 ug via INTRAVENOUS

## 2017-03-31 MED ORDER — GELATIN ABSORBABLE 12-7 MM EX MISC
CUTANEOUS | Status: AC
  Filled 2017-03-31: qty 1

## 2017-03-31 MED ORDER — CEFAZOLIN SODIUM-DEXTROSE 2-4 GM/100ML-% IV SOLN
INTRAVENOUS | Status: AC
  Filled 2017-03-31: qty 100

## 2017-03-31 MED ORDER — MIDAZOLAM HCL 5 MG/5ML IJ SOLN
INTRAMUSCULAR | Status: AC | PRN
  Administered 2017-03-31: 1 mg via INTRAVENOUS

## 2017-03-31 MED ORDER — HEPARIN SODIUM (PORCINE) 1000 UNIT/ML DIALYSIS: 1000.0000 [IU] | INTRAMUSCULAR | Status: DC | PRN

## 2017-03-31 MED ORDER — ALTEPLASE 2 MG IJ SOLR: 2.0000 mg | Freq: Once | INTRAMUSCULAR | Status: DC | PRN

## 2017-03-31 MED ORDER — HEPARIN SODIUM (PORCINE) 5000 UNIT/ML IJ SOLN
5000.0000 [IU] | Freq: Three times a day (TID) | INTRAMUSCULAR | Status: DC
  Administered 2017-03-31 – 2017-04-17 (×39): 5000 [IU] via SUBCUTANEOUS
  Filled 2017-03-31 (×37): qty 1

## 2017-03-31 MED ORDER — HEPARIN SODIUM (PORCINE) 1000 UNIT/ML IJ SOLN
INTRAMUSCULAR | Status: AC
  Administered 2017-03-31: 3.8 mL
  Filled 2017-03-31: qty 1

## 2017-03-31 MED ORDER — LIDOCAINE-PRILOCAINE 2.5-2.5 % EX CREA: 1.0000 "application " | TOPICAL_CREAM | CUTANEOUS | Status: DC | PRN

## 2017-03-31 NOTE — ED Notes (Signed)
Pt taken to US

## 2017-03-31 NOTE — Progress Notes (Signed)
Patient arrived to unit by bed.  Reviewed treatment plan and this RN agrees with plan.  Report received from bedside RN, Albin Fellingarla.  Consent obtained.  Patient Responds to voice, oriented to self and place, unable to finish a thought before falling asleep.   Lung sounds diminished to ausculation in all fields. BLE 4+ edema. Cardiac:  NSr.  Removed caps and cleansed RIJ catheter with chlorhedxidine.  Aspirated ports of heparin and flushed them with saline per protocol.  Connected and secured lines, initiated treatment at 1744.  UF Goal of 3000 mL and net fluid removal 2.5 L.  Will continue to monitor.

## 2017-03-31 NOTE — ED Notes (Signed)
attempted to call report will try again.

## 2017-03-31 NOTE — Progress Notes (Signed)
Hypoglycemic Event  CBG: 62 at 2110  Treatment: D50 IV 50 mL  Symptoms: None   Follow-up CBG: Time:2154 CBG Result:103  Possible Reasons for Event: Inadequate meal intake  Comments/MD notified:MD Sofia notified    Teresa Dalton Teresa Dalton

## 2017-03-31 NOTE — Progress Notes (Signed)
PT very sleepy and agitated when she was woke up to do skin assessment. Pt refused to have assessment done.   Larey Dayshristy M Kahlani Graber, RN

## 2017-03-31 NOTE — Sedation Documentation (Signed)
Patient denies pain and is resting comfortably.  

## 2017-03-31 NOTE — Progress Notes (Signed)
New Admission Note:  Arrival Method: By bed from the ED around 0200 Mental Orientation: Responds to voice, very drowsy Telemetry: Box 2, CCMD notified Assessment: Pt refused Skin: Pt refused IV: Left AC Pain: Denies Tubes: None Safety Measures: Safety Fall Prevention Plan was given, discussed  Admission: Pt refused to answer questions 2 [facility] Orientation: Patient has been orientated to the room, unit and the staff. Family: None  Orders have been reviewed and implemented. Will continue to monitor the patient. Call light has been placed within reach.  Alfonse Rashristy Mekhia Brogan, RN  Phone Number: [phone]

## 2017-03-31 NOTE — Sedation Documentation (Signed)
Vital signs stable. 

## 2017-03-31 NOTE — Progress Notes (Signed)
Lab unable to draw peripherally. Messaged MD to see if we can get an order to draw DBIV.   Larey Dayshristy M Sasan Wilkie, RN

## 2017-03-31 NOTE — Progress Notes (Signed)
Dialysis treatment completed.  3000 mL ultrafiltrated.  2500 mL net fluid removal.  Patient status unchanged. Lung sounds diminished to ausculation in all fields. BLE 3+ edema. Cardiac: NSr.  Cleansed RIJ catheter with chlorhexidine.  Disconnected lines and flushed ports with saline per protocol.  Ports locked with heparin and capped per protocol.    Report given to bedside, RN Christy.

## 2017-03-31 NOTE — Procedures (Signed)
esrd  S/p RT IJ HD TUNNELED CATH  TIP SVCRA NO COMP STABLE READY FOR USE FULL REPORT IN PACS EBL 5CC

## 2017-03-31 NOTE — Progress Notes (Signed)
Spoke with MD and was told to hold all medications for the time being and that she was going to put in labs to be drawn at this time.  Larey Dayshristy M Lorretta Kerce, RN

## 2017-03-31 NOTE — Progress Notes (Signed)
Pt is very irritable and will not keep arm straight so IV Lasix can infuse. Pt refused to have another IV put in. Pt does not want staff touching her and stated " all of you are crazy." This nurse messaged MD to make aware that pt is not getting her medication d/t non compliance and not wanting to keep her arm straight and not wanting staff to touch or even interact with her.   Larey Dayshristy M Redding Cloe, RN

## 2017-03-31 NOTE — Progress Notes (Signed)
Chief Complaint: Patient was seen in consultation today for HD cath placement  at the request of Dr. Arrie Aran  Referring Physician(s): Dr. Arrie Aran  Supervising Physician: Ruel Favors  Patient Status: [facility] - In-pt  History of Present Illness: Teresa Dalton. is a 51 y.o. female with progressive CKD now in need of HD. She has been admitted due to associated volume overload symptoms. IR is asked to place tunneled HD cath so that HD can begin. PMHx, meds, labs, imaging reviewed. Has been NPO this am. Has not received any thinners.  Past Medical History:  Diagnosis Date  . Chronic kidney disease    renal insufficiency (1.6-1.9 creatinine)  . Diabetes mellitus   . Hyperlipidemia   . Hypertension   . Increased frequency of headaches   . Thyroid disease    ? past problem    Past Surgical History:  Procedure Laterality Date  . CESAREAN SECTION     x 2  . CYSTECTOMY  2001   thyroid    Allergies: Patient has no known allergies.  Medications:  Current Facility-Administered Medications:  .  gelatin adsorbable (GELFOAM/SURGIFOAM) 12-7 MM sponge 12-7 mm, , , ,  .  heparin 1000 UNIT/ML injection, , , ,  .  lidocaine (XYLOCAINE) 1 % (with pres) injection, , , ,  .  acetaminophen (TYLENOL) tablet 650 mg, 650 mg, Oral, Q6H PRN **OR** acetaminophen (TYLENOL) suppository 650 mg, 650 mg, Rectal, Q6H PRN, Julian Reil, Jared M, DO .  amLODipine (NORVASC) tablet 10 mg, 10 mg, Oral, Daily, Julian Reil, Jared M, DO .  cloNIDine (CATAPRES) tablet 0.3 mg, 0.3 mg, Oral, BID, Julian Reil, Jared M, DO, 0.3 mg at 03/29/2017 2247 .  furosemide (LASIX) 120 mg in dextrose 5 % 50 mL IVPB, 120 mg, Intravenous, Q6H, Coladonato, Jomarie Longs, MD, Last Rate: 62 mL/hr at 03/31/17 0848, 120 mg at 03/31/17 0848 .  gabapentin (NEURONTIN) capsule 300 mg, 300 mg, Oral, TID, Hillary Bow, DO, 300 mg at 03/31/17 0816 .  HYDROcodone-acetaminophen (NORCO/VICODIN) 5-325 MG per tablet 1 tablet, 1 tablet, Oral, Q4H  PRN, Calvert Cantor, MD, 1 tablet at 03/31/17 0812 .  insulin aspart (novoLOG) injection 0-15 Units, 0-15 Units, Subcutaneous, TID WC, Gardner, Jared M, DO .  insulin detemir (LEVEMIR) injection 45 Units, 45 Units, Subcutaneous, BID, Hillary Bow, DO, 45 Units at 04/06/2017 2316 .  metoprolol tartrate (LOPRESSOR) tablet 50 mg, 50 mg, Oral, BID, Hillary Bow, DO, 50 mg at 03/12/2017 2247 .  ondansetron (ZOFRAN) tablet 4 mg, 4 mg, Oral, Q6H PRN **OR** ondansetron (ZOFRAN) injection 4 mg, 4 mg, Intravenous, Q6H PRN, Julian Reil, Jared M, DO .  simvastatin (ZOCOR) tablet 20 mg, 20 mg, Oral, Daily, Julian Reil, Jared M, DO .  sodium bicarbonate tablet 1,300 mg, 1,300 mg, Oral, BID, Julian Reil, Jared M, DO, 1,300 mg at 04/10/2017 2311    Family History  Problem Relation Age of Onset  . Diabetes Mother   . Heart disease Mother   . Hyperlipidemia Mother   . Cancer Father        lung, non-smoker. deceased 8yr  . Diabetes Sister   . Heart disease Brother        MI @ 35    Social History   Social History  . Marital status: Single    Spouse name: N/A  . Number of children: 2  . Years of education: N/A   Occupational History  . [employer]   Social History Main Topics  . Smoking status: Current  Every Day Smoker    Years: 17.00    Types: Cigarettes  . Smokeless tobacco: None     Comment: 2-3 cigarettes daily- started at 51 yrs old  . Alcohol use No  . Drug use: Yes    Types: Marijuana  . Sexual activity: Not Asked   Other Topics Concern  . None   Social History Narrative   Regular exercise: no   Lives with one of her sons              Review of Systems: A 12 point ROS discussed and pertinent positives are indicated in the HPI above.  All other systems are negative.  Review of Systems  Vital Signs: BP 105/73 (BP Location: Left Arm)   Pulse 62   Temp 98.2 F (36.8 C) (Oral)   Resp 20   Ht 5\' 5"  (1.651 m)   Wt 203 lb 1.6 oz (92.1 kg)   LMP  03/26/2017   SpO2 100%   BMI 33.80 kg/m   Physical Exam  Constitutional: She is oriented to person, place, and time. She appears well-developed. No distress.  HENT:  Head: Normocephalic.  Mouth/Throat: Oropharynx is clear and moist.  Neck: Normal range of motion. No JVD present.  Cardiovascular: Normal rate, regular rhythm and normal heart sounds.   Pulmonary/Chest: Effort normal and breath sounds normal. No respiratory distress.  Neurological: She is alert and oriented to person, place, and time.  Skin: Skin is warm and dry.  Psychiatric: She has a normal mood and affect.      Imaging: Dg Chest 2 View  Result Date: 08-02-16 CLINICAL DATA:  Bilateral leg swelling and shooting pain. EXAM: CHEST  2 VIEW COMPARISON:  None. FINDINGS: Markedly limited study due to body habitus. No pneumothorax. Cardiomegaly. The hila and mediastinum are unremarkable. Haziness over the lung bases is suspected to be due to overlapping soft tissues. No overt edema. IMPRESSION: Markedly limited study due to body habitus. Haziness over the bases is favored to be due to overlapping soft tissues. Cardiomegaly. No overt edema. Electronically Signed   By: Gerome Samavid  Williams III M.D   On: 08-02-16 18:06   [place] Renal  Result Date: 03/31/2017 CLINICAL DATA:  Kidney failure. EXAM: RENAL / URINARY TRACT ULTRASOUND COMPLETE COMPARISON:  05/04/2014 FINDINGS: Right Kidney: Length: 11.5 cm. There is diffusely increased parenchymal echotexture of the right kidney suggesting chronic medical renal disease. Multiple cysts are demonstrated, including a 2.4 cm max diameter cyst in the upper pole and a 1.7 cm max diameter cyst in the lower pole. No hydronephrosis or solid mass lesions identified. Left Kidney: Length: 10.4 cm. Limited visualization of the left kidney due to body habitus. No hydronephrosis is suggested. Detail is otherwise limited. Bladder: Bladder is decompressed and is not evaluated. IMPRESSION: Diffusely increased  parenchymal echotexture in the right kidney suggesting medical renal disease. No hydronephrosis. Right renal cysts. Limited visualization of the left kidney but no hydronephrosis is seen. Electronically Signed   By: Burman NievesWilliam  Stevens M.D.   On: 03/31/2017 00:49    Labs:  CBC:  Recent Labs  06-17-16 1937 03/31/17 0215  WBC 12.2* 9.9  HGB 8.2* 7.6*  HCT 26.3* 23.5*  PLT 281 262    COAGS: No results for input(s): INR, APTT in the last 8760 hours.  BMP:  Recent Labs  06-17-16 1937 03/31/17 0215  NA 136 137  137  K 6.1* 5.7*  5.7*  CL 110 110  109  CO2 13* 15*  16*  GLUCOSE 166* 147*  147*  BUN 89* 89*  90*  CALCIUM 8.1* 8.1*  7.9*  CREATININE 13.26* 13.13*  12.99*  GFRNONAA 3* 3*  3*  GFRAA 3* 3*  3*    LIVER FUNCTION TESTS:  Recent Labs  2017-04-09 1937 03/31/17 0215  BILITOT 0.8  --   AST 11*  --   ALT 11*  --   ALKPHOS 118  --   PROT 6.8  --   ALBUMIN 2.8* 2.5*    TUMOR MARKERS: No results for input(s): AFPTM, CEA, CA199, CHROMGRNA in the last 8760 hours.  Assessment and Plan: Progressive CKD in need of HD Plan for tunneled HD cath placement Labs ok Risks and benefits discussed with the patient including, but not limited to bleeding, infection, pneumothorax, or fibrin sheath development and need for additional procedures. All of the patient's questions were answered, patient is agreeable to proceed. Consent signed and in chart.    Thank you for this interesting consult.  I greatly enjoyed meeting Teresa Castillo V. and look forward to participating in their care.  A copy of this report was sent to the requesting provider on this date.  Electronically Signed: Brayton El, PA-C 03/31/2017, 9:41 AM   I spent a total of 20 minutes in face to face in clinical consultation, greater than 50% of which was counseling/coordinating care for Permcath placement

## 2017-03-31 NOTE — Progress Notes (Signed)
MD stated just to monitor BP d/t pt BP being high in ED and pt stated that she was given medication in ED to bring it down.  Larey Dayshristy M Bradford Cazier, RN

## 2017-03-31 NOTE — Progress Notes (Signed)
Patient ID: Teresa Kayseriahann Shukla V., female   DOB: [birth year], 51 y.o.   MRN: [id number] S:still complaining of leg pain this morning O:BP 105/73 (BP Location: Left Arm)   Pulse 62   Temp 98.2 F (36.8 C) (Oral)   Resp 20   Ht 5\' 5"  (1.651 m)   Wt 92.1 kg (203 lb 1.6 oz)   LMP 03/26/2017   SpO2 100%   BMI 33.80 kg/m   Intake/Output Summary (Last 24 hours) at 03/31/17 0957 Last data filed at 03/31/17 0659  Gross per 24 hour  Intake              110 ml  Output                0 ml  Net              110 ml   Intake/Output: I/O last 3 completed shifts: In: 110 [IV Piggyback:110] Out: 0   Intake/Output this shift:  No intake/output data recorded. Weight change:  Gen: obese, NAD CVS: no rub Resp:cta Abd: obese, +BS Ext: 2+ edema   Recent Labs Lab 03/21/2017 1937 03/31/17 0215  NA 136 137  137  K 6.1* 5.7*  5.7*  CL 110 110  109  CO2 13* 15*  16*  GLUCOSE 166* 147*  147*  BUN 89* 89*  90*  CREATININE 13.26* 13.13*  12.99*  ALBUMIN 2.8* 2.5*  CALCIUM 8.1* 8.1*  7.9*  PHOS  --  8.8*  AST 11*  --   ALT 11*  --    Liver Function Tests:  Recent Labs Lab 03/13/2017 1937 03/31/17 0215  AST 11*  --   ALT 11*  --   ALKPHOS 118  --   BILITOT 0.8  --   PROT 6.8  --   ALBUMIN 2.8* 2.5*   No results for input(s): LIPASE, AMYLASE in the last 168 hours. No results for input(s): AMMONIA in the last 168 hours. CBC:  Recent Labs Lab 04/09/2017 1937 03/31/17 0215  WBC 12.2* 9.9  NEUTROABS 9.1*  --   HGB 8.2* 7.6*  HCT 26.3* 23.5*  MCV 92.9 92.2  PLT 281 262   Cardiac Enzymes: No results for input(s): CKTOTAL, CKMB, CKMBINDEX, TROPONINI in the last 168 hours. CBG:  Recent Labs Lab 03/31/17 0131 03/31/17 0845  GLUCAP 151* 84    Iron Studies:  Recent Labs  03/16/2017 2357  IRON 22*  TIBC 259  FERRITIN 73   Studies/Results: Dg Chest 2 View  Result Date: 04/07/2017 CLINICAL DATA:  Bilateral leg swelling and shooting pain. EXAM: CHEST  2 VIEW  COMPARISON:  None. FINDINGS: Markedly limited study due to body habitus. No pneumothorax. Cardiomegaly. The hila and mediastinum are unremarkable. Haziness over the lung bases is suspected to be due to overlapping soft tissues. No overt edema. IMPRESSION: Markedly limited study due to body habitus. Haziness over the bases is favored to be due to overlapping soft tissues. Cardiomegaly. No overt edema. Electronically Signed   By: Gerome Samavid  Williams III M.D   On: 03/28/2017 18:06   [place] Renal  Result Date: 03/31/2017 CLINICAL DATA:  Kidney failure. EXAM: RENAL / URINARY TRACT ULTRASOUND COMPLETE COMPARISON:  05/04/2014 FINDINGS: Right Kidney: Length: 11.5 cm. There is diffusely increased parenchymal echotexture of the right kidney suggesting chronic medical renal disease. Multiple cysts are demonstrated, including a 2.4 cm max diameter cyst in the upper pole and a 1.7 cm max diameter cyst in the lower pole. No hydronephrosis or solid mass  lesions identified. Left Kidney: Length: 10.4 cm. Limited visualization of the left kidney due to body habitus. No hydronephrosis is suggested. Detail is otherwise limited. Bladder: Bladder is decompressed and is not evaluated. IMPRESSION: Diffusely increased parenchymal echotexture in the right kidney suggesting medical renal disease. No hydronephrosis. Right renal cysts. Limited visualization of the left kidney but no hydronephrosis is seen. Electronically Signed   By: Burman Nieves M.D.   On: 03/31/2017 00:49   . gelatin adsorbable      . heparin      . lidocaine      . amLODipine  10 mg Oral Daily  . cloNIDine  0.3 mg Oral BID  . gabapentin  300 mg Oral TID  . insulin aspart  0-15 Units Subcutaneous TID WC  . insulin detemir  45 Units Subcutaneous BID  . metoprolol tartrate  50 mg Oral BID  . simvastatin  20 mg Oral Daily  . sodium bicarbonate  1,300 mg Oral BID    BMET    Component Value Date/Time   NA 137 03/31/2017 0215   NA 137 03/31/2017 0215   K 5.7  (H) 03/31/2017 0215   K 5.7 (H) 03/31/2017 0215   CL 110 03/31/2017 0215   CL 109 03/31/2017 0215   CO2 15 (L) 03/31/2017 0215   CO2 16 (L) 03/31/2017 0215   GLUCOSE 147 (H) 03/31/2017 0215   GLUCOSE 147 (H) 03/31/2017 0215   BUN 89 (H) 03/31/2017 0215   BUN 90 (H) 03/31/2017 0215   CREATININE 13.13 (H) 03/31/2017 0215   CREATININE 12.99 (H) 03/31/2017 0215   CREATININE 1.40 (H) 07/21/2011 1429   CALCIUM 8.1 (L) 03/31/2017 0215   CALCIUM 7.9 (L) 03/31/2017 0215   GFRNONAA 3 (L) 03/31/2017 0215   GFRNONAA 3 (L) 03/31/2017 0215   GFRAA 3 (L) 03/31/2017 0215   GFRAA 3 (L) 03/31/2017 0215   CBC    Component Value Date/Time   WBC 9.9 03/31/2017 0215   RBC 2.55 (L) 03/31/2017 0215   HGB 7.6 (L) 03/31/2017 0215   HCT 23.5 (L) 03/31/2017 0215   PLT 262 03/31/2017 0215   MCV 92.2 03/31/2017 0215   MCH 29.8 03/31/2017 0215   MCHC 32.3 03/31/2017 0215   RDW 15.7 (H) 03/31/2017 0215   LYMPHSABS 1.6 25-Apr-2017 1937   MONOABS 0.9 04-25-2017 1937   EOSABS 0.6 25-Apr-2017 1937   BASOSABS 0.0 Apr 25, 2017 1937    Assessment/Plan: 1.  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  I had a lengthy discussion with the patient regarding the chronicity and severity of her kidney disease.  She was reluctant to consider dialysis, however she now understands the seriousness of her situation.  Will need to consult IR for tunneled HD catheter in the morning and initiate HD.  Will also continue to educate about dialysis and home therapies.  I again stressed the importance of BP control, diabetes control, and avoidance of NSAIDs to help slow the progression of CKD.  She had advanced CKD when seen by Dr. Darrick Penna 3 years ago so I don't believe this is reversible (renal US with increased echogenicity consistent with progressive disease). 1. Plan for [facility] today by IR and initiate HD 2. Will consult [facility] to evaluate  for AVF/AVG 3. Will start the CLIP process for outpatient dialysis, although this may be difficult since she lives in [place] and [place] (travelling between the 2)  2. Malignant HTN- resumed home meds except for the ARB and HCTZ with marked improvement.  Started on IV lasix to help with volume and temporize until we can initiate HD. 3. Volume overload- as above will start IV lasix and follow but will likely need to initiate HD today after HD cath placed. 4. Hyperkalemia- due to #1.  Stop ARB and NSAIDs, started IV lasix and given some IV bicarb x 1 amp and follow. Plan for HD today. 5. Anemia of CKD stage 5- will check iron stores but will also start ESA once started on HD 6. Secondary HPTH- will check ca/phos, iPTH and start binders/vitamin D as indicated 7. Medical noncompliance- stressed the importance of compliance with meds and medical follow up 8. DM- per primary 9. Vascular access- will need TDC and AVF/AVG placed during this hospitalization.  Irena Cords, MD [employer] [phone]

## 2017-03-31 NOTE — Progress Notes (Signed)
Pt easily agitated. Pt will not keep arm straight for IV Lasix to run and keeps getting agitated at this nurse for asking her to straighten it. This nurse put in an order for another IV site d/t pt not getting medication through current site.  Larey Dayshristy M Margrett Kalb, RN

## 2017-03-31 NOTE — Progress Notes (Signed)
Messaged MD regarding BP being 82/60 P 59. Awaiting response.   Teresa Dalton

## 2017-03-31 NOTE — Progress Notes (Addendum)
PROGRESS NOTE    Teresa Dalton   ZOX:[id number]  DOB: [birth year]  DOA: 03/31/2017 PCP: Patient, No Pcp Per   Brief Narrative:  Teresa Kayser. is a 51 y.o. female withHTN, DM2  medical history significant of CKD stage 4 back in 2015 who hasn't seen nephrology since. Patient presents to the ED with pain in feet and leg due to  worsening peripheral edema over the past few weeks.     Subjective: Sleepy after receiving dialysis cath. No complaints.     Assessment & Plan:   Principal Problem:   Acute on chronic kidney failure/ ESRD Mild hyperkalemia Metabolic acidosis - received dialysis cath today and will be dialyzed - vascular consult for fistula/graft - CLIP process  Active Problems: Severe pedal edema/ pain - due to above- venous duplex negative  Essential HTN - due to fluid overloaded - cont Norvasc, Clonidine, lasix (until dialysis starts), Lopressor - holding HCTZ, Losartan and Labetalol    DM2 (diabetes mellitus, type 2)  - holding Amaryl- cont Lantus and SSI  Obesity Body mass index is 33.8 kg/m.  DVT prophylaxis: Heparin Code Status: Full code Family Communication:  Disposition Plan: follow in hospital  Consultants:   Nephrology  Vascular surgery  IR Procedures:  Bilateral lower extremity venous duplex:  Preliminary findings: No evidence of deep vein thrombosis in the visualized veins of the lower extremities or baker's cysts bilaterally.  Very difficult exam due to patient body habitus, positioning and severe swelling.  Antimicrobials:  Anti-infectives    Start     Dose/Rate Route Frequency Ordered Stop   03/31/17 1100  ceFAZolin (ANCEF) IVPB 2g/100 mL premix     2 g 200 mL/hr over 30 Minutes Intravenous  Once 03/31/17 1045 03/31/17 1113   03/31/17 1037  ceFAZolin (ANCEF) 2-4 GM/100ML-% IVPB    Comments:  Ofori, Evelyn   : cabinet override      03/31/17 1037 03/31/17 2244     Objective: Vitals:   03/31/17 1045 03/31/17 1050  03/31/17 1055 03/31/17 1105  BP: (!) 157/80 (!) 153/84 (!) 184/81 (!) 157/91  Pulse: 68 73 73 74  Resp: 17 15 16 19   Temp:      TempSrc:      SpO2: 99% 100% 100% 100%  Weight:      Height:        Intake/Output Summary (Last 24 hours) at 03/31/17 1356 Last data filed at 03/31/17 1028  Gross per 24 hour  Intake              110 ml  Output              300 ml  Net             -190 ml   Filed Weights   03/22/2017 1111 03/31/17 0129  Weight: 90.7 kg (200 lb) 92.1 kg (203 lb 1.6 oz)    Examination: General exam: Appears comfortable - somnolent  HEENT: PERRLA, oral mucosa moist, no sclera icterus or thrush Respiratory system: Clear to auscultation. Respiratory effort normal. Cardiovascular system: S1 & S2 heard, RRR.  No murmurs  Gastrointestinal system: Abdomen soft, non-tender, nondistended. Normal bowel sound. No organomegaly Central nervous system: Alert and oriented. No focal neurological deficits. Extremities: No cyanosis, clubbing - 3 + pitting edema Skin: No rashes or ulcers Psychiatry: somnolent    Data Reviewed: I have personally reviewed following labs and imaging studies  CBC:  Recent Labs Lab 03/20/2017 1937 03/31/17 0215  WBC 12.2* 9.9  NEUTROABS 9.1*  --   HGB 8.2* 7.6*  HCT 26.3* 23.5*  MCV 92.9 92.2  PLT 281 262   Basic Metabolic Panel:  Recent Labs Lab 03/18/2017 1937 03/31/17 0215  NA 136 137  137  K 6.1* 5.7*  5.7*  CL 110 110  109  CO2 13* 15*  16*  GLUCOSE 166* 147*  147*  BUN 89* 89*  90*  CREATININE 13.26* 13.13*  12.99*  CALCIUM 8.1* 8.1*  7.9*  PHOS  --  8.8*   GFR: Estimated Creatinine Clearance: 5.9 mL/min (A) (by C-G formula based on SCr of 13.13 mg/dL (H)). Liver Function Tests:  Recent Labs Lab 04/02/2017 1937 03/31/17 0215  AST 11*  --   ALT 11*  --   ALKPHOS 118  --   BILITOT 0.8  --   PROT 6.8  --   ALBUMIN 2.8* 2.5*   No results for input(s): LIPASE, AMYLASE in the last 168 hours. No results for input(s):  AMMONIA in the last 168 hours. Coagulation Profile: No results for input(s): INR, PROTIME in the last 168 hours. Cardiac Enzymes: No results for input(s): CKTOTAL, CKMB, CKMBINDEX, TROPONINI in the last 168 hours. BNP (last 3 results) No results for input(s): PROBNP in the last 8760 hours. HbA1C:  Recent Labs  03/31/17 0215  HGBA1C 7.8*   CBG:  Recent Labs Lab 03/31/17 0131 03/31/17 0845 03/31/17 1205  GLUCAP 151* 84 75   Lipid Profile: No results for input(s): CHOL, HDL, LDLCALC, TRIG, CHOLHDL, LDLDIRECT in the last 72 hours. Thyroid Function Tests: No results for input(s): TSH, T4TOTAL, FREET4, T3FREE, THYROIDAB in the last 72 hours. Anemia Panel:  Recent Labs  04/09/2017 2357  FERRITIN 73  TIBC 259  IRON 22*   Urine analysis:    Component Value Date/Time   COLORURINE YELLOW 03/25/2017 2341   APPEARANCEUR CLEAR 04/06/2017 2341   LABSPEC 1.014 03/27/2017 2341   PHURINE 6.0 03/28/2017 2341   GLUCOSEU 150 (A) 03/16/2017 2341   HGBUR NEGATIVE 04/10/2017 2341   BILIRUBINUR NEGATIVE 03/28/2017 2341   KETONESUR NEGATIVE 03/24/2017 2341   PROTEINUR 100 (A) 03/31/2017 2341   UROBILINOGEN 0.2 05/30/2010 1628   NITRITE NEGATIVE 03/17/2017 2341   LEUKOCYTESUR TRACE (A) 03/29/2017 2341   Sepsis Labs: @LABRCNTIP (procalcitonin:4,lacticidven:4) )No results found for this or any previous visit (from the past 240 hour(s)).       Radiology Studies: Dg Chest 2 View  Result Date: 03/24/2017 CLINICAL DATA:  Bilateral leg swelling and shooting pain. EXAM: CHEST  2 VIEW COMPARISON:  None. FINDINGS: Markedly limited study due to body habitus. No pneumothorax. Cardiomegaly. The hila and mediastinum are unremarkable. Haziness over the lung bases is suspected to be due to overlapping soft tissues. No overt edema. IMPRESSION: Markedly limited study due to body habitus. Haziness over the bases is favored to be due to overlapping soft tissues. Cardiomegaly. No overt edema.  Electronically Signed   By: Gerome Sam III M.D   On: 03/23/2017 18:06   US Renal  Result Date: 03/31/2017 CLINICAL DATA:  Kidney failure. EXAM: RENAL / URINARY TRACT ULTRASOUND COMPLETE COMPARISON:  05/04/2014 FINDINGS: Right Kidney: Length: 11.5 cm. There is diffusely increased parenchymal echotexture of the right kidney suggesting chronic medical renal disease. Multiple cysts are demonstrated, including a 2.4 cm max diameter cyst in the upper pole and a 1.7 cm max diameter cyst in the lower pole. No hydronephrosis or solid mass lesions identified. Left Kidney: Length: 10.4 cm. Limited visualization of the left kidney due  to body habitus. No hydronephrosis is suggested. Detail is otherwise limited. Bladder: Bladder is decompressed and is not evaluated. IMPRESSION: Diffusely increased parenchymal echotexture in the right kidney suggesting medical renal disease. No hydronephrosis. Right renal cysts. Limited visualization of the left kidney but no hydronephrosis is seen. Electronically Signed   By: Burman Nieves M.D.   On: 03/31/2017 00:49   Ir Fluoro Guide Cv Line Right  Result Date: 03/31/2017 INDICATION: END-STAGE RENAL DISEASE, NO CURRENT ACCESS EXAM: ULTRASOUND GUIDANCE FOR VASCULAR ACCESS RIGHT INTERNAL JUGULAR PERMANENT HEMODIALYSIS CATHETER Date:  10/20/201810/20/2018 11:03 am Radiologist:  M. Ruel Favors, MD Guidance:  Ultrasound and fluoroscopic FLUOROSCOPY TIME:  Fluoroscopy Time: 1 minutes 0 seconds (14.6 mGy). MEDICATIONS: Ancef 2 g administered within 1 hour of the procedure ANESTHESIA/SEDATION: Versed 1.0 mg IV; Fentanyl 25 mcg IV; Moderate Sedation Time:  20 minutes The patient was continuously monitored during the procedure by the interventional radiology nurse under my direct supervision. CONTRAST:  None. COMPLICATIONS: None immediate. PROCEDURE: Informed consent was obtained from the patient following explanation of the procedure, risks, benefits and alternatives. The patient  understands, agrees and consents for the procedure. All questions were addressed. A time out was performed. Maximal barrier sterile technique utilized including caps, mask, sterile gowns, sterile gloves, large sterile drape, hand hygiene, and 2% chlorhexidine scrub. Under sterile conditions and local anesthesia, right internal jugular micropuncture venous access was performed with ultrasound. Images were obtained for documentation. A guide wire was inserted followed by a transitional dilator. Next, a 0.035 guidewire was advanced into the IVC with a 5-French catheter. Measurements were obtained from the right venotomy site to the proximal right atrium. In the right infraclavicular chest, a subcutaneous tunnel was created under sterile conditions and local anesthesia. 1% lidocaine with epinephrine was utilized for this. The 23 cm tip to cuff dialysis catheter was tunneled subcutaneously to the venotomy site and inserted into the SVC/RA junction through a valved peel-away sheath. Position was confirmed with fluoroscopy. Images were obtained for documentation. Blood was aspirated from the catheter followed by saline and heparin flushes. The appropriate volume and strength of heparin was instilled in each lumen. Caps were applied. The catheter was secured at the tunnel site with Gelfoam and a pursestring suture. The venotomy site was closed with subcuticular Vicryl suture. Dermabond was applied to the small right neck incision. A dry sterile dressing was applied. The catheter is ready for use. No immediate complications. IMPRESSION: Ultrasound and fluoroscopically guided right internal jugular tunneled hemodialysis catheter (23 cm tip to cuff dialysis palindrome catheter). Electronically Signed   By: Judie Petit.  Shick M.D.   On: 03/31/2017 11:07   Ir US Guide Vasc Access Right  Result Date: 03/31/2017 INDICATION: END-STAGE RENAL DISEASE, NO CURRENT ACCESS EXAM: ULTRASOUND GUIDANCE FOR VASCULAR ACCESS RIGHT INTERNAL JUGULAR  PERMANENT HEMODIALYSIS CATHETER Date:  10/20/201810/20/2018 11:03 am Radiologist:  M. Ruel Favors, MD Guidance:  Ultrasound and fluoroscopic FLUOROSCOPY TIME:  Fluoroscopy Time: 1 minutes 0 seconds (14.6 mGy). MEDICATIONS: Ancef 2 g administered within 1 hour of the procedure ANESTHESIA/SEDATION: Versed 1.0 mg IV; Fentanyl 25 mcg IV; Moderate Sedation Time:  20 minutes The patient was continuously monitored during the procedure by the interventional radiology nurse under my direct supervision. CONTRAST:  None. COMPLICATIONS: None immediate. PROCEDURE: Informed consent was obtained from the patient following explanation of the procedure, risks, benefits and alternatives. The patient understands, agrees and consents for the procedure. All questions were addressed. A time out was performed. Maximal barrier sterile technique utilized including caps,  mask, sterile gowns, sterile gloves, large sterile drape, hand hygiene, and 2% chlorhexidine scrub. Under sterile conditions and local anesthesia, right internal jugular micropuncture venous access was performed with ultrasound. Images were obtained for documentation. A guide wire was inserted followed by a transitional dilator. Next, a 0.035 guidewire was advanced into the IVC with a 5-French catheter. Measurements were obtained from the right venotomy site to the proximal right atrium. In the right infraclavicular chest, a subcutaneous tunnel was created under sterile conditions and local anesthesia. 1% lidocaine with epinephrine was utilized for this. The 23 cm tip to cuff dialysis catheter was tunneled subcutaneously to the venotomy site and inserted into the SVC/RA junction through a valved peel-away sheath. Position was confirmed with fluoroscopy. Images were obtained for documentation. Blood was aspirated from the catheter followed by saline and heparin flushes. The appropriate volume and strength of heparin was instilled in each lumen. Caps were applied. The  catheter was secured at the tunnel site with Gelfoam and a pursestring suture. The venotomy site was closed with subcuticular Vicryl suture. Dermabond was applied to the small right neck incision. A dry sterile dressing was applied. The catheter is ready for use. No immediate complications. IMPRESSION: Ultrasound and fluoroscopically guided right internal jugular tunneled hemodialysis catheter (23 cm tip to cuff dialysis palindrome catheter). Electronically Signed   By: Judie PetitM.  Shick M.D.   On: 03/31/2017 11:07      Scheduled Meds: . amLODipine  10 mg Oral Daily  . cloNIDine  0.3 mg Oral BID  . fentaNYL      . gabapentin  300 mg Oral TID  . gelatin adsorbable      . insulin aspart  0-15 Units Subcutaneous TID WC  . insulin detemir  45 Units Subcutaneous BID  . lidocaine      . metoprolol tartrate  50 mg Oral BID  . midazolam      . simvastatin  20 mg Oral Daily  . sodium bicarbonate  1,300 mg Oral BID   Continuous Infusions: . ceFAZolin    . furosemide 120 mg (03/31/17 1214)     LOS: 1 day    Time spent in minutes: 35    Calvert CantorSaima Matylda Fehring, MD [facility] Pager: [email or web address] Password [email or web address] 03/31/2017, 1:56 PM

## 2017-03-31 NOTE — Progress Notes (Signed)
Critical lab value received : Potassium of 7.2  Nephrology notified and tx adjusted accordingly.  Will continue to monitor.

## 2017-04-01 ENCOUNTER — Encounter (HOSPITAL_COMMUNITY): Payer: Self-pay

## 2017-04-01 DIAGNOSIS — R600 Localized edema: Secondary | ICD-10-CM

## 2017-04-01 LAB — BASIC METABOLIC PANEL
ANION GAP: 13 (ref 5–15)
BUN: 68 mg/dL — ABNORMAL HIGH (ref 6–20)
CALCIUM: 8.1 mg/dL — AB (ref 8.9–10.3)
CHLORIDE: 104 mmol/L (ref 101–111)
CO2: 18 mmol/L — AB (ref 22–32)
Creatinine, Ser: 10.73 mg/dL — ABNORMAL HIGH (ref 0.44–1.00)
GFR calc Af Amer: 4 mL/min — ABNORMAL LOW (ref >60)
GFR calc non Af Amer: 4 mL/min — ABNORMAL LOW (ref >60)
GLUCOSE: 79 mg/dL (ref 65–99)
Potassium: 5.1 mmol/L (ref 3.5–5.1)
Sodium: 135 mmol/L (ref 135–145)

## 2017-04-01 LAB — CBC WITH DIFFERENTIAL/PLATELET
BASOS ABS: 0 10*3/uL (ref 0.0–0.1)
Basophils Relative: 0 %
Eosinophils Absolute: 0.5 10*3/uL (ref 0.0–0.7)
Eosinophils Relative: 4 %
HEMATOCRIT: 24.6 % — AB (ref 36.0–46.0)
Hemoglobin: 7.9 g/dL — ABNORMAL LOW (ref 12.0–15.0)
LYMPHS PCT: 12 %
Lymphs Abs: 1.4 10*3/uL (ref 0.7–4.0)
MCH: 29.6 pg (ref 26.0–34.0)
MCHC: 32.1 g/dL (ref 30.0–36.0)
MCV: 92.1 fL (ref 78.0–100.0)
MONO ABS: 0.7 10*3/uL (ref 0.1–1.0)
MONOS PCT: 6 %
NEUTROS ABS: 9 10*3/uL — AB (ref 1.7–7.7)
Neutrophils Relative %: 78 %
Platelets: 267 10*3/uL (ref 150–400)
RBC: 2.67 MIL/uL — ABNORMAL LOW (ref 3.87–5.11)
RDW: 15.7 % — AB (ref 11.5–15.5)
WBC: 11.6 10*3/uL — ABNORMAL HIGH (ref 4.0–10.5)

## 2017-04-01 LAB — MRSA PCR SCREENING: MRSA by PCR: NEGATIVE

## 2017-04-01 LAB — HEPATITIS B SURFACE ANTIBODY,QUALITATIVE: Hep B S Ab: REACTIVE

## 2017-04-01 LAB — HEPATITIS B CORE ANTIBODY, TOTAL: Hep B Core Total Ab: NEGATIVE

## 2017-04-01 LAB — HEPATITIS B SURFACE ANTIGEN: Hepatitis B Surface Ag: NEGATIVE

## 2017-04-01 LAB — GLUCOSE, CAPILLARY
GLUCOSE-CAPILLARY: 103 mg/dL — AB (ref 65–99)
Glucose-Capillary: 92 mg/dL (ref 65–99)
Glucose-Capillary: 87 mg/dL (ref 65–99)
Glucose-Capillary: 141 mg/dL — ABNORMAL HIGH (ref 65–99)

## 2017-04-01 MED ORDER — DARBEPOETIN ALFA 40 MCG/0.4ML IJ SOSY: 40.0000 ug | PREFILLED_SYRINGE | INTRAMUSCULAR | Status: DC

## 2017-04-01 MED ORDER — LABETALOL HCL 200 MG PO TABS
200.0000 mg | ORAL_TABLET | Freq: Two times a day (BID) | ORAL | Status: DC
  Administered 2017-04-01 – 2017-04-15 (×18): 200 mg via ORAL
  Filled 2017-04-01 (×20): qty 1

## 2017-04-01 MED ORDER — LANTHANUM CARBONATE 500 MG PO CHEW
1000.0000 mg | CHEWABLE_TABLET | Freq: Three times a day (TID) | ORAL | Status: DC
Start: 2017-04-01 — End: 2017-04-17
  Administered 2017-04-01 – 2017-04-17 (×19): 1000 mg via ORAL
  Filled 2017-04-01 (×30): qty 2

## 2017-04-01 MED ORDER — INSULIN DETEMIR 100 UNIT/ML ~~LOC~~ SOLN
40.0000 [IU] | Freq: Two times a day (BID) | SUBCUTANEOUS | Status: DC
  Administered 2017-04-01: 40 [IU] via SUBCUTANEOUS
  Filled 2017-04-01 (×2): qty 0.4

## 2017-04-01 NOTE — Consult Note (Addendum)
I will discuss surgical intervention for dialysis access once vein mapping has been completed.  The order has been placed  [facility]

## 2017-04-01 NOTE — Progress Notes (Signed)
Night Shift RN stated that MD stated that they would D/C the lasix since the patient is on HD. Lasix is still ordered. MD notified. Order D/C'd Will continue to monitor

## 2017-04-01 NOTE — Progress Notes (Signed)
Patients son is requesting a note for work. MD notified.

## 2017-04-01 NOTE — Progress Notes (Signed)
Patient ID: Teresa Dalton., female   DOB: [birth year], 51 y.o.   MRN: [id number] S:became confused last night and pulled out IV's and tugged on HD catheter which is still in place O:BP (!) 184/77   Pulse 78   Temp 98 F (36.7 C) (Oral)   Resp 20   Ht 5\' 5"  (1.651 m)   Wt 89.5 kg (197 lb 5 oz)   LMP 03/26/2017   SpO2 98%   BMI 32.83 kg/m   Intake/Output Summary (Last 24 hours) at 04/01/17 1135 Last data filed at 04/01/17 1000  Gross per 24 hour  Intake             1080 ml  Output             2750 ml  Net            -1670 ml   Intake/Output: I/O last 3 completed shifts: In: 950 [P.O.:840; IV Piggyback:110] Out: 3050 [Urine:550; Other:2500]  Intake/Output this shift:  Total I/O In: 480 [P.O.:480] Out: 0  Weight change: 1.381 kg (3 lb 0.7 oz) WUJ:WJXBJ AAF in NAD CVS: no rub Resp:occ rhonchi and bibasilar crackles Abd: obese Ext:2+ tense edema with some blistering on right calf, tender to palpation   Recent Labs Lab 2017/04/14 1937 03/31/17 0215 03/31/17 1758 04/01/17 0046  NA 136 137  137 137 135  K 6.1* 5.7*  5.7* 7.2* 5.1  CL 110 110  109 108 104  CO2 13* 15*  16* 16* 18*  GLUCOSE 166* 147*  147* 109* 79  BUN 89* 89*  90* 92* 68*  CREATININE 13.26* 13.13*  12.99* 13.13* 10.73*  ALBUMIN 2.8* 2.5* 2.6*  --   CALCIUM 8.1* 8.1*  7.9* 7.9* 8.1*  PHOS  --  8.8* 8.6*  --   AST 11*  --   --   --   ALT 11*  --   --   --    Liver Function Tests:  Recent Labs Lab 04/14/2017 1937 03/31/17 0215 03/31/17 1758  AST 11*  --   --   ALT 11*  --   --   ALKPHOS 118  --   --   BILITOT 0.8  --   --   PROT 6.8  --   --   ALBUMIN 2.8* 2.5* 2.6*   No results for input(s): LIPASE, AMYLASE in the last 168 hours. No results for input(s): AMMONIA in the last 168 hours. CBC:  Recent Labs Lab 2017/04/14 1937 03/31/17 0215 03/31/17 1801 04/01/17 0046  WBC 12.2* 9.9 12.2* 11.6*  NEUTROABS 9.1*  --   --  9.0*  HGB 8.2* 7.6* 7.6* 7.9*  HCT 26.3* 23.5* 24.3* 24.6*   MCV 92.9 92.2 92.7 92.1  PLT 281 262 301 267   Cardiac Enzymes: No results for input(s): CKTOTAL, CKMB, CKMBINDEX, TROPONINI in the last 168 hours. CBG:  Recent Labs Lab 03/31/17 0845 03/31/17 1205 03/31/17 2058 03/31/17 2152 04/01/17 0730  GLUCAP 84 75 62* 103* 92    Iron Studies:  Recent Labs  04-14-2017 2357  IRON 22*  TIBC 259  FERRITIN 73   Studies/Results: Dg Chest 2 View  Result Date: 04-14-17 CLINICAL DATA:  Bilateral leg swelling and shooting pain. EXAM: CHEST  2 VIEW COMPARISON:  None. FINDINGS: Markedly limited study due to body habitus. No pneumothorax. Cardiomegaly. The hila and mediastinum are unremarkable. Haziness over the lung bases is suspected to be due to overlapping soft tissues. No overt edema. IMPRESSION: Markedly limited study  due to body habitus. Haziness over the bases is favored to be due to overlapping soft tissues. Cardiomegaly. No overt edema. Electronically Signed   By: Gerome Sam III M.D   On: 03/19/2017 18:06   US Renal  Result Date: 03/31/2017 CLINICAL DATA:  Kidney failure. EXAM: RENAL / URINARY TRACT ULTRASOUND COMPLETE COMPARISON:  05/04/2014 FINDINGS: Right Kidney: Length: 11.5 cm. There is diffusely increased parenchymal echotexture of the right kidney suggesting chronic medical renal disease. Multiple cysts are demonstrated, including a 2.4 cm max diameter cyst in the upper pole and a 1.7 cm max diameter cyst in the lower pole. No hydronephrosis or solid mass lesions identified. Left Kidney: Length: 10.4 cm. Limited visualization of the left kidney due to body habitus. No hydronephrosis is suggested. Detail is otherwise limited. Bladder: Bladder is decompressed and is not evaluated. IMPRESSION: Diffusely increased parenchymal echotexture in the right kidney suggesting medical renal disease. No hydronephrosis. Right renal cysts. Limited visualization of the left kidney but no hydronephrosis is seen. Electronically Signed   By: Burman Nieves M.D.   On: 03/31/2017 00:49   Ir Fluoro Guide Cv Line Right  Result Date: 03/31/2017 INDICATION: END-STAGE RENAL DISEASE, NO CURRENT ACCESS EXAM: ULTRASOUND GUIDANCE FOR VASCULAR ACCESS RIGHT INTERNAL JUGULAR PERMANENT HEMODIALYSIS CATHETER Date:  10/20/201810/20/2018 11:03 am Radiologist:  M. Ruel Favors, MD Guidance:  Ultrasound and fluoroscopic FLUOROSCOPY TIME:  Fluoroscopy Time: 1 minutes 0 seconds (14.6 mGy). MEDICATIONS: Ancef 2 g administered within 1 hour of the procedure ANESTHESIA/SEDATION: Versed 1.0 mg IV; Fentanyl 25 mcg IV; Moderate Sedation Time:  20 minutes The patient was continuously monitored during the procedure by the interventional radiology nurse under my direct supervision. CONTRAST:  None. COMPLICATIONS: None immediate. PROCEDURE: Informed consent was obtained from the patient following explanation of the procedure, risks, benefits and alternatives. The patient understands, agrees and consents for the procedure. All questions were addressed. A time out was performed. Maximal barrier sterile technique utilized including caps, mask, sterile gowns, sterile gloves, large sterile drape, hand hygiene, and 2% chlorhexidine scrub. Under sterile conditions and local anesthesia, right internal jugular micropuncture venous access was performed with ultrasound. Images were obtained for documentation. A guide wire was inserted followed by a transitional dilator. Next, a 0.035 guidewire was advanced into the IVC with a 5-French catheter. Measurements were obtained from the right venotomy site to the proximal right atrium. In the right infraclavicular chest, a subcutaneous tunnel was created under sterile conditions and local anesthesia. 1% lidocaine with epinephrine was utilized for this. The 23 cm tip to cuff dialysis catheter was tunneled subcutaneously to the venotomy site and inserted into the SVC/RA junction through a valved peel-away sheath. Position was confirmed with fluoroscopy.  Images were obtained for documentation. Blood was aspirated from the catheter followed by saline and heparin flushes. The appropriate volume and strength of heparin was instilled in each lumen. Caps were applied. The catheter was secured at the tunnel site with Gelfoam and a pursestring suture. The venotomy site was closed with subcuticular Vicryl suture. Dermabond was applied to the small right neck incision. A dry sterile dressing was applied. The catheter is ready for use. No immediate complications. IMPRESSION: Ultrasound and fluoroscopically guided right internal jugular tunneled hemodialysis catheter (23 cm tip to cuff dialysis palindrome catheter). Electronically Signed   By: Judie Petit.  Shick M.D.   On: 03/31/2017 11:07   Ir US Guide Vasc Access Right  Result Date: 03/31/2017 INDICATION: END-STAGE RENAL DISEASE, NO CURRENT ACCESS EXAM: ULTRASOUND GUIDANCE FOR VASCULAR  ACCESS RIGHT INTERNAL JUGULAR PERMANENT HEMODIALYSIS CATHETER Date:  10/20/201810/20/2018 11:03 am Radiologist:  M. Ruel Favors, MD Guidance:  Ultrasound and fluoroscopic FLUOROSCOPY TIME:  Fluoroscopy Time: 1 minutes 0 seconds (14.6 mGy). MEDICATIONS: Ancef 2 g administered within 1 hour of the procedure ANESTHESIA/SEDATION: Versed 1.0 mg IV; Fentanyl 25 mcg IV; Moderate Sedation Time:  20 minutes The patient was continuously monitored during the procedure by the interventional radiology nurse under my direct supervision. CONTRAST:  None. COMPLICATIONS: None immediate. PROCEDURE: Informed consent was obtained from the patient following explanation of the procedure, risks, benefits and alternatives. The patient understands, agrees and consents for the procedure. All questions were addressed. A time out was performed. Maximal barrier sterile technique utilized including caps, mask, sterile gowns, sterile gloves, large sterile drape, hand hygiene, and 2% chlorhexidine scrub. Under sterile conditions and local anesthesia, right internal jugular  micropuncture venous access was performed with ultrasound. Images were obtained for documentation. A guide wire was inserted followed by a transitional dilator. Next, a 0.035 guidewire was advanced into the IVC with a 5-French catheter. Measurements were obtained from the right venotomy site to the proximal right atrium. In the right infraclavicular chest, a subcutaneous tunnel was created under sterile conditions and local anesthesia. 1% lidocaine with epinephrine was utilized for this. The 23 cm tip to cuff dialysis catheter was tunneled subcutaneously to the venotomy site and inserted into the SVC/RA junction through a valved peel-away sheath. Position was confirmed with fluoroscopy. Images were obtained for documentation. Blood was aspirated from the catheter followed by saline and heparin flushes. The appropriate volume and strength of heparin was instilled in each lumen. Caps were applied. The catheter was secured at the tunnel site with Gelfoam and a pursestring suture. The venotomy site was closed with subcuticular Vicryl suture. Dermabond was applied to the small right neck incision. A dry sterile dressing was applied. The catheter is ready for use. No immediate complications. IMPRESSION: Ultrasound and fluoroscopically guided right internal jugular tunneled hemodialysis catheter (23 cm tip to cuff dialysis palindrome catheter). Electronically Signed   By: Judie Petit.  Shick M.D.   On: 03/31/2017 11:07   . amLODipine  10 mg Oral Daily  . cloNIDine  0.3 mg Oral BID  . gabapentin  300 mg Oral TID  . heparin subcutaneous  5,000 Units Subcutaneous Q8H  . insulin aspart  0-15 Units Subcutaneous TID WC  . insulin detemir  45 Units Subcutaneous BID  . metoprolol tartrate  50 mg Oral BID  . simvastatin  20 mg Oral Daily  . sodium bicarbonate  1,300 mg Oral BID    BMET    Component Value Date/Time   NA 135 04/01/2017 0046   K 5.1 04/01/2017 0046   CL 104 04/01/2017 0046   CO2 18 (L) 04/01/2017 0046    GLUCOSE 79 04/01/2017 0046   BUN 68 (H) 04/01/2017 0046   CREATININE 10.73 (H) 04/01/2017 0046   CREATININE 1.40 (H) 07/21/2011 1429   CALCIUM 8.1 (L) 04/01/2017 0046   GFRNONAA 4 (L) 04/01/2017 0046   GFRAA 4 (L) 04/01/2017 0046   CBC    Component Value Date/Time   WBC 11.6 (H) 04/01/2017 0046   RBC 2.67 (L) 04/01/2017 0046   HGB 7.9 (L) 04/01/2017 0046   HCT 24.6 (L) 04/01/2017 0046   PLT 267 04/01/2017 0046   MCV 92.1 04/01/2017 0046   MCH 29.6 04/01/2017 0046   MCHC 32.1 04/01/2017 0046   RDW 15.7 (H) 04/01/2017 0046   LYMPHSABS 1.4 04/01/2017  0046   MONOABS 0.7 04/01/2017 0046   EOSABS 0.5 04/01/2017 0046   BASOSABS 0.0 04/01/2017 0046     Assessment/Plan: 1. 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 I had a lengthy discussion with the patient regarding the chronicity and severity of her kidney disease. She was reluctant to consider dialysis, however she now understands the seriousness of her situation. Will need to consult IR for tunneled HD catheter in the morning and initiate HD. Will also continue to educate about dialysis and home therapies. I again stressed the importance of BP control, diabetes control, and avoidance of NSAIDs to help slow the progression of CKD. She had advanced CKD when seen by Dr. Darrick Pennaeterding 3 years ago so I don't believe this is reversible (renal US with increased echogenicity consistent with progressive disease). 1. S/p RIJ TDC 03/31/17 by IR and initiated HD afterwards 2. Will consult [facility] to evaluate for AVF/AVG 3. Will start the CLIP process for outpatient dialysis, although this may be difficult since [place] in [place] ([place] between [place]) 4. Plan for second HD session tomorrow 2. Malignant HTN- resumed home meds except for the ARB and HCTZ with marked improvement.  3. Volume overload- UF with HD and challenge  edw. 4. Hyperkalemia- due to #1. Stop ARB and NSAIDs, responded to HD 5. Anemia of CKD stage 5- will check iron stores but will also start ESA once started on HD 6. Secondary HPTH- will check ca/phos, iPTH and start binders/vitamin D as indicated 7. Medical noncompliance- stressed the importance of compliance with meds and medical follow up 8. DM- per primary 9. Vascular access- will need AVF/AVG placed during this hospitalization.  Irena CordsJoseph A. Cleda Imel, MD [facility] [phone]

## 2017-04-01 NOTE — Progress Notes (Signed)
Care instructions listed for NPO on work list was put into the wrong pt's chart by MD, they were made aware and other orders were fixed.   Larey Dayshristy M Andrius Andrepont, RN

## 2017-04-01 NOTE — Progress Notes (Signed)
Spoke with MD regarding meds. This nurse was told to hold BP meds and Lasix d/t BP being low after dialysis and not to give Levemir because CBG was 62 and had to give amp of D50 to get it to 103. Pt is responding more to voice, but still very groggy. Son did feed pt some, but pt was still very sleepy when he was doing it.   Larey Dayshristy M Kaedence Connelly, RN

## 2017-04-01 NOTE — Progress Notes (Signed)
This nurse received phone call from Tele stating pt was off. This nurse went in to check leads and saw that pt had taken her gown off, her IV had came out, and she was pulling on her HD cath. This nurse put 2 4 by 4 gauze over site and notified IV team to come clean and redress it. Pt was given a bath and she seemed to wake up a lot more. Pt much more alert and apologizing for touching the site and for " being so much trouble, "she states. This nurse reassured pt that she was not trouble and that she was just going through a lot. Pt is now resting in bed and waiting on IV team. This nurse brought in mittens to put on pt, but pt refused to put them on stating, " I am awake now and I know not to touch it and what is going on." Will continue to monitor.   Larey Dayshristy M Valree Feild, RN

## 2017-04-01 NOTE — Progress Notes (Signed)
PROGRESS NOTE    Teresa Dalton   ZOX:[id number]  DOB: Oct 12, 1965  DOA: 03/31/2017 PCP: Patient, No Pcp Per   Brief Narrative:  Teresa Kayser. is a 51 y.o. female withHTN, DM2  medical history significant of CKD stage 4 back in 2015 who hasn't seen nephrology since. Patient presents to the ED with pain in feet and leg due to  worsening peripheral edema over the past few weeks.     Subjective: No complaints other than legs hurting.    Assessment & Plan:   Principal Problem:   Acute on chronic kidney failure/ ESRD Mild hyperkalemia Metabolic acidosis - 10/20- received dialysis cath & dialyzed- will be dialyzed again tomorrow - vascular consult for fistula/graft - CLIP process  Active Problems: Severe pedal edema/ pain - due to above- venous duplex negative - elevate- continue dialysis- PRN Hydrocodone for now until swelling improves - cont Neurontin at current dose  Essential HTN - due to fluid overloaded - cont Norvasc, Clonidine, Labetalol - holding HCTZ, Losartan      DM2 (diabetes mellitus, type 2)  - holding Amaryl- cont Lantus and SSI- hypoglycemic- cut back on Lantus  Anemia of chronic disease - transfuse per nephrology   Obesity Body mass index is 32.83 kg/m.  DVT prophylaxis: Heparin Code Status: Full code Family Communication:  Disposition Plan: follow in hospital  Consultants:   Nephrology  Vascular surgery  IR Procedures:  Bilateral lower extremity venous duplex:  Preliminary findings: No evidence of deep vein thrombosis in the visualized veins of the lower extremities or baker's cysts bilaterally.  Very difficult exam due to patient body habitus, positioning and severe swelling.  Antimicrobials:  Anti-infectives    Start     Dose/Rate Route Frequency Ordered Stop   03/31/17 1100  ceFAZolin (ANCEF) IVPB 2g/100 mL premix     2 g 200 mL/hr over 30 Minutes Intravenous  Once 03/31/17 1045 03/31/17 1113   03/31/17 1037  ceFAZolin  (ANCEF) 2-4 GM/100ML-% IVPB    Comments:  Ofori, Evelyn   : cabinet override      03/31/17 1037 03/31/17 2244     Objective: Vitals:   04/01/17 0500 04/01/17 0519 04/01/17 0922 04/01/17 1018  BP:  (!) 156/61 (!) 167/56 (!) 184/77  Pulse:  79 81 78  Resp:  19 20   Temp:  98.4 F (36.9 C) 98 F (36.7 C)   TempSrc:  Oral Oral   SpO2:  98% 98%   Weight: 89.5 kg (197 lb 5 oz)     Height:        Intake/Output Summary (Last 24 hours) at 04/01/17 1320 Last data filed at 04/01/17 1000  Gross per 24 hour  Intake              840 ml  Output             2500 ml  Net            -1660 ml   Filed Weights   03/31/17 2014 03/31/17 2107 04/01/17 0500  Weight: 89.6 kg (197 lb 8.5 oz) 89.5 kg (197 lb 5 oz) 89.5 kg (197 lb 5 oz)    Examination: General exam: Appears comfortable - somnolent  HEENT: PERRLA, oral mucosa moist, no sclera icterus or thrush Respiratory system: Clear to auscultation. Respiratory effort normal. Cardiovascular system: S1 & S2 heard, RRR.  No murmurs  Gastrointestinal system: Abdomen soft, non-tender, nondistended. Normal bowel sound. No organomegaly Central nervous system: Alert and oriented. No  focal neurological deficits. Extremities: No cyanosis, clubbing - 2 + pitting edema Skin: No rashes or ulcers Psychiatry: somnolent    Data Reviewed: I have personally reviewed following labs and imaging studies  CBC:  Recent Labs Lab 03/25/2017 1937 03/31/17 0215 03/31/17 1801 04/01/17 0046  WBC 12.2* 9.9 12.2* 11.6*  NEUTROABS 9.1*  --   --  9.0*  HGB 8.2* 7.6* 7.6* 7.9*  HCT 26.3* 23.5* 24.3* 24.6*  MCV 92.9 92.2 92.7 92.1  PLT 281 262 301 267   Basic Metabolic Panel:  Recent Labs Lab 03/24/2017 1937 03/31/17 0215 03/31/17 1758 04/01/17 0046  NA 136 137  137 137 135  K 6.1* 5.7*  5.7* 7.2* 5.1  CL 110 110  109 108 104  CO2 13* 15*  16* 16* 18*  GLUCOSE 166* 147*  147* 109* 79  BUN 89* 89*  90* 92* 68*  CREATININE 13.26* 13.13*  12.99*  13.13* 10.73*  CALCIUM 8.1* 8.1*  7.9* 7.9* 8.1*  PHOS  --  8.8* 8.6*  --    GFR: Estimated Creatinine Clearance: 7.1 mL/min (A) (by C-G formula based on SCr of 10.73 mg/dL (H)). Liver Function Tests:  Recent Labs Lab 03/13/2017 1937 03/31/17 0215 03/31/17 1758  AST 11*  --   --   ALT 11*  --   --   ALKPHOS 118  --   --   BILITOT 0.8  --   --   PROT 6.8  --   --   ALBUMIN 2.8* 2.5* 2.6*   No results for input(s): LIPASE, AMYLASE in the last 168 hours. No results for input(s): AMMONIA in the last 168 hours. Coagulation Profile: No results for input(s): INR, PROTIME in the last 168 hours. Cardiac Enzymes: No results for input(s): CKTOTAL, CKMB, CKMBINDEX, TROPONINI in the last 168 hours. BNP (last 3 results) No results for input(s): PROBNP in the last 8760 hours. HbA1C:  Recent Labs  03/31/17 0215  HGBA1C 7.8*   CBG:  Recent Labs Lab 03/31/17 1205 03/31/17 2058 03/31/17 2152 04/01/17 0730 04/01/17 1116  GLUCAP 75 62* 103* 92 103*   Lipid Profile: No results for input(s): CHOL, HDL, LDLCALC, TRIG, CHOLHDL, LDLDIRECT in the last 72 hours. Thyroid Function Tests: No results for input(s): TSH, T4TOTAL, FREET4, T3FREE, THYROIDAB in the last 72 hours. Anemia Panel:  Recent Labs  04/03/2017 2357  FERRITIN 73  TIBC 259  IRON 22*   Urine analysis:    Component Value Date/Time   COLORURINE YELLOW 03/12/2017 2341   APPEARANCEUR CLEAR 03/15/2017 2341   LABSPEC 1.014 03/19/2017 2341   PHURINE 6.0 03/17/2017 2341   GLUCOSEU 150 (A) 03/26/2017 2341   HGBUR NEGATIVE 04/04/2017 2341   BILIRUBINUR NEGATIVE 04/01/2017 2341   KETONESUR NEGATIVE 03/29/2017 2341   PROTEINUR 100 (A) 03/20/2017 2341   UROBILINOGEN 0.2 05/30/2010 1628   NITRITE NEGATIVE 03/25/2017 2341   LEUKOCYTESUR TRACE (A) 03/31/2017 2341   Sepsis Labs: @LABRCNTIP (procalcitonin:4,lacticidven:4) )No results found for this or any previous visit (from the past 240 hour(s)).       Radiology  Studies: Dg Chest 2 View  Result Date: 03/16/2017 CLINICAL DATA:  Bilateral leg swelling and shooting pain. EXAM: CHEST  2 VIEW COMPARISON:  None. FINDINGS: Markedly limited study due to body habitus. No pneumothorax. Cardiomegaly. The hila and mediastinum are unremarkable. Haziness over the lung bases is suspected to be due to overlapping soft tissues. No overt edema. IMPRESSION: Markedly limited study due to body habitus. Haziness over the bases is favored to  be due to overlapping soft tissues. Cardiomegaly. No overt edema. Electronically Signed   By: Gerome Sam III M.D   On: 03/26/2017 18:06   US Renal  Result Date: 03/31/2017 CLINICAL DATA:  Kidney failure. EXAM: RENAL / URINARY TRACT ULTRASOUND COMPLETE COMPARISON:  05/04/2014 FINDINGS: Right Kidney: Length: 11.5 cm. There is diffusely increased parenchymal echotexture of the right kidney suggesting chronic medical renal disease. Multiple cysts are demonstrated, including a 2.4 cm max diameter cyst in the upper pole and a 1.7 cm max diameter cyst in the lower pole. No hydronephrosis or solid mass lesions identified. Left Kidney: Length: 10.4 cm. Limited visualization of the left kidney due to body habitus. No hydronephrosis is suggested. Detail is otherwise limited. Bladder: Bladder is decompressed and is not evaluated. IMPRESSION: Diffusely increased parenchymal echotexture in the right kidney suggesting medical renal disease. No hydronephrosis. Right renal cysts. Limited visualization of the left kidney but no hydronephrosis is seen. Electronically Signed   By: Burman Nieves M.D.   On: 03/31/2017 00:49   Ir Fluoro Guide Cv Line Right  Result Date: 03/31/2017 INDICATION: END-STAGE RENAL DISEASE, NO CURRENT ACCESS EXAM: ULTRASOUND GUIDANCE FOR VASCULAR ACCESS RIGHT INTERNAL JUGULAR PERMANENT HEMODIALYSIS CATHETER Date:  10/20/201810/20/2018 11:03 am Radiologist:  M. Ruel Favors, MD Guidance:  Ultrasound and fluoroscopic FLUOROSCOPY TIME:   Fluoroscopy Time: 1 minutes 0 seconds (14.6 mGy). MEDICATIONS: Ancef 2 g administered within 1 hour of the procedure ANESTHESIA/SEDATION: Versed 1.0 mg IV; Fentanyl 25 mcg IV; Moderate Sedation Time:  20 minutes The patient was continuously monitored during the procedure by the interventional radiology nurse under my direct supervision. CONTRAST:  None. COMPLICATIONS: None immediate. PROCEDURE: Informed consent was obtained from the patient following explanation of the procedure, risks, benefits and alternatives. The patient understands, agrees and consents for the procedure. All questions were addressed. A time out was performed. Maximal barrier sterile technique utilized including caps, mask, sterile gowns, sterile gloves, large sterile drape, hand hygiene, and 2% chlorhexidine scrub. Under sterile conditions and local anesthesia, right internal jugular micropuncture venous access was performed with ultrasound. Images were obtained for documentation. A guide wire was inserted followed by a transitional dilator. Next, a 0.035 guidewire was advanced into the IVC with a 5-French catheter. Measurements were obtained from the right venotomy site to the proximal right atrium. In the right infraclavicular chest, a subcutaneous tunnel was created under sterile conditions and local anesthesia. 1% lidocaine with epinephrine was utilized for this. The 23 cm tip to cuff dialysis catheter was tunneled subcutaneously to the venotomy site and inserted into the SVC/RA junction through a valved peel-away sheath. Position was confirmed with fluoroscopy. Images were obtained for documentation. Blood was aspirated from the catheter followed by saline and heparin flushes. The appropriate volume and strength of heparin was instilled in each lumen. Caps were applied. The catheter was secured at the tunnel site with Gelfoam and a pursestring suture. The venotomy site was closed with subcuticular Vicryl suture. Dermabond was applied to  the small right neck incision. A dry sterile dressing was applied. The catheter is ready for use. No immediate complications. IMPRESSION: Ultrasound and fluoroscopically guided right internal jugular tunneled hemodialysis catheter (23 cm tip to cuff dialysis palindrome catheter). Electronically Signed   By: Judie Petit.  Shick M.D.   On: 03/31/2017 11:07   Ir US Guide Vasc Access Right  Result Date: 03/31/2017 INDICATION: END-STAGE RENAL DISEASE, NO CURRENT ACCESS EXAM: ULTRASOUND GUIDANCE FOR VASCULAR ACCESS RIGHT INTERNAL JUGULAR PERMANENT HEMODIALYSIS CATHETER Date:  10/20/201810/20/2018 11:03  am Radiologist:  M. Ruel Favors, MD Guidance:  Ultrasound and fluoroscopic FLUOROSCOPY TIME:  Fluoroscopy Time: 1 minutes 0 seconds (14.6 mGy). MEDICATIONS: Ancef 2 g administered within 1 hour of the procedure ANESTHESIA/SEDATION: Versed 1.0 mg IV; Fentanyl 25 mcg IV; Moderate Sedation Time:  20 minutes The patient was continuously monitored during the procedure by the interventional radiology nurse under my direct supervision. CONTRAST:  None. COMPLICATIONS: None immediate. PROCEDURE: Informed consent was obtained from the patient following explanation of the procedure, risks, benefits and alternatives. The patient understands, agrees and consents for the procedure. All questions were addressed. A time out was performed. Maximal barrier sterile technique utilized including caps, mask, sterile gowns, sterile gloves, large sterile drape, hand hygiene, and 2% chlorhexidine scrub. Under sterile conditions and local anesthesia, right internal jugular micropuncture venous access was performed with ultrasound. Images were obtained for documentation. A guide wire was inserted followed by a transitional dilator. Next, a 0.035 guidewire was advanced into the IVC with a 5-French catheter. Measurements were obtained from the right venotomy site to the proximal right atrium. In the right infraclavicular chest, a subcutaneous tunnel was  created under sterile conditions and local anesthesia. 1% lidocaine with epinephrine was utilized for this. The 23 cm tip to cuff dialysis catheter was tunneled subcutaneously to the venotomy site and inserted into the SVC/RA junction through a valved peel-away sheath. Position was confirmed with fluoroscopy. Images were obtained for documentation. Blood was aspirated from the catheter followed by saline and heparin flushes. The appropriate volume and strength of heparin was instilled in each lumen. Caps were applied. The catheter was secured at the tunnel site with Gelfoam and a pursestring suture. The venotomy site was closed with subcuticular Vicryl suture. Dermabond was applied to the small right neck incision. A dry sterile dressing was applied. The catheter is ready for use. No immediate complications. IMPRESSION: Ultrasound and fluoroscopically guided right internal jugular tunneled hemodialysis catheter (23 cm tip to cuff dialysis palindrome catheter). Electronically Signed   By: Judie Petit.  Shick M.D.   On: 03/31/2017 11:07      Scheduled Meds: . amLODipine  10 mg Oral Daily  . cloNIDine  0.3 mg Oral BID  . [START ON 04/02/2017] darbepoetin (ARANESP) injection - DIALYSIS  40 mcg Intravenous Q Mon-HD  . gabapentin  300 mg Oral TID  . heparin subcutaneous  5,000 Units Subcutaneous Q8H  . insulin aspart  0-15 Units Subcutaneous TID WC  . insulin detemir  45 Units Subcutaneous BID  . lanthanum  1,000 mg Oral TID WC  . metoprolol tartrate  50 mg Oral BID  . simvastatin  20 mg Oral Daily  . sodium bicarbonate  1,300 mg Oral BID   Continuous Infusions:    LOS: 2 days    Time spent in minutes: 35    Calvert Cantor, MD [facility] Pager: [email or web address] Password [email or web address] 04/01/2017, 1:20 PM

## 2017-04-02 ENCOUNTER — Inpatient Hospital Stay (HOSPITAL_COMMUNITY): Payer: BLUE CROSS/BLUE SHIELD

## 2017-04-02 LAB — RENAL FUNCTION PANEL
ANION GAP: 13 (ref 5–15)
Albumin: 2.4 g/dL — ABNORMAL LOW (ref 3.5–5.0)
BUN: 77 mg/dL — ABNORMAL HIGH (ref 6–20)
CALCIUM: 8.1 mg/dL — AB (ref 8.9–10.3)
CHLORIDE: 105 mmol/L (ref 101–111)
CO2: 19 mmol/L — ABNORMAL LOW (ref 22–32)
Creatinine, Ser: 12.2 mg/dL — ABNORMAL HIGH (ref 0.44–1.00)
GFR calc non Af Amer: 3 mL/min — ABNORMAL LOW (ref >60)
GFR, EST AFRICAN AMERICAN: 4 mL/min — AB (ref >60)
Glucose, Bld: 118 mg/dL — ABNORMAL HIGH (ref 65–99)
Phosphorus: 7.6 mg/dL — ABNORMAL HIGH (ref 2.5–4.6)
Potassium: 4.7 mmol/L (ref 3.5–5.1)
SODIUM: 137 mmol/L (ref 135–145)

## 2017-04-02 LAB — CBC
HCT: 21.9 % — ABNORMAL LOW (ref 36.0–46.0)
HEMOGLOBIN: 7.1 g/dL — AB (ref 12.0–15.0)
MCH: 29.1 pg (ref 26.0–34.0)
MCHC: 32.4 g/dL (ref 30.0–36.0)
MCV: 89.8 fL (ref 78.0–100.0)
PLATELETS: 287 10*3/uL (ref 150–400)
RBC: 2.44 MIL/uL — ABNORMAL LOW (ref 3.87–5.11)
RDW: 14.8 % (ref 11.5–15.5)
WBC: 12.6 10*3/uL — ABNORMAL HIGH (ref 4.0–10.5)

## 2017-04-02 LAB — GLUCOSE, CAPILLARY
GLUCOSE-CAPILLARY: 103 mg/dL — AB (ref 65–99)
GLUCOSE-CAPILLARY: 95 mg/dL (ref 65–99)
Glucose-Capillary: 58 mg/dL — ABNORMAL LOW (ref 65–99)
Glucose-Capillary: 83 mg/dL (ref 65–99)
Glucose-Capillary: 118 mg/dL — ABNORMAL HIGH (ref 65–99)

## 2017-04-02 MED ORDER — HEPARIN SODIUM (PORCINE) 1000 UNIT/ML DIALYSIS
1000.0000 [IU] | INTRAMUSCULAR | Status: DC | PRN
  Administered 2017-04-03: 1000 [IU] via INTRAVENOUS_CENTRAL

## 2017-04-02 MED ORDER — ALTEPLASE 2 MG IJ SOLR
2.0000 mg | Freq: Once | INTRAMUSCULAR | Status: DC | PRN
  Administered 2017-04-03: 2 mg

## 2017-04-02 MED ORDER — NA FERRIC GLUC CPLX IN SUCROSE 12.5 MG/ML IV SOLN
125.0000 mg | INTRAVENOUS | Status: DC
  Administered 2017-04-03 – 2017-04-10 (×4): 125 mg via INTRAVENOUS
  Filled 2017-04-02 (×12): qty 10

## 2017-04-02 MED ORDER — PENTAFLUOROPROP-TETRAFLUOROETH EX AERO: 1.0000 "application " | INHALATION_SPRAY | CUTANEOUS | Status: DC | PRN

## 2017-04-02 MED ORDER — INSULIN DETEMIR 100 UNIT/ML ~~LOC~~ SOLN
32.0000 [IU] | Freq: Two times a day (BID) | SUBCUTANEOUS | Status: DC
  Administered 2017-04-02 – 2017-04-12 (×16): 32 [IU] via SUBCUTANEOUS
  Filled 2017-04-02 (×28): qty 0.32

## 2017-04-02 MED ORDER — CLONIDINE HCL 0.1 MG PO TABS: 0.1000 mg | ORAL_TABLET | Freq: Two times a day (BID) | ORAL | Status: DC

## 2017-04-02 MED ORDER — DARBEPOETIN ALFA 200 MCG/0.4ML IJ SOSY
200.0000 ug | PREFILLED_SYRINGE | INTRAMUSCULAR | Status: DC
  Administered 2017-04-02: 200 ug via INTRAVENOUS

## 2017-04-02 MED ORDER — SODIUM CHLORIDE 0.9 % IV SOLN: 100.0000 mL | INTRAVENOUS | Status: DC | PRN

## 2017-04-02 MED ORDER — DARBEPOETIN ALFA 200 MCG/0.4ML IJ SOSY
PREFILLED_SYRINGE | INTRAMUSCULAR | Status: AC
  Administered 2017-04-02: 200 ug via INTRAVENOUS
  Filled 2017-04-02: qty 0.4

## 2017-04-02 MED ORDER — LIDOCAINE HCL (PF) 1 % IJ SOLN: 5.0000 mL | INTRAMUSCULAR | Status: DC | PRN

## 2017-04-02 MED ORDER — HEPARIN SODIUM (PORCINE) 1000 UNIT/ML DIALYSIS
100.0000 [IU]/kg | INTRAMUSCULAR | Status: DC | PRN
  Administered 2017-04-02: 9000 [IU] via INTRAVENOUS_CENTRAL
  Filled 2017-04-02 (×2): qty 9

## 2017-04-02 MED ORDER — DEXTROSE 50 % IV SOLN
INTRAVENOUS | Status: AC
  Administered 2017-04-02: 50 mL
  Filled 2017-04-02: qty 50

## 2017-04-02 MED ORDER — RENA-VITE PO TABS
1.0000 | ORAL_TABLET | Freq: Every day | ORAL | Status: DC
  Administered 2017-04-02 – 2017-04-13 (×11): 1 via ORAL
  Filled 2017-04-02 (×13): qty 1

## 2017-04-02 MED ORDER — HYDROCODONE-ACETAMINOPHEN 5-325 MG PO TABS
ORAL_TABLET | ORAL | Status: AC
  Administered 2017-04-02: 1 via ORAL
  Filled 2017-04-02: qty 1

## 2017-04-02 MED ORDER — LIDOCAINE-PRILOCAINE 2.5-2.5 % EX CREA: 1.0000 "application " | TOPICAL_CREAM | CUTANEOUS | Status: DC | PRN

## 2017-04-02 NOTE — Progress Notes (Signed)
Subjective: Interval History: has complaints leg pain, pins and needles.  Objective: Vital signs in last 24 hours: Temp:  [97.7 F (36.5 C)-98.3 F (36.8 C)] 98.3 F (36.8 C) (10/22 0422) Pulse Rate:  [68-88] 68 (10/22 0422) Resp:  [20-22] 21 (10/22 0422) BP: (146-187)/(56-77) 148/59 (10/22 0422) SpO2:  [98 %-100 %] 100 % (10/22 0422) Weight:  [89.6 kg (197 lb 8.5 oz)] 89.6 kg (197 lb 8.5 oz) (10/22 0500) Weight change: -2.5 kg (-5 lb 8.2 oz)  Intake/Output from previous day: 10/21 0701 - 10/22 0700 In: 1320 [P.O.:1320] Out: 725 [Urine:725] Intake/Output this shift: No intake/output data recorded.  General appearance: cooperative, morbidly obese, pale and slowed mentation Resp: diminished breath sounds bilaterally Chest wall: RIJ cath PC Cardio: S1, S2 normal and systolic murmur: systolic ejection 2/6, decrescendo at 2nd left intercostal space GI: obese, pos bs, liver down 6 cm Extremities: edema 2-3+  Lab Results:  Recent Labs  03/31/17 1801 04/01/17 0046  WBC 12.2* 11.6*  HGB 7.6* 7.9*  HCT 24.3* 24.6*  PLT 301 267   BMET:  Recent Labs  03/31/17 1758 04/01/17 0046  NA 137 135  K 7.2* 5.1  CL 108 104  CO2 16* 18*  GLUCOSE 109* 79  BUN 92* 68*  CREATININE 13.13* 10.73*  CALCIUM 7.9* 8.1*    Recent Labs  03/31/2017 2357  PTH 283*   Iron Studies:  Recent Labs  03/17/2017 2357  IRON 22*  TIBC 259  FERRITIN 73    Studies/Results: Ir Fluoro Guide Cv Line Right  Result Date: 03/31/2017 INDICATION: END-STAGE RENAL DISEASE, NO CURRENT ACCESS EXAM: ULTRASOUND GUIDANCE FOR VASCULAR ACCESS RIGHT INTERNAL JUGULAR PERMANENT HEMODIALYSIS CATHETER Date:  10/20/201810/20/2018 11:03 am Radiologist:  M. Ruel Favorsrevor Shick, MD Guidance:  Ultrasound and fluoroscopic FLUOROSCOPY TIME:  Fluoroscopy Time: 1 minutes 0 seconds (14.6 mGy). MEDICATIONS: Ancef 2 g administered within 1 hour of the procedure ANESTHESIA/SEDATION: Versed 1.0 mg IV; Fentanyl 25 mcg IV; Moderate  Sedation Time:  20 minutes The patient was continuously monitored during the procedure by the interventional radiology nurse under my direct supervision. CONTRAST:  None. COMPLICATIONS: None immediate. PROCEDURE: Informed consent was obtained from the patient following explanation of the procedure, risks, benefits and alternatives. The patient understands, agrees and consents for the procedure. All questions were addressed. A time out was performed. Maximal barrier sterile technique utilized including caps, mask, sterile gowns, sterile gloves, large sterile drape, hand hygiene, and 2% chlorhexidine scrub. Under sterile conditions and local anesthesia, right internal jugular micropuncture venous access was performed with ultrasound. Images were obtained for documentation. A guide wire was inserted followed by a transitional dilator. Next, a 0.035 guidewire was advanced into the IVC with a 5-French catheter. Measurements were obtained from the right venotomy site to the proximal right atrium. In the right infraclavicular chest, a subcutaneous tunnel was created under sterile conditions and local anesthesia. 1% lidocaine with epinephrine was utilized for this. The 23 cm tip to cuff dialysis catheter was tunneled subcutaneously to the venotomy site and inserted into the SVC/RA junction through a valved peel-away sheath. Position was confirmed with fluoroscopy. Images were obtained for documentation. Blood was aspirated from the catheter followed by saline and heparin flushes. The appropriate volume and strength of heparin was instilled in each lumen. Caps were applied. The catheter was secured at the tunnel site with Gelfoam and a pursestring suture. The venotomy site was closed with subcuticular Vicryl suture. Dermabond was applied to the small right neck incision. A dry sterile dressing  was applied. The catheter is ready for use. No immediate complications. IMPRESSION: Ultrasound and fluoroscopically guided right  internal jugular tunneled hemodialysis catheter (23 cm tip to cuff dialysis palindrome catheter). Electronically Signed   By: Judie Petit.  Shick M.D.   On: 03/31/2017 11:07   Ir US Guide Vasc Access Right  Result Date: 03/31/2017 INDICATION: END-STAGE RENAL DISEASE, NO CURRENT ACCESS EXAM: ULTRASOUND GUIDANCE FOR VASCULAR ACCESS RIGHT INTERNAL JUGULAR PERMANENT HEMODIALYSIS CATHETER Date:  10/20/201810/20/2018 11:03 am Radiologist:  M. Ruel Favors, MD Guidance:  Ultrasound and fluoroscopic FLUOROSCOPY TIME:  Fluoroscopy Time: 1 minutes 0 seconds (14.6 mGy). MEDICATIONS: Ancef 2 g administered within 1 hour of the procedure ANESTHESIA/SEDATION: Versed 1.0 mg IV; Fentanyl 25 mcg IV; Moderate Sedation Time:  20 minutes The patient was continuously monitored during the procedure by the interventional radiology nurse under my direct supervision. CONTRAST:  None. COMPLICATIONS: None immediate. PROCEDURE: Informed consent was obtained from the patient following explanation of the procedure, risks, benefits and alternatives. The patient understands, agrees and consents for the procedure. All questions were addressed. A time out was performed. Maximal barrier sterile technique utilized including caps, mask, sterile gowns, sterile gloves, large sterile drape, hand hygiene, and 2% chlorhexidine scrub. Under sterile conditions and local anesthesia, right internal jugular micropuncture venous access was performed with ultrasound. Images were obtained for documentation. A guide wire was inserted followed by a transitional dilator. Next, a 0.035 guidewire was advanced into the IVC with a 5-French catheter. Measurements were obtained from the right venotomy site to the proximal right atrium. In the right infraclavicular chest, a subcutaneous tunnel was created under sterile conditions and local anesthesia. 1% lidocaine with epinephrine was utilized for this. The 23 cm tip to cuff dialysis catheter was tunneled subcutaneously to the  venotomy site and inserted into the SVC/RA junction through a valved peel-away sheath. Position was confirmed with fluoroscopy. Images were obtained for documentation. Blood was aspirated from the catheter followed by saline and heparin flushes. The appropriate volume and strength of heparin was instilled in each lumen. Caps were applied. The catheter was secured at the tunnel site with Gelfoam and a pursestring suture. The venotomy site was closed with subcuticular Vicryl suture. Dermabond was applied to the small right neck incision. A dry sterile dressing was applied. The catheter is ready for use. No immediate complications. IMPRESSION: Ultrasound and fluoroscopically guided right internal jugular tunneled hemodialysis catheter (23 cm tip to cuff dialysis palindrome catheter). Electronically Signed   By: Judie Petit.  Shick M.D.   On: 03/31/2017 11:07    I have reviewed the patient's current medications.  Assessment/Plan: 1 ESRD vol xs.  Lower at HD, Needs lower solute, vol.  Needs perm access. 2 HTN vol xs, lower vol and meds 3 Anemia ^esa, give Fe 4 HPTH vit D 5 Obesity 6 DM 7 Neuropathic pain per primary P HD, esa, Fe, , vit D, check PTh    LOS: 3 days   Teresa Dalton 04/02/2017,8:04 AM

## 2017-04-02 NOTE — Progress Notes (Signed)
CRITICAL VALUE ALERT  Critical Value:  CBG 58  Date & Time Notied: 04/02/17 0800    Provider Notified: Rizwan   Orders Received/Actions taken: MD notified Gave D50 Dextrose Rechecked CBG- 103 Will continue to monitor

## 2017-04-02 NOTE — Progress Notes (Signed)
HD tx initiated via HD cath w/o problem, pull/push/flush equally w/o problem, VSS, will cont to monitor while on HD tx 

## 2017-04-02 NOTE — Progress Notes (Signed)
PROGRESS NOTE    Teresa Dalton   ZOX:[id number]  DOB: 08-Feb-1966  DOA: 04-06-2017 PCP: Patient, No Pcp Per   Brief Narrative:  Teresa Kayser. is a 51 y.o. female withHTN, DM2  medical history significant of CKD stage 4 back in 2015 who hasn't seen nephrology since. Patient presents to the ED with pain in feet and leg due to  worsening peripheral edema over the past few weeks.     Subjective: Continues to have leg pain. Feeling pins and needles. No other complaints.   Assessment & Plan:   Principal Problem:   Acute on chronic kidney failure/ ESRD Mild hyperkalemia Metabolic acidosis - 10/20- received dialysis cath & dialyzed  - vascular consult for fistula/graft- needs vein mapping  - CLIP process  Active Problems: Severe pedal edema/ pain - due to above- venous duplex negative - elevate- continue dialysis- PRN Hydrocodone for now until swelling improves - cont Neurontin at current dose  Essential HTN - due to fluid overloaded - cont Norvasc, Clonidine, Labetalol - holding HCTZ, Losartan      DM2 (diabetes mellitus, type 2)  - holding Amaryl- cont Lantus and SSI- hypoglycemic again today- cut back on Lantus again today   Anemia of chronic disease - transfuse per nephrology   Obesity Body mass index is 32.87 kg/m.  DVT prophylaxis: Heparin Code Status: Full code Family Communication:  Disposition Plan: follow in hospital  Consultants:   Nephrology  Vascular surgery  IR Procedures:  Bilateral lower extremity venous duplex:  Preliminary findings: No evidence of deep vein thrombosis in the visualized veins of the lower extremities or baker's cysts bilaterally.  Very difficult exam due to patient body habitus, positioning and severe swelling.  Antimicrobials:  Anti-infectives    Start     Dose/Rate Route Frequency Ordered Stop   03/31/17 1100  ceFAZolin (ANCEF) IVPB 2g/100 mL premix     2 g 200 mL/hr over 30 Minutes Intravenous  Once 03/31/17  1045 03/31/17 1113   03/31/17 1037  ceFAZolin (ANCEF) 2-4 GM/100ML-% IVPB    Comments:  Ofori, Evelyn   : cabinet override      03/31/17 1037 03/31/17 2244     Objective: Vitals:   04/01/17 2109 04/02/17 0422 04/02/17 0500 04/02/17 1000  BP: (!) 159/62 (!) 148/59  (!) 163/58  Pulse: 88 68  74  Resp: (!) 21 (!) 21  20  Temp: 98.3 F (36.8 C) 98.3 F (36.8 C)  98.1 F (36.7 C)  TempSrc: Oral Oral  Oral  SpO2: 100% 100%  100%  Weight: 89.6 kg (197 lb 8.5 oz)  89.6 kg (197 lb 8.5 oz)   Height:        Intake/Output Summary (Last 24 hours) at 04/02/17 1344 Last data filed at 04/02/17 0813  Gross per 24 hour  Intake              840 ml  Output              925 ml  Net              -85 ml   Filed Weights   04/01/17 0500 04/01/17 2109 04/02/17 0500  Weight: 89.5 kg (197 lb 5 oz) 89.6 kg (197 lb 8.5 oz) 89.6 kg (197 lb 8.5 oz)    Examination: General exam: Appears comfortable - somnolent  HEENT: PERRLA, oral mucosa moist, no sclera icterus or thrush Respiratory system: Clear to auscultation. Respiratory effort normal. Cardiovascular system: S1 & S2 heard,  RRR.  No murmurs  Gastrointestinal system: Abdomen soft, non-tender, nondistended. Normal bowel sound. No organomegaly Central nervous system: Alert and oriented. No focal neurological deficits. Extremities: No cyanosis, clubbing - 2 + pitting edema Skin: No rashes or ulcers Psychiatry: somnolent    Data Reviewed: I have personally reviewed following labs and imaging studies  CBC:  Recent Labs Lab 03/20/2017 1937 03/31/17 0215 03/31/17 1801 04/01/17 0046  WBC 12.2* 9.9 12.2* 11.6*  NEUTROABS 9.1*  --   --  9.0*  HGB 8.2* 7.6* 7.6* 7.9*  HCT 26.3* 23.5* 24.3* 24.6*  MCV 92.9 92.2 92.7 92.1  PLT 281 262 301 267   Basic Metabolic Panel:  Recent Labs Lab 03/13/2017 1937 03/31/17 0215 03/31/17 1758 04/01/17 0046  NA 136 137  137 137 135  K 6.1* 5.7*  5.7* 7.2* 5.1  CL 110 110  109 108 104  CO2 13* 15*  16*  16* 18*  GLUCOSE 166* 147*  147* 109* 79  BUN 89* 89*  90* 92* 68*  CREATININE 13.26* 13.13*  12.99* 13.13* 10.73*  CALCIUM 8.1* 8.1*  7.9* 7.9* 8.1*  PHOS  --  8.8* 8.6*  --    GFR: Estimated Creatinine Clearance: 7.1 mL/min (A) (by C-G formula based on SCr of 10.73 mg/dL (H)). Liver Function Tests:  Recent Labs Lab 04/06/2017 1937 03/31/17 0215 03/31/17 1758  AST 11*  --   --   ALT 11*  --   --   ALKPHOS 118  --   --   BILITOT 0.8  --   --   PROT 6.8  --   --   ALBUMIN 2.8* 2.5* 2.6*   No results for input(s): LIPASE, AMYLASE in the last 168 hours. No results for input(s): AMMONIA in the last 168 hours. Coagulation Profile: No results for input(s): INR, PROTIME in the last 168 hours. Cardiac Enzymes: No results for input(s): CKTOTAL, CKMB, CKMBINDEX, TROPONINI in the last 168 hours. BNP (last 3 results) No results for input(s): PROBNP in the last 8760 hours. HbA1C:  Recent Labs  03/31/17 0215  HGBA1C 7.8*   CBG:  Recent Labs Lab 04/01/17 1719 04/01/17 2106 04/02/17 0743 04/02/17 0820 04/02/17 1226  GLUCAP 87 141* 58* 103* 95   Lipid Profile: No results for input(s): CHOL, HDL, LDLCALC, TRIG, CHOLHDL, LDLDIRECT in the last 72 hours. Thyroid Function Tests: No results for input(s): TSH, T4TOTAL, FREET4, T3FREE, THYROIDAB in the last 72 hours. Anemia Panel:  Recent Labs  04/10/2017 2357  FERRITIN 73  TIBC 259  IRON 22*   Urine analysis:    Component Value Date/Time   COLORURINE YELLOW 04/06/2017 2341   APPEARANCEUR CLEAR 03/23/2017 2341   LABSPEC 1.014 03/29/2017 2341   PHURINE 6.0 04/11/2017 2341   GLUCOSEU 150 (A) 03/26/2017 2341   HGBUR NEGATIVE 03/28/2017 2341   BILIRUBINUR NEGATIVE 03/19/2017 2341   KETONESUR NEGATIVE 04/02/2017 2341   PROTEINUR 100 (A) 04/04/2017 2341   UROBILINOGEN 0.2 05/30/2010 1628   NITRITE NEGATIVE 03/29/2017 2341   LEUKOCYTESUR TRACE (A) 04/08/2017 2341   Sepsis  Labs: @LABRCNTIP (procalcitonin:4,lacticidven:4) ) Recent Results (from the past 240 hour(s))  MRSA PCR Screening     Status: None   Collection Time: 04/01/17  2:45 PM  Result Value Ref Range Status   MRSA by PCR NEGATIVE NEGATIVE Final    Comment:        The GeneXpert MRSA Assay (FDA approved for NASAL specimens only), is one component of a comprehensive MRSA colonization surveillance program. It is  not intended to diagnose MRSA infection nor to guide or monitor treatment for MRSA infections.          Radiology Studies: No results found.    Scheduled Meds: . amLODipine  10 mg Oral Daily  . cloNIDine  0.1 mg Oral BID  . darbepoetin (ARANESP) injection - DIALYSIS  200 mcg Intravenous Q Mon-HD  . gabapentin  300 mg Oral TID  . heparin subcutaneous  5,000 Units Subcutaneous Q8H  . insulin aspart  0-15 Units Subcutaneous TID WC  . insulin detemir  32 Units Subcutaneous BID  . labetalol  200 mg Oral BID  . lanthanum  1,000 mg Oral TID WC  . multivitamin  1 tablet Oral QHS  . simvastatin  20 mg Oral Daily   Continuous Infusions: . [START ON 04/03/2017] ferric gluconate (FERRLECIT/NULECIT) IV       LOS: 3 days    Time spent in minutes: 35    Calvert Cantor, MD [facility] Pager: [email or web address] Password [email or web address] 04/02/2017, 1:44 PM

## 2017-04-03 ENCOUNTER — Inpatient Hospital Stay (HOSPITAL_COMMUNITY): Payer: BLUE CROSS/BLUE SHIELD

## 2017-04-03 ENCOUNTER — Encounter (HOSPITAL_COMMUNITY): Payer: Self-pay | Admitting: General Practice

## 2017-04-03 DIAGNOSIS — N185 Chronic kidney disease, stage 5: Secondary | ICD-10-CM

## 2017-04-03 DIAGNOSIS — M7989 Other specified soft tissue disorders: Secondary | ICD-10-CM

## 2017-04-03 DIAGNOSIS — D72829 Elevated white blood cell count, unspecified: Secondary | ICD-10-CM

## 2017-04-03 DIAGNOSIS — R509 Fever, unspecified: Secondary | ICD-10-CM

## 2017-04-03 LAB — URINALYSIS, ROUTINE W REFLEX MICROSCOPIC
BILIRUBIN URINE: NEGATIVE
GLUCOSE, UA: 50 mg/dL — AB
Ketones, ur: NEGATIVE mg/dL
LEUKOCYTES UA: NEGATIVE
NITRITE: NEGATIVE
Specific Gravity, Urine: 1.011 (ref 1.005–1.030)
pH: 7 (ref 5.0–8.0)

## 2017-04-03 LAB — GLUCOSE, CAPILLARY
GLUCOSE-CAPILLARY: 104 mg/dL — AB (ref 65–99)
Glucose-Capillary: 96 mg/dL (ref 65–99)
Glucose-Capillary: 127 mg/dL — ABNORMAL HIGH (ref 65–99)
Glucose-Capillary: 152 mg/dL — ABNORMAL HIGH (ref 65–99)

## 2017-04-03 LAB — CBC
HEMATOCRIT: 23.7 % — AB (ref 36.0–46.0)
Hemoglobin: 7.6 g/dL — ABNORMAL LOW (ref 12.0–15.0)
MCH: 29.2 pg (ref 26.0–34.0)
MCHC: 32.1 g/dL (ref 30.0–36.0)
MCV: 91.2 fL (ref 78.0–100.0)
PLATELETS: 310 10*3/uL (ref 150–400)
RBC: 2.6 MIL/uL — AB (ref 3.87–5.11)
RDW: 14.2 % (ref 11.5–15.5)
WBC: 14.5 10*3/uL — ABNORMAL HIGH (ref 4.0–10.5)

## 2017-04-03 LAB — RENAL FUNCTION PANEL
ANION GAP: 13 (ref 5–15)
Albumin: 2.5 g/dL — ABNORMAL LOW (ref 3.5–5.0)
BUN: 49 mg/dL — ABNORMAL HIGH (ref 6–20)
CHLORIDE: 103 mmol/L (ref 101–111)
CO2: 22 mmol/L (ref 22–32)
Calcium: 8.5 mg/dL — ABNORMAL LOW (ref 8.9–10.3)
Creatinine, Ser: 9.22 mg/dL — ABNORMAL HIGH (ref 0.44–1.00)
GFR calc Af Amer: 5 mL/min — ABNORMAL LOW (ref >60)
GFR, EST NON AFRICAN AMERICAN: 4 mL/min — AB (ref >60)
Glucose, Bld: 150 mg/dL — ABNORMAL HIGH (ref 65–99)
POTASSIUM: 4.5 mmol/L (ref 3.5–5.1)
Phosphorus: 5.5 mg/dL — ABNORMAL HIGH (ref 2.5–4.6)
Sodium: 138 mmol/L (ref 135–145)

## 2017-04-03 MED ORDER — ALTEPLASE 2 MG IJ SOLR
INTRAMUSCULAR | Status: AC
  Administered 2017-04-03: 2 mg
  Filled 2017-04-03: qty 2

## 2017-04-03 MED ORDER — AMLODIPINE BESYLATE 10 MG PO TABS
10.0000 mg | ORAL_TABLET | Freq: Every day | ORAL | Status: DC
  Administered 2017-04-03 – 2017-04-13 (×8): 10 mg via ORAL
  Filled 2017-04-03 (×11): qty 1

## 2017-04-03 MED ORDER — HYDROCODONE-ACETAMINOPHEN 5-325 MG PO TABS
ORAL_TABLET | ORAL | Status: AC
  Administered 2017-04-03: 1 via ORAL
  Filled 2017-04-03: qty 1

## 2017-04-03 MED ORDER — CALCITRIOL 0.5 MCG PO CAPS
0.5000 ug | ORAL_CAPSULE | ORAL | Status: DC
  Administered 2017-04-03 – 2017-04-09 (×3): 0.5 ug via ORAL
  Filled 2017-04-03 (×3): qty 1

## 2017-04-03 MED ORDER — HYDROCODONE-ACETAMINOPHEN 5-325 MG PO TABS
1.0000 | ORAL_TABLET | Freq: Four times a day (QID) | ORAL | Status: DC | PRN
  Administered 2017-04-04 – 2017-04-05 (×4): 1 via ORAL
  Filled 2017-04-03 (×3): qty 1

## 2017-04-03 MED ORDER — SENNOSIDES-DOCUSATE SODIUM 8.6-50 MG PO TABS
1.0000 | ORAL_TABLET | Freq: Every evening | ORAL | Status: DC | PRN
  Administered 2017-04-13: 1 via ORAL
  Filled 2017-04-03 (×2): qty 1

## 2017-04-03 MED ORDER — HYDRALAZINE HCL 20 MG/ML IJ SOLN: 10.0000 mg | Freq: Four times a day (QID) | INTRAMUSCULAR | Status: DC | PRN

## 2017-04-03 NOTE — Progress Notes (Addendum)
PROGRESS NOTE    Kenecia Barren   OZH:[id number]  DOB: 1966/03/02  DOA: 04/03/2017 PCP: Patient, No Pcp Per   Brief Narrative:  Roma Kayser. is a 51 y.o. female withHTN, DM2  medical history significant of CKD stage 4 back in 2015 who hasn't seen nephrology since. Patient presents to the ED with pain in feet and leg due to  worsening peripheral edema over the past few weeks.     Subjective: No complaints. Groggy today.   Assessment & Plan:   Principal Problem:   Acute on chronic kidney failure/ ESRD Mild hyperkalemia Metabolic acidosis - 10/20- received dialysis cath & dialyzed  - vascular consult for fistula/graft  vein mapping done today - CLIP process  Active Problems: Severe pedal edema/ pain - due to above- venous duplex negative - elevate- continue dialysis- PRN Hydrocodone for now until swelling improves - cont Neurontin at current dose- will not increase dose yet as symptoms might improve once swelling resolves  Fever - 100.2 noted on vital signs around 2 PM- WBC count steadily rising - follow- check UA, CXR and blood cultures   Essential HTN - due to fluid overload - cont Norvasc, Labetalol - holding HCTZ, Losartan - Clonidine stopped today by nephrology- use Hydralazine PRN     DM2 (diabetes mellitus, type 2)  - holding Amaryl- cont Lantus and SSI- hypoglycemic over the past 2 days but stable today- have cut back on Levemir  Anemia of chronic disease - transfuse per nephrology   Obesity Body mass index is 46.78 kg/m.  DVT prophylaxis: Heparin Code Status: Full code Family Communication:  Disposition Plan: follow in hospital  Consultants:   Nephrology  Vascular surgery  IR Procedures:  Bilateral lower extremity venous duplex:  Preliminary findings: No evidence of deep vein thrombosis in the visualized veins of the lower extremities or baker's cysts bilaterally.  Very difficult exam due to patient body habitus, positioning and severe  swelling.  Antimicrobials:  Anti-infectives    Start     Dose/Rate Route Frequency Ordered Stop   03/31/17 1100  ceFAZolin (ANCEF) IVPB 2g/100 mL premix     2 g 200 mL/hr over 30 Minutes Intravenous  Once 03/31/17 1045 03/31/17 1113   03/31/17 1037  ceFAZolin (ANCEF) 2-4 GM/100ML-% IVPB    Comments:  Ofori, Evelyn   : cabinet override      03/31/17 1037 03/31/17 2244     Objective: Vitals:   04/03/17 1500 04/03/17 1530 04/03/17 1600 04/03/17 1630  BP: (!) 141/68 (!) 178/100 (!) 194/92 (!) 201/82  Pulse: 87 83 82 88  Resp: (!) 22 (!) 23 (!) 23 (!) 23  Temp:      TempSrc:      SpO2:      Weight:      Height:        Intake/Output Summary (Last 24 hours) at 04/03/17 1719 Last data filed at 04/03/17 0840  Gross per 24 hour  Intake                0 ml  Output             4300 ml  Net            -4300 ml   Filed Weights   04/03/17 0037 04/03/17 0542 04/03/17 1430  Weight: 128.2 kg (282 lb 10.1 oz) 128 kg (282 lb 3 oz) 127.5 kg (281 lb 1.4 oz)    Examination: General exam: Appears comfortable - somnolent  HEENT:  PERRLA, oral mucosa moist, no sclera icterus or thrush Respiratory system: Clear to auscultation. Respiratory effort normal. Cardiovascular system: S1 & S2 heard, 2/6 murmur at right sternal border Gastrointestinal system: Abdomen soft, non-tender, nondistended. Normal bowel sound. No organomegaly Central nervous system: Alert and oriented. No focal neurological deficits. Extremities: No cyanosis, clubbing - 2 + pitting edema Skin: No rashes or ulcers Psychiatry: somnolent    Data Reviewed: I have personally reviewed following labs and imaging studies  CBC:  Recent Labs Lab 03/26/2017 1937 03/31/17 0215 03/31/17 1801 04/01/17 0046 04/02/17 2154 04/03/17 1536  WBC 12.2* 9.9 12.2* 11.6* 12.6* 14.5*  NEUTROABS 9.1*  --   --  9.0*  --   --   HGB 8.2* 7.6* 7.6* 7.9* 7.1* 7.6*  HCT 26.3* 23.5* 24.3* 24.6* 21.9* 23.7*  MCV 92.9 92.2 92.7 92.1 89.8 91.2  PLT  281 262 301 267 287 310   Basic Metabolic Panel:  Recent Labs Lab 03/31/17 0215 03/31/17 1758 04/01/17 0046 04/02/17 2100 04/03/17 1536  NA 137  137 137 135 137 138  K 5.7*  5.7* 7.2* 5.1 4.7 4.5  CL 110  109 108 104 105 103  CO2 15*  16* 16* 18* 19* 22  GLUCOSE 147*  147* 109* 79 118* 150*  BUN 89*  90* 92* 68* 77* 49*  CREATININE 13.13*  12.99* 13.13* 10.73* 12.20* 9.22*  CALCIUM 8.1*  7.9* 7.9* 8.1* 8.1* 8.5*  PHOS 8.8* 8.6*  --  7.6* 5.5*   GFR: Estimated Creatinine Clearance: 10 mL/min (A) (by C-G formula based on SCr of 9.22 mg/dL (H)). Liver Function Tests:  Recent Labs Lab 03/27/2017 1937 03/31/17 0215 03/31/17 1758 04/02/17 2100 04/03/17 1536  AST 11*  --   --   --   --   ALT 11*  --   --   --   --   ALKPHOS 118  --   --   --   --   BILITOT 0.8  --   --   --   --   PROT 6.8  --   --   --   --   ALBUMIN 2.8* 2.5* 2.6* 2.4* 2.5*   No results for input(s): LIPASE, AMYLASE in the last 168 hours. No results for input(s): AMMONIA in the last 168 hours. Coagulation Profile: No results for input(s): INR, PROTIME in the last 168 hours. Cardiac Enzymes: No results for input(s): CKTOTAL, CKMB, CKMBINDEX, TROPONINI in the last 168 hours. BNP (last 3 results) No results for input(s): PROBNP in the last 8760 hours. HbA1C: No results for input(s): HGBA1C in the last 72 hours. CBG:  Recent Labs Lab 04/02/17 1226 04/02/17 1701 04/02/17 2038 04/03/17 0817 04/03/17 1056  GLUCAP 95 83 118* 96 127*   Lipid Profile: No results for input(s): CHOL, HDL, LDLCALC, TRIG, CHOLHDL, LDLDIRECT in the last 72 hours. Thyroid Function Tests: No results for input(s): TSH, T4TOTAL, FREET4, T3FREE, THYROIDAB in the last 72 hours. Anemia Panel: No results for input(s): VITAMINB12, FOLATE, FERRITIN, TIBC, IRON, RETICCTPCT in the last 72 hours. Urine analysis:    Component Value Date/Time   COLORURINE YELLOW 03/26/2017 2341   APPEARANCEUR CLEAR 04/02/2017 2341   LABSPEC  1.014 04/01/2017 2341   PHURINE 6.0 04/06/2017 2341   GLUCOSEU 150 (A) 03/13/2017 2341   HGBUR NEGATIVE 03/19/2017 2341   BILIRUBINUR NEGATIVE 04/02/2017 2341   KETONESUR NEGATIVE 03/29/2017 2341   PROTEINUR 100 (A) 04/06/2017 2341   UROBILINOGEN 0.2 05/30/2010 1628   NITRITE NEGATIVE 03/22/2017  2341   LEUKOCYTESUR TRACE (A) 04/01/2017 2341   Sepsis Labs: @LABRCNTIP (procalcitonin:4,lacticidven:4) ) Recent Results (from the past 240 hour(s))  MRSA PCR Screening     Status: None   Collection Time: 04/01/17  2:45 PM  Result Value Ref Range Status   MRSA by PCR NEGATIVE NEGATIVE Final    Comment:        The GeneXpert MRSA Assay (FDA approved for NASAL specimens only), is one component of a comprehensive MRSA colonization surveillance program. It is not intended to diagnose MRSA infection nor to guide or monitor treatment for MRSA infections.          Radiology Studies: No results found.    Scheduled Meds: . amLODipine  10 mg Oral QHS  . calcitRIOL  0.5 mcg Oral QODAY  . darbepoetin (ARANESP) injection - DIALYSIS  200 mcg Intravenous Q Mon-HD  . gabapentin  300 mg Oral TID  . heparin subcutaneous  5,000 Units Subcutaneous Q8H  . insulin aspart  0-15 Units Subcutaneous TID WC  . insulin detemir  32 Units Subcutaneous BID  . labetalol  200 mg Oral BID  . lanthanum  1,000 mg Oral TID WC  . multivitamin  1 tablet Oral QHS  . simvastatin  20 mg Oral Daily   Continuous Infusions: . sodium chloride    . sodium chloride    . ferric gluconate (FERRLECIT/NULECIT) IV       LOS: 4 days    Time spent in minutes: 35    Calvert Cantor, MD [facility] Pager: [email or web address] Password [email or web address] 04/03/2017, 5:19 PM

## 2017-04-03 NOTE — Consult Note (Signed)
[facility] [place]  Patient name: Teresa Dalton MRN: [id number] DOB: Sep 21, 1965 Sex: female   REQUESTING PROVIDER:   Renal    REASON FOR CONSULT:    Dialysis access  HISTORY OF PRESENT ILLNESS:   Teresa Crass. is a 51 y.o. female, who I have been asked to evaluate for dialysis access.  She is right-handed.  She currently has a catheter in place.  Her renal failure is secondary to diabetes and hypertension.  She presented to the emergency department with worsening peripheral edema over the past several weeks.  She is complaining of pain in both her legs.  PAST MEDICAL HISTORY    Past Medical History:  Diagnosis Date  . Anemia of chronic disease    /notes 04/02/2017  . CKD (chronic kidney disease), stage IV (HCC)    rin 2015/notes 04/02/2017; enal insufficiency (1.6-1.9 creatinine)  . ESRD (end stage renal disease) on dialysis [facility])    "new onset; just started dialysis 2 days ago" (04/03/2017)  . Hyperlipidemia   . Hypertension   . Thyroid disease    ? past problem  . Type II diabetes mellitus (HCC)      FAMILY HISTORY   Family History  Problem Relation Age of Onset  . Diabetes Mother   . Heart disease Mother   . Hyperlipidemia Mother   . Cancer Father        lung, non-smoker. deceased 35yr  . Diabetes Sister   . Heart disease Brother        MI @ 14    SOCIAL HISTORY:   Social History   Social History  . Marital status: Single    Spouse name: N/A  . Number of children: 2  . Years of education: N/A   Occupational History  . [employer]   Social History Main Topics  . Smoking status: Current Every Day Smoker    Packs/day: 0.25    Years: 30.00    Types: Cigarettes  . Smokeless tobacco: Never Used  . Alcohol use No  . Drug use: Yes    Types: Marijuana     Comment: 04/03/2017 "not a routine thing"  . Sexual activity: Not Currently   Other Topics  Concern  . Not on file   Social History Narrative   Regular exercise: no   Lives with one of her sons             ALLERGIES:    No Known Allergies  CURRENT MEDICATIONS:    Current Facility-Administered Medications  Medication Dose Route Frequency Provider Last Rate Last Dose  . acetaminophen (TYLENOL) tablet 650 mg  650 mg Oral Q6H PRN Hillary Bow, DO   650 mg at 04/02/17 4696   Or  . acetaminophen (TYLENOL) suppository 650 mg  650 mg Rectal Q6H PRN Hillary Bow, DO      . amLODipine (NORVASC) tablet 10 mg  10 mg Oral QHS Deterding, Fayrene Fearing, MD      . calcitRIOL (ROCALTROL) capsule 0.5 mcg  0.5 mcg Oral Alric Ran, MD      . Darbepoetin Alfa (ARANESP) injection 200 mcg  200 mcg Intravenous Q Mon-HD Beryle Lathe, MD   200 mcg at 04/02/17 2240  . ferric gluconate (NULECIT) 125 mg in sodium chloride 0.9 % 100 mL IVPB  125 mg Intravenous Q T,Th,Sa-HD Beryle Lathe, MD   Stopped at 04/03/17 1744  . gabapentin (NEURONTIN) capsule 300 mg  300 mg Oral TID  Hillary Bow, DO   300 mg at 04/03/17 1158  . heparin injection 5,000 Units  5,000 Units Subcutaneous Q8H Calvert Cantor, MD   5,000 Units at 04/03/17 0550  . HYDROcodone-acetaminophen (NORCO/VICODIN) 5-325 MG per tablet 1 tablet  1 tablet Oral Q6H PRN Calvert Cantor, MD      . insulin aspart (novoLOG) injection 0-15 Units  0-15 Units Subcutaneous TID WC Hillary Bow, DO   2 Units at 04/03/17 1201  . insulin detemir (LEVEMIR) injection 32 Units  32 Units Subcutaneous BID Calvert Cantor, MD   32 Units at 04/03/17 1200  . labetalol (NORMODYNE) tablet 200 mg  200 mg Oral BID Calvert Cantor, MD   200 mg at 04/01/17 2158  . lanthanum (FOSRENOL) chewable tablet 1,000 mg  1,000 mg Oral TID WC Terrial Rhodes, MD   1,000 mg at 04/03/17 1159  . lidocaine (PF) (XYLOCAINE) 1 % injection    PRN Berdine Dance, MD   10 mL at 03/31/17 1115  . multivitamin (RENA-VIT) tablet 1 tablet  1 tablet Oral Vernetta Honey,  MD   1 tablet at 04/02/17 2049  . ondansetron (ZOFRAN) tablet 4 mg  4 mg Oral Q6H PRN Hillary Bow, DO       Or  . ondansetron [facility]) injection 4 mg  4 mg Intravenous Q6H PRN Hillary Bow, DO      . senna-docusate (Senokot-S) tablet 1 tablet  1 tablet Oral QHS PRN Calvert Cantor, MD      . simvastatin (ZOCOR) tablet 20 mg  20 mg Oral Daily Julian Reil, Jared M, DO   20 mg at 04/01/17 1019    REVIEW OF SYSTEMS:   [X]  denotes positive finding, [ ]  denotes negative finding Cardiac  Comments:  Chest pain or chest pressure:    Shortness of breath upon exertion:    Short of breath when lying flat:    Irregular heart rhythm:        Vascular    Pain in calf, thigh, or hip brought on by ambulation:    Pain in feet at night that wakes you up from your sleep:     Blood clot in your veins:    Leg swelling:  x       Pulmonary    Oxygen at home:    Productive cough:     Wheezing:         Neurologic    Sudden weakness in arms or legs:     Sudden numbness in arms or legs:     Sudden onset of difficulty speaking or slurred speech:    Temporary loss of vision in one eye:     Problems with dizziness:         Gastrointestinal    Blood in stool:      Vomited blood:         Genitourinary    Burning when urinating:     Blood in urine:        Psychiatric    Major depression:         Hematologic    Bleeding problems:    Problems with blood clotting too easily:        Skin    Rashes or ulcers:        Constitutional    Fever or chills:     PHYSICAL EXAM:   Vitals:   04/03/17 1630 04/03/17 1700 04/03/17 1730 04/03/17 1744  BP: (!) 201/82 (!) 206/108  (!) 150/99  Pulse: 88 89 87 87  Resp: (!) 23 (!) 22 (!) 22 (!) 22  Temp:    100.1 F (37.8 C)  TempSrc:    Oral  SpO2:    98%  Weight:    273 lb 5.9 oz (124 kg)  Height:        GENERAL: The patient is a well-nourished female, in no acute distress. The vital signs are documented above. CARDIAC: There is a regular rate and  rhythm.  VASCULAR: Palpable left radial and brachial pulse.  Bilateral lower extremity edema.  Right-sided dialysis catheter in place. PULMONARY: Nonlabored respirations MUSCULOSKELETAL: There are no major deformities or cyanosis. NEUROLOGIC: No focal weakness or paresthesias are detected. SKIN: There are no ulcers or rashes noted. PSYCHIATRIC: The patient has a normal affect.  STUDIES:   +-----------------+--------+-----+--------+ Right Cephalic  DiameterDepthFindings +-----------------+--------+-----+--------+ Prox upper arm   4.00 13.40     +-----------------+--------+-----+--------+ Mid upper arm   4.80 6.60      +-----------------+--------+-----+--------+ Dist upper arm   5.50 3.50      +-----------------+--------+-----+--------+ Antecubital fossa 4.10 2.60      +-----------------+--------+-----+--------+ Prox forearm    5.00 2.70      +-----------------+--------+-----+--------+ Mid forearm    3.40 3.80      +-----------------+--------+-----+--------+ Dist forearm    2.90 3.20      +-----------------+--------+-----+--------+ Wrist       2.20 3.30      +-----------------+--------+-----+--------+  +-----------------+--------+-----+--------+ Right Basilic  DiameterDepthFindings +-----------------+--------+-----+--------+ Mid upper arm   4.30 18.40     +-----------------+--------+-----+--------+ Dist upper arm   3.90 7.70      +-----------------+--------+-----+--------+ Antecubital fossa 2.20 2.70      +-----------------+--------+-----+--------+ Prox forearm    4.10 4.80      +-----------------+--------+-----+--------+  Basilic Mid forearm diameter 1.6XW3.3mm , depth 9.6 mm. +-----------------+--------+-----+----------+ Left Cephalic  DiameterDepth Findings   +-----------------+--------+-----+----------+ Prox upper arm   2.80 2.90       +-----------------+--------+-----+----------+ Mid upper arm   3.70 4.30       +-----------------+--------+-----+----------+ Dist upper arm   3.20 3.60 Thrombosed +-----------------+--------+-----+----------+ Antecubital fossa 2.00 4.20 Thrombosed +-----------------+--------+-----+----------+ Prox forearm    3.20 4.10 Thrombosed +-----------------+--------+-----+----------+ Mid forearm    3.60 6.80       +-----------------+--------+-----+----------+ Wrist       1.90 2.50       +-----------------+--------+-----+----------+  +-----------------+--------+-----+---------+ Left Basilic   DiameterDepthFindings  +-----------------+--------+-----+---------+ Mid upper arm   3.70 10.70      +-----------------+--------+-----+---------+ Dist upper arm   3.50 7.40       +-----------------+--------+-----+---------+ Antecubital fossa 3.50 6.00       +-----------------+--------+-----+---------+ Prox forearm    3.10 5.60 branching +-----------------+--------+-----+---------+  Left Basilic - Mid forearm diameter 2.8 mm depth 4.5 mm Left Basilic Wrist diameter 2.0 mm depth 3.8 mm  ASSESSMENT and PLAN   End-stage renal disease: I discussed proceeding with a left arm fistula given that she is right-handed.  After reviewing her vein map, her cephalic vein appears to be thrombosed at the antecubital crease.  Therefore I think proceeding with a first stage basilic vein fistula is her best option.  I discussed the details of the procedure with her.  I discussed the risk of non-maturity, the need for future interventions, including the need for a second operation for elevation of her fistula.  We also discussed the possibility of steal syndrome.  Currently she is on the schedule for Thursday  afternoon.   Durene CalWells Renly Guedes, MD [facility]  Tel [phone] Pager [phone]

## 2017-04-03 NOTE — Progress Notes (Signed)
Bilateral upper extremity vein mapping completed. See results in CV Proc.  [place] Myosha Cuadras,RVS 04/03/2017 11:38 am

## 2017-04-03 NOTE — Progress Notes (Signed)
HD tx completed @ 0030 w/o problem, UF goal met, blood rinsed back, VSS, report called to May Petalcorin, RN

## 2017-04-03 NOTE — Consult Note (Signed)
[facility] NOTE   MRN : [id number]  Reason for Consult: AKI/CKD Referring Physician: Dr. Arrie Aran  History of Present Illness: HPI:  Teresa Dalton is a 51 yo AAF with PMH significant for longstanding, poorly controlled HTN, DM, morbid obesity, hyperlipidemia, and progressive CKD.  She was last seen by Dr. Darrick Penna in November 2015 and was told of having stage IV CKD but never went back.  She works in [place] and shares her time either there or in [place].  She presented to [facility] with complaints of lower extremity edema and pain which has been worsening over the past few weeks.  She has been taking 3 naproxens daily for the discomfort and admits to having SBP's in the 160's-170's at home.  She is currently on HD via right IJ.       Current Facility-Administered Medications  Medication Dose Route Frequency Provider Last Rate Last Dose  . 0.9 %  sodium chloride infusion  100 mL Intravenous PRN Deterding, Fayrene Fearing, MD      . 0.9 %  sodium chloride infusion  100 mL Intravenous PRN Deterding, Fayrene Fearing, MD      . acetaminophen (TYLENOL) tablet 650 mg  650 mg Oral Q6H PRN Hillary Bow, DO   650 mg at 04/02/17 0805   Or  . acetaminophen (TYLENOL) suppository 650 mg  650 mg Rectal Q6H PRN Hillary Bow, DO      . alteplase (CATHFLO ACTIVASE) injection 2 mg  2 mg Intracatheter Once PRN Beryle Lathe, MD      . amLODipine (NORVASC) tablet 10 mg  10 mg Oral QHS Deterding, Fayrene Fearing, MD      . calcitRIOL (ROCALTROL) capsule 0.5 mcg  0.5 mcg Oral Alric Ran, MD      . Darbepoetin Alfa (ARANESP) injection 200 mcg  200 mcg Intravenous Q Mon-HD Beryle Lathe, MD   200 mcg at 04/02/17 2240  . ferric gluconate (NULECIT) 125 mg in sodium chloride 0.9 % 100 mL IVPB  125 mg Intravenous Q T,Th,Sa-HD Deterding, Fayrene Fearing, MD      . gabapentin (NEURONTIN) capsule 300 mg  300 mg Oral TID Hillary Bow, DO   300 mg at 04/03/17 1158  . heparin injection 1,000  Units  1,000 Units Dialysis PRN Beryle Lathe, MD   1,000 Units at 04/03/17 0035  . heparin injection 5,000 Units  5,000 Units Subcutaneous Q8H Calvert Cantor, MD   5,000 Units at 04/03/17 0550  . heparin injection 9,000 Units  100 Units/kg Dialysis PRN Beryle Lathe, MD   9,000 Units at 04/02/17 2125  . HYDROcodone-acetaminophen (NORCO/VICODIN) 5-325 MG per tablet 1 tablet  1 tablet Oral Q4H PRN Calvert Cantor, MD   1 tablet at 04/02/17 2240  . insulin aspart (novoLOG) injection 0-15 Units  0-15 Units Subcutaneous TID WC Hillary Bow, DO   2 Units at 04/03/17 1201  . insulin detemir (LEVEMIR) injection 32 Units  32 Units Subcutaneous BID Calvert Cantor, MD   32 Units at 04/03/17 1200  . labetalol (NORMODYNE) tablet 200 mg  200 mg Oral BID Calvert Cantor, MD   200 mg at 04/01/17 2158  . lanthanum (FOSRENOL) chewable tablet 1,000 mg  1,000 mg Oral TID WC Terrial Rhodes, MD   1,000 mg at 04/03/17 1159  . lidocaine (PF) (XYLOCAINE) 1 % injection 5 mL  5 mL Intradermal PRN Deterding, Fayrene Fearing, MD      . lidocaine (PF) (XYLOCAINE) 1 % injection    PRN Shick,  Casimiro Needle, MD   10 mL at 03/31/17 1115  . lidocaine-prilocaine (EMLA) cream 1 application  1 application Topical PRN Deterding, Fayrene Fearing, MD      . multivitamin (RENA-VIT) tablet 1 tablet  1 tablet Oral Vernetta Honey, MD   1 tablet at 04/02/17 2049  . ondansetron (ZOFRAN) tablet 4 mg  4 mg Oral Q6H PRN Hillary Bow, DO       Or  . ondansetron [facility]) injection 4 mg  4 mg Intravenous Q6H PRN Hillary Bow, DO      . pentafluoroprop-tetrafluoroeth (GEBAUERS) aerosol 1 application  1 application Topical PRN Deterding, Fayrene Fearing, MD      . simvastatin (ZOCOR) tablet 20 mg  20 mg Oral Daily Lyda Perone M, DO   20 mg at 04/01/17 1019    Pt meds include: Statin :Yes Betablocker: Yes ASA: No Other anticoagulants/antiplatelets: none  Past Medical History:  Diagnosis Date  . Chronic kidney disease    renal insufficiency (1.6-1.9  creatinine)  . Diabetes mellitus   . Hyperlipidemia   . Hypertension   . Increased frequency of headaches   . Thyroid disease    ? past problem    Past Surgical History:  Procedure Laterality Date  . CESAREAN SECTION     x 2  . CYSTECTOMY  2001   thyroid  . IR FLUORO GUIDE CV LINE RIGHT  03/31/2017  . IR US GUIDE VASC ACCESS RIGHT  03/31/2017    Social History Social History  Substance Use Topics  . Smoking status: Current Every Day Smoker    Years: 17.00    Types: Cigarettes  . Smokeless tobacco: Never Used     Comment: 2-3 cigarettes daily- started at 51 yrs old  . Alcohol use No    Family History Family History  Problem Relation Age of Onset  . Diabetes Mother   . Heart disease Mother   . Hyperlipidemia Mother   . Cancer Father        lung, non-smoker. deceased 20yr  . Diabetes Sister   . Heart disease Brother        MI @ 66    No Known Allergies   REVIEW OF SYSTEMS  General: [ ]  Weight loss, [ ]  Fever, [ ]  chills [x]  weight gain Neurologic: [ ]  Dizziness, [ ]  Blackouts, [ ]  Seizure [ ]  Stroke, [ ]  "Mini stroke", [ ]  Slurred speech, [ ]  Temporary blindness; [ ]  weakness in arms or legs, [ ]  Hoarseness [ ]  Dysphagia Cardiac: [ ]  Chest pain/pressure, [ ]  Shortness of breath at rest [ ]  Shortness of breath with exertion, [ ]  Atrial fibrillation or irregular heartbeat  Vascular: [ ]  Pain in legs with walking, [ ]  Pain in legs at rest, [ ]  Pain in legs at night,  [ ]  Non-healing ulcer, [ ]  Blood clot in vein/DVT,   Pulmonary: [ ]  Home oxygen, [ ]  Productive cough, [ ]  Coughing up blood, [ ]  Asthma,  [ ]  Wheezing [ ]  COPD Musculoskeletal:  [ ]  Arthritis, [ ]  Low back pain, [ ]  Joint pain Hematologic: [ ]  Easy Bruising, [ ]  Anemia; [ ]  Hepatitis Gastrointestinal: [ ]  Blood in stool, [ ]  Gastroesophageal Reflux/heartburn, Urinary: [x ] chronic Kidney disease, [x ] on HD - [ ]  MWF or [ ]  TTHS, [ ]  Burning with urination, [ ]  Difficulty urinating Skin: [ ]  Rashes,  [ ]  Wounds Psychological: [ ]  Anxiety, [ ]  Depression  Physical Examination Vitals:  04/03/17 0037 04/03/17 0123 04/03/17 0542 04/03/17 0900  BP: (!) 105/47 (!) 139/57 (!) 163/48 (!) 169/67  Pulse: 79 81 85 90  Resp: (!) 24 (!) 22 20 (!) 21  Temp: 98.2 F (36.8 C) 98.6 F (37 C) 99.3 F (37.4 C) 98.9 F (37.2 C)  TempSrc: Oral Oral Oral Oral  SpO2: 96% 95% 100% 98%  Weight: 282 lb 10.1 oz (128.2 kg)  282 lb 3 oz (128 kg)   Height:       Body mass index is 46.96 kg/m.  General:  WDWN in NAD HENT: WNL Eyes: Pupils equal Pulmonary: normal non-labored breathing , without Rales, rhonchi,  wheezing Cardiac: RRR, without  Murmurs, rubs or gallops; No carotid bruits Abdomen: soft, NT, no masses Skin: no rashes, ulcers noted;  no Gangrene , no cellulitis; no open wounds;   Vascular Exam/Pulses:palpable radial pulses B 2+   Musculoskeletal: no muscle wasting or atrophy; positive LE  edema  Neurologic: A&O X 3; Appropriate Affect ;  SENSATION: normal; MOTOR FUNCTION: 5/5 Symmetric, global left LE pain peripheral neuropathy  Speech is fluent/normal   Significant Diagnostic Studies: CBC Lab Results  Component Value Date   WBC 12.6 (H) 04/02/2017   HGB 7.1 (L) 04/02/2017   HCT 21.9 (L) 04/02/2017   MCV 89.8 04/02/2017   PLT 287 04/02/2017    BMET    Component Value Date/Time   NA 137 04/02/2017 2100   K 4.7 04/02/2017 2100   CL 105 04/02/2017 2100   CO2 19 (L) 04/02/2017 2100   GLUCOSE 118 (H) 04/02/2017 2100   BUN 77 (H) 04/02/2017 2100   CREATININE 12.20 (H) 04/02/2017 2100   CREATININE 1.40 (H) 07/21/2011 1429   CALCIUM 8.1 (L) 04/02/2017 2100   GFRNONAA 3 (L) 04/02/2017 2100   GFRAA 4 (L) 04/02/2017 2100   Estimated Creatinine Clearance: 7.6 mL/min (A) (by C-G formula based on SCr of 12.2 mg/dL (H)).  COAG No results found for: INR, PROTIME   Non-Invasive Vascular Imaging:    +-----------------+--------+-----+--------+ Right Cephalic   DiameterDepthFindings +-----------------+--------+-----+--------+ Prox upper arm   4.00 13.40     +-----------------+--------+-----+--------+ Mid upper arm   4.80 6.60      +-----------------+--------+-----+--------+ Dist upper arm   5.50 3.50      +-----------------+--------+-----+--------+ Antecubital fossa 4.10 2.60      +-----------------+--------+-----+--------+ Prox forearm    5.00 2.70      +-----------------+--------+-----+--------+ Mid forearm    3.40 3.80      +-----------------+--------+-----+--------+ Dist forearm    2.90 3.20      +-----------------+--------+-----+--------+ Wrist       2.20 3.30      +-----------------+--------+-----+--------+  +-----------------+--------+-----+--------+ Right Basilic  DiameterDepthFindings +-----------------+--------+-----+--------+ Mid upper arm   4.30 18.40     +-----------------+--------+-----+--------+ Dist upper arm   3.90 7.70      +-----------------+--------+-----+--------+ Antecubital fossa 2.20 2.70      +-----------------+--------+-----+--------+ Prox forearm    4.10 4.80      +-----------------+--------+-----+--------+  Basilic Mid forearm diameter 5.2WU3.3mm , depth 9.6 mm. +-----------------+--------+-----+----------+ Left Cephalic  DiameterDepth Findings  +-----------------+--------+-----+----------+ Prox upper arm   2.80 2.90       +-----------------+--------+-----+----------+ Mid upper arm   3.70 4.30       +-----------------+--------+-----+----------+ Dist upper arm   3.20 3.60 Thrombosed +-----------------+--------+-----+----------+ Antecubital fossa 2.00 4.20 Thrombosed +-----------------+--------+-----+----------+ Prox forearm    3.20 4.10  Thrombosed +-----------------+--------+-----+----------+ Mid forearm    3.60 6.80       +-----------------+--------+-----+----------+ Wrist  1.90 2.50       +-----------------+--------+-----+----------+  +-----------------+--------+-----+---------+ Left Basilic   DiameterDepthFindings  +-----------------+--------+-----+---------+ Mid upper arm   3.70 10.70      +-----------------+--------+-----+---------+ Dist upper arm   3.50 7.40       +-----------------+--------+-----+---------+ Antecubital fossa 3.50 6.00       +-----------------+--------+-----+---------+ Prox forearm    3.10 5.60 branching +-----------------+--------+-----+---------+  Left Basilic - Mid forearm diameter 2.8 mm depth 4.5 mm Left Basilic Wrist diameter 2.0 mm depth 3.8 mm  ASSESSMENT/PLAN:  CKD/ESRD currently on HD via right tunneled catheter. She is right handed.  We will plan left AV fistula the radiocephalic maybe a considerable fistula to start with.     Teresa [facility] 04/03/2017 1:04 PM

## 2017-04-03 NOTE — Procedures (Signed)
I was present at this session.  I have reviewed the session itself and made appropriate changes. Low flows, 3rd tx, to get 3 L. Teresa Dalton L 10/23/20183:13 PM

## 2017-04-03 NOTE — Progress Notes (Signed)
Subjective: Interval History: has complaints legs still sore.  Objective: Vital signs in last 24 hours: Temp:  [98.1 F (36.7 C)-99.3 F (37.4 C)] 99.3 F (37.4 C) (10/23 0542) Pulse Rate:  [74-85] 85 (10/23 0542) Resp:  [17-29] 20 (10/23 0542) BP: (105-173)/(47-78) 163/48 (10/23 0542) SpO2:  [95 %-100 %] 100 % (10/23 0542) Weight:  [128 kg (282 lb 3 oz)-132.2 kg (291 lb 7.2 oz)] 128 kg (282 lb 3 oz) (10/23 0542) Weight change: 42.6 kg (93 lb 14.7 oz)  Intake/Output from previous day: 10/22 0701 - 10/23 0700 In: 480 [P.O.:480] Out: 4350 [Urine:350] Intake/Output this shift: No intake/output data recorded.  General appearance: alert, cooperative, no distress and moderately obese Neck: RIJ PC Resp: diminished breath sounds bilaterally and rales bibasilar Cardio: S1, S2 normal and systolic murmur: systolic ejection 2/6, decrescendo at 2nd left intercostal space GI: obese, pos bs, liver down 5 cm Extremities: edema 2+  Lab Results:  Recent Labs  04/01/17 0046 04/02/17 2154  WBC 11.6* 12.6*  HGB 7.9* 7.1*  HCT 24.6* 21.9*  PLT 267 287   BMET:  Recent Labs  04/01/17 0046 04/02/17 2100  NA 135 137  K 5.1 4.7  CL 104 105  CO2 18* 19*  GLUCOSE 79 118*  BUN 68* 77*  CREATININE 10.73* 12.20*  CALCIUM 8.1* 8.1*   No results for input(s): PTH in the last 72 hours. Iron Studies: No results for input(s): IRON, TIBC, TRANSFERRIN, FERRITIN in the last 72 hours.  Studies/Results: No results found.  I have reviewed the patient's current medications.  Assessment/Plan: 1 ESRD new , will do 3rd HD, lower vol and solute.   Perm access pending 2 Anemia esa/Fe 3 HPTH use vit D 4 HTN meds and lower vol, lower meds as less vol 5 DM controlled 6 NONADHERENCE  7 Obesity 8 ^ Lipids P HD, access, esa, vit D, Fe   LOS: 4 days   Teresa Dalton L 04/03/2017,7:50 AM

## 2017-04-04 DIAGNOSIS — N186 End stage renal disease: Secondary | ICD-10-CM

## 2017-04-04 LAB — CBC
HEMATOCRIT: 24.6 % — AB (ref 36.0–46.0)
Hemoglobin: 7.8 g/dL — ABNORMAL LOW (ref 12.0–15.0)
MCH: 28.9 pg (ref 26.0–34.0)
MCHC: 31.7 g/dL (ref 30.0–36.0)
MCV: 91.1 fL (ref 78.0–100.0)
PLATELETS: 315 10*3/uL (ref 150–400)
RBC: 2.7 MIL/uL — AB (ref 3.87–5.11)
RDW: 14.4 % (ref 11.5–15.5)
WBC: 15.1 10*3/uL — ABNORMAL HIGH (ref 4.0–10.5)

## 2017-04-04 LAB — GLUCOSE, CAPILLARY
GLUCOSE-CAPILLARY: 133 mg/dL — AB (ref 65–99)
Glucose-Capillary: 105 mg/dL — ABNORMAL HIGH (ref 65–99)
Glucose-Capillary: 146 mg/dL — ABNORMAL HIGH (ref 65–99)
Glucose-Capillary: 149 mg/dL — ABNORMAL HIGH (ref 65–99)

## 2017-04-04 LAB — URIC ACID: URIC ACID, SERUM: 5.4 mg/dL (ref 2.3–6.6)

## 2017-04-04 MED ORDER — CEFAZOLIN SODIUM-DEXTROSE 1-4 GM/50ML-% IV SOLN
1.0000 g | INTRAVENOUS | Status: AC
  Administered 2017-04-05: 1 g via INTRAVENOUS
  Filled 2017-04-04 (×2): qty 50

## 2017-04-04 MED ORDER — METHOCARBAMOL 500 MG PO TABS
500.0000 mg | ORAL_TABLET | Freq: Three times a day (TID) | ORAL | Status: DC
  Administered 2017-04-04 – 2017-04-11 (×17): 500 mg via ORAL
  Filled 2017-04-04 (×18): qty 1

## 2017-04-04 MED ORDER — GABAPENTIN 300 MG PO CAPS: 300.0000 mg | ORAL_CAPSULE | Freq: Every day | ORAL | Status: DC

## 2017-04-04 NOTE — Progress Notes (Signed)
PROGRESS NOTE    Teresa Dalton  BJY:[id number] DOB: 28-Dec-1965 DOA: 03/31/2017 PCP: Patient, No Pcp Per   Brief Narrative: 51 year old female with history of hypertension, type 2 diabetes, chronic kidney disease stage IV in 2015 who hasn't seen nephrology since presented with bilateral foot pain associated with lower extremity edema.  Assessment & Plan:  # New onset ESRD: -patient has been receiving hemodialysis Tunneled catheter, tolerating well. Plan for AV fistula by vascular surgeon. -Monitor BMP and electrolytes. -discussed with the social worker and care management team regarding management of outpatient dialysis.  #Hypertension: Monitor blood pressure. Continue amlodipine, labetalol.  #Peripheral neuropathy pain: currently on high dose of Neurontin.Since patient is ESRD who will adjust the dose to 300 mg daily at bedtime. Add Robaxin.Doppler negative for DVT.  #Anemia of chronic kidney disease: Continue IV iron and PSA during dialysis.  #Type 2 diabetes: Continue current insulin regimen. Monitor blood sugar level.blood sugar level acceptable.  #Obesity: Dietary adjustment and healthy lipase  #Hyperlipidemia: Continue statin   #mild temperature; Patient is afebrile now. Chest x-ray unremarkable. Monitor temperature, Tylenol as needed.  DVT prophylaxis:heparin subcutaneous Code Status:full code Family Communication:o family at bedside Disposition Plan:currently admitted    Consultants:   Nephrologist  Vascular surgeon  Procedures:tunneled catheter placement Antimicrobials:none  Subjective: Reports bilateral lower extremity and feet pain Denied nausea vomiting, chest pain, shortness of breath  Objective: Vitals:   04/03/17 2147 04/04/17 0451 04/04/17 0525 04/04/17 0731  BP: (!) 167/57 (!) 155/76  (!) 158/61  Pulse: (!) 101 83  88  Resp: (!) 22 20  20   Temp: 100 F (37.8 C) 99.4 F (37.4 C)  99.7 F (37.6 C)  TempSrc: Oral Oral  Oral  SpO2: 96%  94%  95%  Weight:   121.8 kg (268 lb 8.3 oz)   Height:        Intake/Output Summary (Last 24 hours) at 04/04/17 1349 Last data filed at 04/04/17 0451  Gross per 24 hour  Intake              230 ml  Output             3600 ml  Net            -3370 ml   Filed Weights   04/03/17 1430 04/03/17 1744 04/04/17 0525  Weight: 127.5 kg (281 lb 1.4 oz) 124 kg (273 lb 5.9 oz) 121.8 kg (268 lb 8.3 oz)    Examination:  General exam: Appears calm and comfortable  Respiratory system: Clear to auscultation. Respiratory effort normal. No wheezing or crackle Cardiovascular system: S1 & S2 heard, RRR.   Gastrointestinal system: Abdomen is nondistended, soft and nontender. Normal bowel sounds heard. Central nervous system: Alert and oriented. No focal neurological deficits. Extremities: b/l LE pitting edema Skin: No rashes, lesions or ulcers Psychiatry: Judgement and insight appear normal. Mood & affect appropriate.     Data Reviewed: I have personally reviewed following labs and imaging studies  CBC:  Recent Labs Lab 04/06/2017 1937  03/31/17 1801 04/01/17 0046 04/02/17 2154 04/03/17 1536 04/04/17 0206  WBC 12.2*  < > 12.2* 11.6* 12.6* 14.5* 15.1*  NEUTROABS 9.1*  --   --  9.0*  --   --   --   HGB 8.2*  < > 7.6* 7.9* 7.1* 7.6* 7.8*  HCT 26.3*  < > 24.3* 24.6* 21.9* 23.7* 24.6*  MCV 92.9  < > 92.7 92.1 89.8 91.2 91.1  PLT 281  < > 301 267  287 310 315  < > = values in this interval not displayed. Basic Metabolic Panel:  Recent Labs Lab 03/31/17 0215 03/31/17 1758 04/01/17 0046 04/02/17 2100 04/03/17 1536  NA 137  137 137 135 137 138  K 5.7*  5.7* 7.2* 5.1 4.7 4.5  CL 110  109 108 104 105 103  CO2 15*  16* 16* 18* 19* 22  GLUCOSE 147*  147* 109* 79 118* 150*  BUN 89*  90* 92* 68* 77* 49*  CREATININE 13.13*  12.99* 13.13* 10.73* 12.20* 9.22*  CALCIUM 8.1*  7.9* 7.9* 8.1* 8.1* 8.5*  PHOS 8.8* 8.6*  --  7.6* 5.5*   GFR: Estimated Creatinine Clearance: 9.8 mL/min (A)  (by C-G formula based on SCr of 9.22 mg/dL (H)). Liver Function Tests:  Recent Labs Lab 03/24/2017 1937 03/31/17 0215 03/31/17 1758 04/02/17 2100 04/03/17 1536  AST 11*  --   --   --   --   ALT 11*  --   --   --   --   ALKPHOS 118  --   --   --   --   BILITOT 0.8  --   --   --   --   PROT 6.8  --   --   --   --   ALBUMIN 2.8* 2.5* 2.6* 2.4* 2.5*   No results for input(s): LIPASE, AMYLASE in the last 168 hours. No results for input(s): AMMONIA in the last 168 hours. Coagulation Profile: No results for input(s): INR, PROTIME in the last 168 hours. Cardiac Enzymes: No results for input(s): CKTOTAL, CKMB, CKMBINDEX, TROPONINI in the last 168 hours. BNP (last 3 results) No results for input(s): PROBNP in the last 8760 hours. HbA1C: No results for input(s): HGBA1C in the last 72 hours. CBG:  Recent Labs Lab 04/03/17 1056 04/03/17 1905 04/03/17 2155 04/04/17 0807 04/04/17 1151  GLUCAP 127* 104* 152* 105* 133*   Lipid Profile: No results for input(s): CHOL, HDL, LDLCALC, TRIG, CHOLHDL, LDLDIRECT in the last 72 hours. Thyroid Function Tests: No results for input(s): TSH, T4TOTAL, FREET4, T3FREE, THYROIDAB in the last 72 hours. Anemia Panel: No results for input(s): VITAMINB12, FOLATE, FERRITIN, TIBC, IRON, RETICCTPCT in the last 72 hours. Sepsis Labs: No results for input(s): PROCALCITON, LATICACIDVEN in the last 168 hours.  Recent Results (from the past 240 hour(s))  MRSA PCR Screening     Status: None   Collection Time: 04/01/17  2:45 PM  Result Value Ref Range Status   MRSA by PCR NEGATIVE NEGATIVE Final    Comment:        The GeneXpert MRSA Assay (FDA approved for NASAL specimens only), is one component of a comprehensive MRSA colonization surveillance program. It is not intended to diagnose MRSA infection nor to guide or monitor treatment for MRSA infections.   Culture, blood (Routine X 2) w Reflex to ID Panel     Status: None (Preliminary result)    Collection Time: 04/03/17  7:01 PM  Result Value Ref Range Status   Specimen Description BLOOD LEFT WRIST  Final   Special Requests   Final    BOTTLES DRAWN AEROBIC AND ANAEROBIC Blood Culture adequate volume   Culture NO GROWTH < 24 HOURS  Final   Report Status PENDING  Incomplete  Culture, blood (Routine X 2) w Reflex to ID Panel     Status: None (Preliminary result)   Collection Time: 04/03/17  7:01 PM  Result Value Ref Range Status   Specimen Description BLOOD  RIGHT ANTECUBITAL  Final   Special Requests   Final    BOTTLES DRAWN AEROBIC AND ANAEROBIC Blood Culture results may not be optimal due to an excessive volume of blood received in culture bottles   Culture NO GROWTH < 24 HOURS  Final   Report Status PENDING  Incomplete         Radiology Studies: Dg Chest 2 View  Result Date: 04/03/2017 CLINICAL DATA:  Fever.  Smoker.  End-stage renal disease. EXAM: CHEST  2 VIEW COMPARISON:  Apr 02, 2017. FINDINGS: Interval right jugular catheter with its tip at the superior cavoatrial junction. No pneumothorax. Stable enlarged cardiac silhouette and grossly clear lungs. Thoracic spine degenerative changes. IMPRESSION: No acute abnormality.  Stable cardiomegaly. Electronically Signed   By: Beckie Salts M.D.   On: 04/03/2017 21:19        Scheduled Meds: . amLODipine  10 mg Oral QHS  . calcitRIOL  0.5 mcg Oral QODAY  . darbepoetin (ARANESP) injection - DIALYSIS  200 mcg Intravenous Q Mon-HD  . gabapentin  300 mg Oral TID  . heparin subcutaneous  5,000 Units Subcutaneous Q8H  . insulin aspart  0-15 Units Subcutaneous TID WC  . insulin detemir  32 Units Subcutaneous BID  . labetalol  200 mg Oral BID  . lanthanum  1,000 mg Oral TID WC  . methocarbamol  500 mg Oral TID  . multivitamin  1 tablet Oral QHS  . simvastatin  20 mg Oral Daily   Continuous Infusions: . [START ON 03/17/2017]  ceFAZolin (ANCEF) IV    . ferric gluconate (FERRLECIT/NULECIT) IV Stopped (04/03/17 1744)     LOS:  5 days    Nazareth Kirk Jaynie Collins, MD [facility] Pager [phone]  If 7PM-7AM, please contact night-coverage [email or web address] Password [email or web address] 04/04/2017, 1:49 PM

## 2017-04-04 NOTE — Progress Notes (Signed)
S: Continues to report neuropathic leg pain today. Concerned about having a work note when she is discharge. I reassured her that this would be no problem.   O:BP (!) 158/61 (BP Location: Left Arm)   Pulse 88   Temp 99.7 F (37.6 C) (Oral)   Resp 20   Ht 5\' 5"  (1.651 m)   Wt 268 lb 8.3 oz (121.8 kg)   LMP 03/26/2017   SpO2 95%   BMI 44.68 kg/m   Intake/Output Summary (Last 24 hours) at 04/04/17 1116 Last data filed at 04/04/17 0451  Gross per 24 hour  Intake              230 ml  Output             3600 ml  Net            -3370 ml   Intake/Output: I/O last 3 completed shifts: In: 230 [P.O.:120; IV Piggyback:110] Out: 7900 [Urine:900; Other:7000]  Intake/Output this shift:  No intake/output data recorded. Weight change: -10 lb 5.8 oz (-4.7 kg) Gen well-appearing, no acute distress, obese CVS: RRR Gr2/6 M Resp: clear to auscultation bilateral decreased bs Abd: soft, non tender , Obese, liver down 6 cm Ext: 1+ bilateral lower extremity edema, temporary HD catheter in R IJ    Recent Labs Lab 03/28/2017 1937 03/31/17 0215 03/31/17 1758 04/01/17 0046 04/02/17 2100 04/03/17 1536  NA 136 137  137 137 135 137 138  K 6.1* 5.7*  5.7* 7.2* 5.1 4.7 4.5  CL 110 110  109 108 104 105 103  CO2 13* 15*  16* 16* 18* 19* 22  GLUCOSE 166* 147*  147* 109* 79 118* 150*  BUN 89* 89*  90* 92* 68* 77* 49*  CREATININE 13.26* 13.13*  12.99* 13.13* 10.73* 12.20* 9.22*  ALBUMIN 2.8* 2.5* 2.6*  --  2.4* 2.5*  CALCIUM 8.1* 8.1*  7.9* 7.9* 8.1* 8.1* 8.5*  PHOS  --  8.8* 8.6*  --  7.6* 5.5*  AST 11*  --   --   --   --   --   ALT 11*  --   --   --   --   --    Liver Function Tests:  Recent Labs Lab 03/19/2017 1937  03/31/17 1758 04/02/17 2100 04/03/17 1536  AST 11*  --   --   --   --   ALT 11*  --   --   --   --   ALKPHOS 118  --   --   --   --   BILITOT 0.8  --   --   --   --   PROT 6.8  --   --   --   --   ALBUMIN 2.8*  < > 2.6* 2.4* 2.5*  < > = values in this interval not  displayed. No results for input(s): LIPASE, AMYLASE in the last 168 hours. No results for input(s): AMMONIA in the last 168 hours. CBC:  Recent Labs Lab 04/08/2017 1937  03/31/17 1801 04/01/17 0046 04/02/17 2154 04/03/17 1536 04/04/17 0206  WBC 12.2*  < > 12.2* 11.6* 12.6* 14.5* 15.1*  NEUTROABS 9.1*  --   --  9.0*  --   --   --   HGB 8.2*  < > 7.6* 7.9* 7.1* 7.6* 7.8*  HCT 26.3*  < > 24.3* 24.6* 21.9* 23.7* 24.6*  MCV 92.9  < > 92.7 92.1 89.8 91.2 91.1  PLT 281  < >  301 267 287 310 315  < > = values in this interval not displayed. Cardiac Enzymes: No results for input(s): CKTOTAL, CKMB, CKMBINDEX, TROPONINI in the last 168 hours. CBG:  Recent Labs Lab 04/03/17 0817 04/03/17 1056 04/03/17 1905 04/03/17 2155 04/04/17 0807  GLUCAP 96 127* 104* 152* 105*    Iron Studies: No results for input(s): IRON, TIBC, TRANSFERRIN, FERRITIN in the last 72 hours. Studies/Results: Dg Chest 2 View  Result Date: 04/03/2017 CLINICAL DATA:  Fever.  Smoker.  End-stage renal disease. EXAM: CHEST  2 VIEW COMPARISON:  04/12/2017. FINDINGS: Interval right jugular catheter with its tip at the superior cavoatrial junction. No pneumothorax. Stable enlarged cardiac silhouette and grossly clear lungs. Thoracic spine degenerative changes. IMPRESSION: No acute abnormality.  Stable cardiomegaly. Electronically Signed   By: Beckie SaltsSteven  Reid M.D.   On: 04/03/2017 21:19   . amLODipine  10 mg Oral QHS  . calcitRIOL  0.5 mcg Oral QODAY  . darbepoetin (ARANESP) injection - DIALYSIS  200 mcg Intravenous Q Mon-HD  . gabapentin  300 mg Oral TID  . heparin subcutaneous  5,000 Units Subcutaneous Q8H  . insulin aspart  0-15 Units Subcutaneous TID WC  . insulin detemir  32 Units Subcutaneous BID  . labetalol  200 mg Oral BID  . lanthanum  1,000 mg Oral TID WC  . methocarbamol  500 mg Oral TID  . multivitamin  1 tablet Oral QHS  . simvastatin  20 mg Oral Daily    BMET    Component Value Date/Time   NA 138  04/03/2017 1536   K 4.5 04/03/2017 1536   CL 103 04/03/2017 1536   CO2 22 04/03/2017 1536   GLUCOSE 150 (H) 04/03/2017 1536   BUN 49 (H) 04/03/2017 1536   CREATININE 9.22 (H) 04/03/2017 1536   CREATININE 1.40 (H) 07/21/2011 1429   CALCIUM 8.5 (L) 04/03/2017 1536   GFRNONAA 4 (L) 04/03/2017 1536   GFRAA 5 (L) 04/03/2017 1536   CBC    Component Value Date/Time   WBC 15.1 (H) 04/04/2017 0206   RBC 2.70 (L) 04/04/2017 0206   HGB 7.8 (L) 04/04/2017 0206   HCT 24.6 (L) 04/04/2017 0206   PLT 315 04/04/2017 0206   MCV 91.1 04/04/2017 0206   MCH 28.9 04/04/2017 0206   MCHC 31.7 04/04/2017 0206   RDW 14.4 04/04/2017 0206   LYMPHSABS 1.4 04/01/2017 0046   MONOABS 0.7 04/01/2017 0046   EOSABS 0.5 04/01/2017 0046   BASOSABS 0.0 04/01/2017 0046     Assessment/Plan:  1. ESRD - Went for 3rd initiation HD session yesterday. First stage of basilic vein fistula planned for tomorrow, dialysis tomorrow . Working on [employer] process.  Does not have address.  Will lower vol and solute at HD 2. Anemia - IV ferric gluconate with HD and ESA  3. Mineral metabolic disorder - calcitriol and lanthanum  4. HTN- Blood pressure improving with HD and Amlodipine 10 mg qHS  5. Neuropathic pain - receiving gabapentin 300 mg TID with caution as she is predisposed to increased side effects ( neurologic and tremor ) with ESRD  6. DM - controlled, per primary  7. Non adherance - will need continued encouragement  8. Obesity - will need continued encouragement  9. Hyperlipidemia - continue simvastatin    I have seen and examined this patient and agree with the plan of care seen, examined, eval.  Discussed course and plans with patient, resident. .  Latesa Fratto L 04/04/2017, 11:48 AM   Eulah PontNina Blum,  MD

## 2017-04-04 NOTE — Progress Notes (Signed)
Patient provided [place] card [employer] [employer] [place] # [id number] GROUP [id number] PLAN NUMBERS [id number] EFFECTIVE 08/11/2015. Information placed in [facility] note as well for Financial Counselors to verify insurance is active.

## 2017-04-04 NOTE — Progress Notes (Signed)
Spoke w Maud DeedShiela in HD, verified that clip initiated yesterday and is in process.

## 2017-04-05 ENCOUNTER — Encounter (HOSPITAL_COMMUNITY): Admission: EM | Disposition: E | Payer: Self-pay | Source: Home / Self Care | Attending: Nephrology

## 2017-04-05 ENCOUNTER — Inpatient Hospital Stay (HOSPITAL_COMMUNITY): Payer: BLUE CROSS/BLUE SHIELD | Admitting: Anesthesiology

## 2017-04-05 ENCOUNTER — Encounter (HOSPITAL_COMMUNITY): Payer: Self-pay | Admitting: *Deleted

## 2017-04-05 ENCOUNTER — Inpatient Hospital Stay (HOSPITAL_COMMUNITY): Payer: BLUE CROSS/BLUE SHIELD

## 2017-04-05 DIAGNOSIS — M79606 Pain in leg, unspecified: Secondary | ICD-10-CM

## 2017-04-05 HISTORY — PX: AV FISTULA PLACEMENT: SHX1204

## 2017-04-05 LAB — CBC
HCT: 25.1 % — ABNORMAL LOW (ref 36.0–46.0)
HEMOGLOBIN: 7.6 g/dL — AB (ref 12.0–15.0)
MCH: 28.1 pg (ref 26.0–34.0)
MCHC: 30.3 g/dL (ref 30.0–36.0)
MCV: 93 fL (ref 78.0–100.0)
Platelets: 324 10*3/uL (ref 150–400)
RBC: 2.7 MIL/uL — ABNORMAL LOW (ref 3.87–5.11)
RDW: 14.5 % (ref 11.5–15.5)
WBC: 15.8 10*3/uL — ABNORMAL HIGH (ref 4.0–10.5)

## 2017-04-05 LAB — SURGICAL PCR SCREEN
MRSA, PCR: NEGATIVE
STAPHYLOCOCCUS AUREUS: NEGATIVE

## 2017-04-05 LAB — BASIC METABOLIC PANEL
ANION GAP: 15 (ref 5–15)
BUN: 44 mg/dL — ABNORMAL HIGH (ref 6–20)
CALCIUM: 9.3 mg/dL (ref 8.9–10.3)
CO2: 24 mmol/L (ref 22–32)
Chloride: 103 mmol/L (ref 101–111)
Creatinine, Ser: 9.17 mg/dL — ABNORMAL HIGH (ref 0.44–1.00)
GFR, EST AFRICAN AMERICAN: 5 mL/min — AB (ref >60)
GFR, EST NON AFRICAN AMERICAN: 4 mL/min — AB (ref >60)
GLUCOSE: 96 mg/dL (ref 65–99)
Potassium: 4.4 mmol/L (ref 3.5–5.1)
Sodium: 142 mmol/L (ref 135–145)

## 2017-04-05 LAB — GLUCOSE, CAPILLARY
GLUCOSE-CAPILLARY: 93 mg/dL (ref 65–99)
Glucose-Capillary: 105 mg/dL — ABNORMAL HIGH (ref 65–99)
Glucose-Capillary: 92 mg/dL (ref 65–99)
Glucose-Capillary: 121 mg/dL — ABNORMAL HIGH (ref 65–99)

## 2017-04-05 LAB — PROTIME-INR
INR: 1.21
PROTHROMBIN TIME: 15.2 s (ref 11.4–15.2)

## 2017-04-05 LAB — HCG, QUANTITATIVE, PREGNANCY: hCG, Beta Chain, Quant, S: 6 m[IU]/mL — ABNORMAL HIGH (ref <5)

## 2017-04-05 SURGERY — ARTERIOVENOUS (AV) FISTULA CREATION
Anesthesia: Monitor Anesthesia Care | Site: Arm Upper | Laterality: Left

## 2017-04-05 MED ORDER — HYDROCODONE-ACETAMINOPHEN 5-325 MG PO TABS
1.0000 | ORAL_TABLET | ORAL | Status: DC | PRN
  Administered 2017-04-05 – 2017-04-09 (×10): 1 via ORAL
  Filled 2017-04-05 (×10): qty 1

## 2017-04-05 MED ORDER — 0.9 % SODIUM CHLORIDE (POUR BTL) OPTIME
TOPICAL | Status: DC | PRN
  Administered 2017-04-05: 1000 mL

## 2017-04-05 MED ORDER — MORPHINE SULFATE (PF) 4 MG/ML IV SOLN
INTRAVENOUS | Status: AC
  Administered 2017-04-05: 1 mg
  Filled 2017-04-05: qty 1

## 2017-04-05 MED ORDER — MORPHINE SULFATE (PF) 2 MG/ML IV SOLN
1.0000 mg | Freq: Once | INTRAVENOUS | Status: AC
  Administered 2017-04-05: 1 mg via INTRAVENOUS
  Filled 2017-04-05: qty 1

## 2017-04-05 MED ORDER — PROPOFOL 10 MG/ML IV BOLUS
INTRAVENOUS | Status: AC
  Filled 2017-04-05: qty 40

## 2017-04-05 MED ORDER — PROPOFOL 500 MG/50ML IV EMUL
INTRAVENOUS | Status: AC
  Filled 2017-04-05: qty 100

## 2017-04-05 MED ORDER — SODIUM CHLORIDE 0.9 % IV SOLN
INTRAVENOUS | Status: DC
  Administered 2017-04-05 (×2): via INTRAVENOUS

## 2017-04-05 MED ORDER — FENTANYL CITRATE (PF) 250 MCG/5ML IJ SOLN
INTRAMUSCULAR | Status: AC
  Filled 2017-04-05: qty 5

## 2017-04-05 MED ORDER — PROPOFOL 500 MG/50ML IV EMUL
INTRAVENOUS | Status: DC | PRN
  Administered 2017-04-05: 25 ug/kg/min via INTRAVENOUS

## 2017-04-05 MED ORDER — FENTANYL CITRATE (PF) 100 MCG/2ML IJ SOLN
25.0000 ug | INTRAMUSCULAR | Status: DC | PRN
  Administered 2017-04-05: 25 ug via INTRAVENOUS
  Administered 2017-04-05: 50 ug via INTRAVENOUS
  Administered 2017-04-05: 25 ug via INTRAVENOUS

## 2017-04-05 MED ORDER — GABAPENTIN 300 MG PO CAPS: 300.0000 mg | ORAL_CAPSULE | Freq: Two times a day (BID) | ORAL | Status: DC

## 2017-04-05 MED ORDER — MORPHINE SULFATE (PF) 2 MG/ML IV SOLN: 1.0000 mg | Freq: Once | INTRAVENOUS | Status: AC

## 2017-04-05 MED ORDER — LIDOCAINE-EPINEPHRINE (PF) 1 %-1:200000 IJ SOLN
INTRAMUSCULAR | Status: AC
  Filled 2017-04-05: qty 30

## 2017-04-05 MED ORDER — SODIUM CHLORIDE 0.9 % IV SOLN
INTRAVENOUS | Status: DC | PRN
  Administered 2017-04-05: 10:00:00 500 mL

## 2017-04-05 MED ORDER — HYDROCODONE-ACETAMINOPHEN 5-325 MG PO TABS
ORAL_TABLET | ORAL | Status: AC
  Filled 2017-04-05: qty 1

## 2017-04-05 MED ORDER — MIDAZOLAM HCL 5 MG/5ML IJ SOLN
INTRAMUSCULAR | Status: DC | PRN
  Administered 2017-04-05 (×2): 0.5 mg via INTRAVENOUS

## 2017-04-05 MED ORDER — ONDANSETRON HCL 4 MG/2ML IJ SOLN
INTRAMUSCULAR | Status: DC | PRN
  Administered 2017-04-05: 4 mg via INTRAVENOUS

## 2017-04-05 MED ORDER — FENTANYL CITRATE (PF) 100 MCG/2ML IJ SOLN
INTRAMUSCULAR | Status: AC
  Administered 2017-04-05: 25 ug via INTRAVENOUS
  Filled 2017-04-05: qty 2

## 2017-04-05 MED ORDER — FENTANYL CITRATE (PF) 100 MCG/2ML IJ SOLN
INTRAMUSCULAR | Status: DC | PRN
  Administered 2017-04-05 (×4): 25 ug via INTRAVENOUS

## 2017-04-05 MED ORDER — MIDAZOLAM HCL 2 MG/2ML IJ SOLN
INTRAMUSCULAR | Status: AC
  Filled 2017-04-05: qty 2

## 2017-04-05 MED ORDER — METOCLOPRAMIDE HCL 5 MG/ML IJ SOLN: 10.0000 mg | Freq: Once | INTRAMUSCULAR | Status: DC | PRN

## 2017-04-05 MED ORDER — METHOCARBAMOL 500 MG PO TABS
ORAL_TABLET | ORAL | Status: AC
  Filled 2017-04-05: qty 1

## 2017-04-05 MED ORDER — LIDOCAINE HCL (CARDIAC) 20 MG/ML IV SOLN
INTRAVENOUS | Status: DC | PRN
  Administered 2017-04-05: 20 mg via INTRATRACHEAL

## 2017-04-05 MED ORDER — FENTANYL CITRATE (PF) 100 MCG/2ML IJ SOLN
50.0000 ug | Freq: Once | INTRAMUSCULAR | Status: AC
  Administered 2017-04-05: 50 ug via INTRAVENOUS
  Filled 2017-04-05: qty 1

## 2017-04-05 MED ORDER — GABAPENTIN 300 MG PO CAPS
300.0000 mg | ORAL_CAPSULE | Freq: Every day | ORAL | Status: DC
  Administered 2017-04-06 – 2017-04-16 (×10): 300 mg via ORAL
  Filled 2017-04-05 (×10): qty 1

## 2017-04-05 MED ORDER — LIDOCAINE-EPINEPHRINE (PF) 1 %-1:200000 IJ SOLN
INTRAMUSCULAR | Status: DC | PRN
  Administered 2017-04-05: 20 mL

## 2017-04-05 MED ORDER — LACTATED RINGERS IV SOLN: INTRAVENOUS | Status: DC

## 2017-04-05 MED ORDER — MEPERIDINE HCL 25 MG/ML IJ SOLN: 6.2500 mg | INTRAMUSCULAR | Status: DC | PRN

## 2017-04-05 MED ORDER — PROPOFOL 10 MG/ML IV BOLUS
INTRAVENOUS | Status: DC | PRN
  Administered 2017-04-05 (×3): 20 mg via INTRAVENOUS

## 2017-04-05 MED ORDER — FENTANYL CITRATE (PF) 100 MCG/2ML IJ SOLN
INTRAMUSCULAR | Status: AC
  Administered 2017-04-05: 50 ug via INTRAVENOUS
  Filled 2017-04-05: qty 2

## 2017-04-05 SURGICAL SUPPLY — 32 items
ARMBAND PINK RESTRICT EXTREMIT (MISCELLANEOUS) ×3 IMPLANT
CANISTER SUCT 3000ML PPV (MISCELLANEOUS) ×3 IMPLANT
CATH EMB 3FR 40CM (CATHETERS) ×3 IMPLANT
CLIP VESOCCLUDE MED 6/CT (CLIP) ×3 IMPLANT
CLIP VESOCCLUDE SM WIDE 6/CT (CLIP) ×3 IMPLANT
COVER PROBE W GEL 5X96 (DRAPES) IMPLANT
DECANTER SPIKE VIAL GLASS SM (MISCELLANEOUS) ×3 IMPLANT
DERMABOND ADVANCED (GAUZE/BANDAGES/DRESSINGS) ×2
DERMABOND ADVANCED .7 DNX12 (GAUZE/BANDAGES/DRESSINGS) ×1 IMPLANT
ELECT REM PT RETURN 9FT ADLT (ELECTROSURGICAL) ×3
ELECTRODE REM PT RTRN 9FT ADLT (ELECTROSURGICAL) ×1 IMPLANT
GLOVE BIO SURGEON STRL SZ7.5 (GLOVE) ×3 IMPLANT
GLOVE BIOGEL PI IND STRL 6.5 (GLOVE) ×1 IMPLANT
GLOVE BIOGEL PI IND STRL 8.5 (GLOVE) ×1 IMPLANT
GLOVE BIOGEL PI INDICATOR 6.5 (GLOVE) ×2
GLOVE BIOGEL PI INDICATOR 8.5 (GLOVE) ×2
GOWN STRL REUS W/ TWL LRG LVL3 (GOWN DISPOSABLE) IMPLANT
GOWN STRL REUS W/ TWL XL LVL3 (GOWN DISPOSABLE) ×5 IMPLANT
GOWN STRL REUS W/TWL LRG LVL3 (GOWN DISPOSABLE)
GOWN STRL REUS W/TWL XL LVL3 (GOWN DISPOSABLE) ×10
KIT BASIN OR (CUSTOM PROCEDURE TRAY) ×3 IMPLANT
KIT ROOM TURNOVER OR (KITS) ×3 IMPLANT
NS IRRIG 1000ML POUR BTL (IV SOLUTION) ×3 IMPLANT
PACK CV ACCESS (CUSTOM PROCEDURE TRAY) ×3 IMPLANT
PAD ARMBOARD 7.5X6 YLW CONV (MISCELLANEOUS) ×6 IMPLANT
SUT MNCRL AB 4-0 PS2 18 (SUTURE) ×3 IMPLANT
SUT PROLENE 6 0 BV (SUTURE) ×6 IMPLANT
SUT VIC AB 3-0 SH 27 (SUTURE) ×2
SUT VIC AB 3-0 SH 27X BRD (SUTURE) ×1 IMPLANT
SYR 3ML LL SCALE MARK (SYRINGE) ×3 IMPLANT
UNDERPAD 30X30 (UNDERPADS AND DIAPERS) ×3 IMPLANT
WATER STERILE IRR 1000ML POUR (IV SOLUTION) ×3 IMPLANT

## 2017-04-05 NOTE — Transfer of Care (Signed)
2Immediate Anesthesia Transfer of Care Note  Patient: Teresa Dalton  Procedure(s) Performed: CEPHALIC VEIN THROMBECTOMY LEFT ARM and  BRACHIOCEPHALIC  ARTERIOVENOUS (AV) FISTULA CREATION LEFT ARM (Left Arm Upper)  Patient Location: PACU  Anesthesia Type:MAC  Level of Consciousness: awake, alert , oriented and sedated  Airway & Oxygen Therapy: Patient Spontanous Breathing and Patient connected to nasal cannula oxygen  Post-op Assessment: Report given to RN, Post -op Vital signs reviewed and stable and Patient moving all extremities  Post vital signs: Reviewed and stable  Last Vitals:  Vitals:   03/29/2017 0800 03/23/2017 1205  BP: (!) 148/57 95/79  Pulse: 76   Resp: 20 (!) 22  Temp: 37 C 37.3 C  SpO2: 99% 100%    Last Pain:  Vitals:   03/22/2017 0800  TempSrc: Oral  PainSc:       Patients Stated Pain Goal: 0 (49/17/91 5056)  Complications: No apparent anesthesia complications

## 2017-04-05 NOTE — Progress Notes (Signed)
PROGRESS NOTE    Teresa Dalton  VHQ:[id number] DOB: [birth year] DOA: 03/21/2017 PCP: Patient, No Pcp Per   Brief Narrative: 51 year old female with history of hypertension, type 2 diabetes, chronic kidney disease stage IV in 2015 who hasn't seen nephrology since presented with bilateral foot pain associated with lower extremity edema.  Assessment & Plan:  # New onset ESRD: -patient has been receiving hemodialysis via Tunneled catheter, tolerating well.  -s/p AV fistula by vascular surgeon on 10/25. -Monitor BMP and electrolytes. -discussed with the social worker and care management team regarding management of outpatient dialysis. Clipping process going on.  #Hypertension: Monitor blood pressure. Continue amlodipine, labetalol.  #Peripheral neuropathy pain/b/l lower leg pain: Patient reported with b/l leg pain. She said she has pain going on for more than a month. There is small superficial black colored wound ? 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  -doppler nagative for DVT -pain is not controlled with neurontin and robaxin. The dose of neurontin reduced to adjust ESRD -since pain is not better. I ordered xrays of legs and ABI vascular study.  -PT/OT ordered -continue supportive care  #Anemia of chronic kidney disease: Continue IV iron and PSA during dialysis. Monitor CBC  #Type 2 diabetes: Continue current insulin regimen. Monitor blood sugar level.blood sugar level acceptable.  #Obesity: Dietary adjustment and healthy lipase  #Hyperlipidemia: Continue statin   #mild temperature; Patient is afebrile now. Chest x-ray unremarkable. Monitor temperature, Tylenol as needed.  DVT prophylaxis:heparin subcutaneous Code Status:full code Family Communication:no family at bedside Disposition Plan:currently admitted    Consultants:   Nephrologist  Vascular surgeon  Procedures:tunneled catheter placement AVF creation on 10/25 Antimicrobials:none  Subjective: Continue to  report worsening leg pain. Worsened while on bed. Denied chest pain, SOB  Objective: Vitals:   Apr 10, 2017 1230 April 10, 2017 1245 April 10, 2017 1300 10-Apr-2017 1323  BP: (!) 154/87 (!) 163/82  139/62  Pulse: 81 78 81 82  Resp: 18 17 (!) 21 20  Temp:    98.6 F (37 C)  TempSrc:    Oral  SpO2: 95% 99% 99% 90%  Weight:      Height:        Intake/Output Summary (Last 24 hours) at April 10, 2017 1416 Last data filed at 04-10-2017 1215  Gross per 24 hour  Intake              600 ml  Output               25 ml  Net              575 ml   Filed Weights   04/04/17 0525 04/04/17 2049 Apr 10, 2017 0831  Weight: 121.8 kg (268 lb 8.3 oz) 121.6 kg (268 lb 1.3 oz) 121.6 kg (268 lb)    Examination:  General exam: lying on bed with complaining leg pain  Respiratory system: CTAB, no wheeze Cardiovascular system: RRR, s1s2 nl   Gastrointestinal system: Abdomen is nondistended, soft and nontender. Normal bowel sounds heard. Central nervous system: Alert and oriented. No focal neurological deficits. Extremities: b/l LE pitting edema, pedal pulse palpable, superficial wound on LLE. Skin: No rashes, lesions or ulcers Psychiatry: Judgement and insight appear normal. Mood & affect appropriate.     Data Reviewed: I have personally reviewed following labs and imaging studies  CBC:  Recent Labs Lab 03/29/2017 1937  04/01/17 0046 04/02/17 2154 04/03/17 1536 04/04/17 0206 April 10, 2017 0353  WBC 12.2*  < > 11.6* 12.6* 14.5* 15.1* 15.8*  NEUTROABS 9.1*  --  9.0*  --   --   --   --  HGB 8.2*  < > 7.9* 7.1* 7.6* 7.8* 7.6*  HCT 26.3*  < > 24.6* 21.9* 23.7* 24.6* 25.1*  MCV 92.9  < > 92.1 89.8 91.2 91.1 93.0  PLT 281  < > 267 287 310 315 324  < > = values in this interval not displayed. Basic Metabolic Panel:  Recent Labs Lab 03/31/17 0215 03/31/17 1758 04/01/17 0046 04/02/17 2100 04/03/17 1536 2017-05-02 0353  NA 137  137 137 135 137 138 142  K 5.7*  5.7* 7.2* 5.1 4.7 4.5 4.4  CL 110  109 108 104 105 103 103    CO2 15*  16* 16* 18* 19* 22 24  GLUCOSE 147*  147* 109* 79 118* 150* 96  BUN 89*  90* 92* 68* 77* 49* 44*  CREATININE 13.13*  12.99* 13.13* 10.73* 12.20* 9.22* 9.17*  CALCIUM 8.1*  7.9* 7.9* 8.1* 8.1* 8.5* 9.3  PHOS 8.8* 8.6*  --  7.6* 5.5*  --    GFR: Estimated Creatinine Clearance: 9.8 mL/min (A) (by C-G formula based on SCr of 9.17 mg/dL (H)). Liver Function Tests:  Recent Labs Lab 03/25/2017 1937 03/31/17 0215 03/31/17 1758 04/02/17 2100 04/03/17 1536  AST 11*  --   --   --   --   ALT 11*  --   --   --   --   ALKPHOS 118  --   --   --   --   BILITOT 0.8  --   --   --   --   PROT 6.8  --   --   --   --   ALBUMIN 2.8* 2.5* 2.6* 2.4* 2.5*   No results for input(s): LIPASE, AMYLASE in the last 168 hours. No results for input(s): AMMONIA in the last 168 hours. Coagulation Profile:  Recent Labs Lab 05/02/2017 0353  INR 1.21   Cardiac Enzymes: No results for input(s): CKTOTAL, CKMB, CKMBINDEX, TROPONINI in the last 168 hours. BNP (last 3 results) No results for input(s): PROBNP in the last 8760 hours. HbA1C: No results for input(s): HGBA1C in the last 72 hours. CBG:  Recent Labs Lab 04/04/17 1652 04/04/17 2046 2017-05-02 0816 May 02, 2017 1208 05-02-17 1318  GLUCAP 146* 149* 105* 93 92   Lipid Profile: No results for input(s): CHOL, HDL, LDLCALC, TRIG, CHOLHDL, LDLDIRECT in the last 72 hours. Thyroid Function Tests: No results for input(s): TSH, T4TOTAL, FREET4, T3FREE, THYROIDAB in the last 72 hours. Anemia Panel: No results for input(s): VITAMINB12, FOLATE, FERRITIN, TIBC, IRON, RETICCTPCT in the last 72 hours. Sepsis Labs: No results for input(s): PROCALCITON, LATICACIDVEN in the last 168 hours.  Recent Results (from the past 240 hour(s))  MRSA PCR Screening     Status: None   Collection Time: 04/01/17  2:45 PM  Result Value Ref Range Status   MRSA by PCR NEGATIVE NEGATIVE Final    Comment:        The GeneXpert MRSA Assay (FDA approved for NASAL  specimens only), is one component of a comprehensive MRSA colonization surveillance program. It is not intended to diagnose MRSA infection nor to guide or monitor treatment for MRSA infections.   Culture, blood (Routine X 2) w Reflex to ID Panel     Status: None (Preliminary result)   Collection Time: 04/03/17  7:01 PM  Result Value Ref Range Status   Specimen Description BLOOD LEFT WRIST  Final   Special Requests   Final    BOTTLES DRAWN AEROBIC AND ANAEROBIC Blood Culture adequate volume  Culture NO GROWTH < 24 HOURS  Final   Report Status PENDING  Incomplete  Culture, blood (Routine X 2) w Reflex to ID Panel     Status: None (Preliminary result)   Collection Time: 04/03/17  7:01 PM  Result Value Ref Range Status   Specimen Description BLOOD RIGHT ANTECUBITAL  Final   Special Requests   Final    BOTTLES DRAWN AEROBIC AND ANAEROBIC Blood Culture results may not be optimal due to an excessive volume of blood received in culture bottles   Culture NO GROWTH < 24 HOURS  Final   Report Status PENDING  Incomplete  Surgical pcr screen     Status: None   Collection Time: 03/21/2017  7:43 AM  Result Value Ref Range Status   MRSA, PCR NEGATIVE NEGATIVE Final   Staphylococcus aureus NEGATIVE NEGATIVE Final    Comment: (NOTE) The Xpert SA Assay (FDA approved for NASAL specimens in patients 49 years of age and older), is one component of a comprehensive surveillance program. It is not intended to diagnose infection nor to guide or monitor treatment.          Radiology Studies: Dg Chest 2 View  Result Date: 04/03/2017 CLINICAL DATA:  Fever.  Smoker.  End-stage renal disease. EXAM: CHEST  2 VIEW COMPARISON:  2017-06-08. FINDINGS: Interval right jugular catheter with its tip at the superior cavoatrial junction. No pneumothorax. Stable enlarged cardiac silhouette and grossly clear lungs. Thoracic spine degenerative changes. IMPRESSION: No acute abnormality.  Stable cardiomegaly.  Electronically Signed   By: Beckie SaltsSteven  Reid M.D.   On: 04/03/2017 21:19        Scheduled Meds: . amLODipine  10 mg Oral QHS  . calcitRIOL  0.5 mcg Oral QODAY  . darbepoetin (ARANESP) injection - DIALYSIS  200 mcg Intravenous Q Mon-HD  . [START ON 04/06/2017] gabapentin  300 mg Oral Daily  . heparin subcutaneous  5,000 Units Subcutaneous Q8H  . HYDROcodone-acetaminophen      . insulin aspart  0-15 Units Subcutaneous TID WC  . insulin detemir  32 Units Subcutaneous BID  . labetalol  200 mg Oral BID  . lanthanum  1,000 mg Oral TID WC  . methocarbamol      . methocarbamol  500 mg Oral TID  . multivitamin  1 tablet Oral QHS  . simvastatin  20 mg Oral Daily   Continuous Infusions: . sodium chloride 10 mL/hr at 04/11/2017 0844  . ferric gluconate (FERRLECIT/NULECIT) IV Stopped (04/03/17 1744)     LOS: 6 days    Dron Jaynie CollinsPrasad Bhandari, MD [facility] Pager [phone]  If 7PM-7AM, please contact night-coverage [email or web address] Password [email or web address] 03/21/2017, 2:16 PM

## 2017-04-05 NOTE — Anesthesia Postprocedure Evaluation (Signed)
Anesthesia Post Note  Patient: Teresa Dalton  Procedure(s) Performed: CEPHALIC VEIN THROMBECTOMY LEFT ARM and  BRACHIOCEPHALIC  ARTERIOVENOUS (AV) FISTULA CREATION LEFT ARM (Left Arm Upper)     Patient location during evaluation: PACU Anesthesia Type: MAC Level of consciousness: awake and alert Pain management: pain level controlled Vital Signs Assessment: post-procedure vital signs reviewed and stable Respiratory status: spontaneous breathing, nonlabored ventilation, respiratory function stable and patient connected to nasal cannula oxygen Cardiovascular status: stable and blood pressure returned to baseline Postop Assessment: no apparent nausea or vomiting Anesthetic complications: no    Last Vitals:  Vitals:   03/21/2017 0800 03/28/2017 1205  BP: (!) 148/57 95/79  Pulse: 76   Resp: 20 (!) 22  Temp: 37 C 37.3 C  SpO2: 99% 100%    Last Pain:  Vitals:   03/13/2017 0800  TempSrc: Oral  PainSc:                  [name]

## 2017-04-05 NOTE — Progress Notes (Signed)
S: [name] continues to have bilateral lower extremity pain. This pain is from her knees down to her feet and associated with exquisite tenderness to palpation. At times she feels pain in her posterior thighs. She denies any new concerns.   O:BP (!) 163/82   Pulse 78   Temp 99.1 F (37.3 C)   Resp 17   Ht 5\' 5"  (1.651 m)   Wt 268 lb (121.6 kg)   LMP 03/26/2017   SpO2 99%   BMI 44.60 kg/m   Intake/Output Summary (Last 24 hours) at 2016-12-12 1319 Last data filed at 2016-12-12 1215  Gross per 24 hour  Intake              840 ml  Output               25 ml  Net              815 ml   Intake/Output: I/O last 3 completed shifts: In: 1310 [P.O.:1200; IV Piggyback:110] Out: 751 [Urine:750; Stool:1]  Intake/Output this shift:  Total I/O In: -  Out: 25 [Blood:25] Weight change: -13 lb 0.1 oz (-5.9 kg) Gen: no acute distress  CVS: RRR, grade 2/6 systolic murmur loudest over apex  Resp: lungs sound clear decreased bs Abd: soft, non tender, non distended  Liver down 6 cm Ext: 2+ bilateral lower extremity pitting, negative straight leg raise, exquisite tenderness to palpation of the lower extremities, 10 cm eschars over bilateral anterior shins   Recent Labs Lab 04/01/2017 1937 03/31/17 0215 03/31/17 1758 04/01/17 0046 04/02/17 2100 04/03/17 1536 2016-12-12 0353  NA 136 137  137 137 135 137 138 142  K 6.1* 5.7*  5.7* 7.2* 5.1 4.7 4.5 4.4  CL 110 110  109 108 104 105 103 103  CO2 13* 15*  16* 16* 18* 19* 22 24  GLUCOSE 166* 147*  147* 109* 79 118* 150* 96  BUN 89* 89*  90* 92* 68* 77* 49* 44*  CREATININE 13.26* 13.13*  12.99* 13.13* 10.73* 12.20* 9.22* 9.17*  ALBUMIN 2.8* 2.5* 2.6*  --  2.4* 2.5*  --   CALCIUM 8.1* 8.1*  7.9* 7.9* 8.1* 8.1* 8.5* 9.3  PHOS  --  8.8* 8.6*  --  7.6* 5.5*  --   AST 11*  --   --   --   --   --   --   ALT 11*  --   --   --   --   --   --    Liver Function Tests:  Recent Labs Lab 03/13/2017 1937  03/31/17 1758 04/02/17 2100  04/03/17 1536  AST 11*  --   --   --   --   ALT 11*  --   --   --   --   ALKPHOS 118  --   --   --   --   BILITOT 0.8  --   --   --   --   PROT 6.8  --   --   --   --   ALBUMIN 2.8*  < > 2.6* 2.4* 2.5*  < > = values in this interval not displayed. No results for input(s): LIPASE, AMYLASE in the last 168 hours. No results for input(s): AMMONIA in the last 168 hours. CBC:  Recent Labs Lab 04/02/2017 1937  04/01/17 0046 04/02/17 2154 04/03/17 1536 04/04/17 0206 2016-12-12 0353  WBC 12.2*  < > 11.6* 12.6* 14.5* 15.1* 15.8*  NEUTROABS 9.1*  --  9.0*  --   --   --   --   HGB 8.2*  < > 7.9* 7.1* 7.6* 7.8* 7.6*  HCT 26.3*  < > 24.6* 21.9* 23.7* 24.6* 25.1*  MCV 92.9  < > 92.1 89.8 91.2 91.1 93.0  PLT 281  < > 267 287 310 315 324  < > = values in this interval not displayed. Cardiac Enzymes: No results for input(s): CKTOTAL, CKMB, CKMBINDEX, TROPONINI in the last 168 hours. CBG:  Recent Labs Lab 04/04/17 1151 04/04/17 1652 04/04/17 2046 03/13/2017 0816 03/18/2017 1208  GLUCAP 133* 146* 149* 105* 93    Iron Studies: No results for input(s): IRON, TIBC, TRANSFERRIN, FERRITIN in the last 72 hours. Studies/Results: Dg Chest 2 View  Result Date: 04/03/2017 CLINICAL DATA:  Fever.  Smoker.  End-stage renal disease. EXAM: CHEST  2 VIEW COMPARISON:  03-31-17. FINDINGS: Interval right jugular catheter with its tip at the superior cavoatrial junction. No pneumothorax. Stable enlarged cardiac silhouette and grossly clear lungs. Thoracic spine degenerative changes. IMPRESSION: No acute abnormality.  Stable cardiomegaly. Electronically Signed   By: Beckie Salts M.D.   On: 04/03/2017 21:19   . amLODipine  10 mg Oral QHS  . calcitRIOL  0.5 mcg Oral QODAY  . darbepoetin (ARANESP) injection - DIALYSIS  200 mcg Intravenous Q Mon-HD  . gabapentin  300 mg Oral BID  . heparin subcutaneous  5,000 Units Subcutaneous Q8H  . HYDROcodone-acetaminophen      . insulin aspart  0-15 Units Subcutaneous TID  WC  . insulin detemir  32 Units Subcutaneous BID  . labetalol  200 mg Oral BID  . lanthanum  1,000 mg Oral TID WC  . methocarbamol      . methocarbamol  500 mg Oral TID  . multivitamin  1 tablet Oral QHS  . simvastatin  20 mg Oral Daily    BMET    Component Value Date/Time   NA 142 03/19/2017 0353   K 4.4 03/23/2017 0353   CL 103 03/19/2017 0353   CO2 24 03/22/2017 0353   GLUCOSE 96 04/09/2017 0353   BUN 44 (H) 03/22/2017 0353   CREATININE 9.17 (H) 04/02/2017 0353   CREATININE 1.40 (H) 07/21/2011 1429   CALCIUM 9.3 03/27/2017 0353   GFRNONAA 4 ([place]) 04/09/2017 0353   GFRAA 5 ([place]) 04/01/2017 0353   CBC    Component Value Date/Time   WBC 15.8 (H) 03/29/2017 0353   RBC 2.70 ([place]) 03/19/2017 0353   HGB 7.6 ([place]) 03/24/2017 0353   HCT 25.1 ([place]) 04/04/2017 0353   PLT 324 04/06/2017 0353   MCV 93.0 03/17/2017 0353   MCH 28.1 03/22/2017 0353   MCHC 30.3 04/03/2017 0353   RDW 14.5 03/17/2017 0353   LYMPHSABS 1.4 04/01/2017 0046   MONOABS 0.7 04/01/2017 0046   EOSABS 0.5 04/01/2017 0046   BASOSABS 0.0 04/01/2017 0046   Assessment/Plan:  1. ESRD - Went for 3rd initiation HD session Tues. First stage of basilic vein fistula this morning. Scheduled for dialysis today. Azotemic without symptoms of uremia, normokalemic, normal gap.  CLIP in process.  2. Anemia - IV ferric gluconate with HD and ESA  3. Mineral metabolic disorder - calcitriol and lanthanum  4. HTN- Blood pressure improving with HD and Amlodipine 10 mg qHS  5. Neuropathic leg pain - continue gabapentin, primary is working to workup other causes for the leg pain  6. DM - controlled, per primary  7. Non adherance - will need continued encouragement  8. Obesity -  will need continued encouragement  9. Hyperlipidemia - continue simvastatin   Teresa Pont, MD  I have seen and examined this patient and agree with the plan of care seen, eval, counseled, examined, discussed with resident. .  Teresa Dalton 04/08/2017, 6:23  PM

## 2017-04-05 NOTE — Progress Notes (Signed)
[facility] [address] .Tues,Thurs,Sat schedule chairtime 12:15pm waiting on an start date

## 2017-04-05 NOTE — Progress Notes (Signed)
  Progress Note    04/07/2017 9:26 AM Day of Surgery  Subjective:  No complaints  Vitals:   03/22/2017 0514 03/27/2017 0800  BP: (!) 148/64 (!) 148/57  Pulse: 88 76  Resp: 19 20  Temp: 98.7 F (37.1 C) 98.6 F (37 C)  SpO2: 95% 99%    Physical Exam: aaox3 Non labored respirations Left arm palpable radial pulse  CBC    Component Value Date/Time   WBC 15.8 (H) 04/06/2017 0353   RBC 2.70 (L) 03/17/2017 0353   HGB 7.6 (L) 03/21/2017 0353   HCT 25.1 (L) 03/14/2017 0353   PLT 324 03/23/2017 0353   MCV 93.0 03/21/2017 0353   MCH 28.1 03/18/2017 0353   MCHC 30.3 03/27/2017 0353   RDW 14.5 04/07/2017 0353   LYMPHSABS 1.4 04/01/2017 0046   MONOABS 0.7 04/01/2017 0046   EOSABS 0.5 04/01/2017 0046   BASOSABS 0.0 04/01/2017 0046    BMET    Component Value Date/Time   NA 142 04/08/2017 0353   K 4.4 04/02/2017 0353   CL 103 03/19/2017 0353   CO2 24 03/30/2017 0353   GLUCOSE 96 03/14/2017 0353   BUN 44 (H) 03/12/2017 0353   CREATININE 9.17 (H) 04/09/2017 0353   CREATININE 1.40 (H) 07/21/2011 1429   CALCIUM 9.3 03/26/2017 0353   GFRNONAA 4 (L) 04/07/2017 0353   GFRAA 5 (L) 04/03/2017 0353    INR    Component Value Date/Time   INR 1.21 04/10/2017 0353     Intake/Output Summary (Last 24 hours) at 04/10/2017 0926 Last data filed at 03/26/2017 0601  Gross per 24 hour  Intake             1080 ml  Output              151 ml  Net              929 ml     Assessment:  51 y.o. female is here with esrd, needs permanent access  Plan: OR today for left arm avf vs avg. Discussed risks benefits and alternatives as well as expected outcomes including possibility of needing further procedures in the future and possibly a second operation if basilic vein is used.   Rishi Vicario C. Randie Heinzain, MD [facility] Office: [phone] Pager: [phone]  03/25/2017 9:26 AM

## 2017-04-05 NOTE — Anesthesia Preprocedure Evaluation (Signed)
Anesthesia Evaluation  Patient identified by MRN, date of birth, ID band Patient awake    Reviewed: Allergy & Precautions, NPO status , Patient's Chart, lab work & pertinent test results  Airway Mallampati: II  TM Distance: >3 FB Neck ROM: Full    Dental no notable dental hx.    Pulmonary Current Smoker,    Pulmonary exam normal breath sounds clear to auscultation       Cardiovascular hypertension, Pt. on medications Normal cardiovascular exam Rhythm:Regular Rate:Normal     Neuro/Psych negative neurological ROS  negative psych ROS   GI/Hepatic negative GI ROS, Neg liver ROS,   Endo/Other  diabetes, Type 2Morbid obesity  Renal/GU ESRF and DialysisRenal disease  negative genitourinary   Musculoskeletal negative musculoskeletal ROS (+)   Abdominal   Peds negative pediatric ROS (+)  Hematology negative hematology ROS (+)   Anesthesia Other Findings   Reproductive/Obstetrics negative OB ROS                             Anesthesia Physical Anesthesia Plan  ASA: IV  Anesthesia Plan: MAC   Post-op Pain Management:    Induction: Intravenous  PONV Risk Score and Plan: 2 and Ondansetron and Treatment may vary due to age or medical condition  Airway Management Planned: Simple Face Mask  Additional Equipment:   Intra-op Plan:   Post-operative Plan:   Informed Consent: I have reviewed the patients History and Physical, chart, labs and discussed the procedure including the risks, benefits and alternatives for the proposed anesthesia with the patient or authorized representative who has indicated his/her understanding and acceptance.   Dental advisory given  Plan Discussed with: CRNA  Anesthesia Plan Comments:         Anesthesia Quick Evaluation

## 2017-04-05 NOTE — Care Management Note (Signed)
Case Management Note  Patient Details  Name: Roma KayserDiahann Woodell V. MRN: [id number] Date of Birth: July 04, 1965  Subjective/Objective:     CM following for progression and d/c needs.                Action/Plan: 03/28/2017  Noted CM referral for HH needs. Pt is ambulatory, no Pt or OT evals, no HHRN needs identified at this time. Await clarification of needs.   Expected Discharge Date:                  Expected Discharge Plan:  Home/Self Care  In-House Referral:  NA  Discharge planning Services  CM Consult  Post Acute Care Choice:    Choice offered to:     DME Arranged:    DME Agency:     HH Arranged:    HH Agency:     Status of Service:  In process, will continue to follow  If discussed at Long Length of Stay Meetings, dates discussed:    Additional Comments:  Starlyn SkeansRoyal, Krithi Bray U, RN 03/17/2017, 3:16 PM

## 2017-04-05 NOTE — Op Note (Signed)
    Patient name: Teresa Dalton MRN: [id number] DOB: 08/28/1965 Sex: female  04/03/2017 Pre-operative Diagnosis: esrd Post-operative diagnosis:  Same Surgeon:  Erlene Quan C. Donzetta Matters, MD Assistant: Linus Orn Procedure Performed: 1.  Thrombectomy of left arm cephalic vein 2.  Left arm brachiocephalic arteriovenous fistula creation.  Indications: 51 year old female on dialysis via catheter in need of permanent access.  She has preoperative vein mapping to suggest a thrombosed cephalic vein on the left with patent adequate size basilic vein on the left and she is right-hand dominant.  Findings: Cephalic vein was thrombosed however on open exploration it was large and after thrombectomy was suitable for fistula creation with adequate back bleeding and flushed easily.  I completion there was a strong thrill in the vein in the upper arm and a weak palpable left radial pulse.   Procedure:  The patient was identified in the holding area and taken to the operating room where she was placed supine on the operative table and MAC anesthesia induced.  She was sterilely prepped and draped in the left arm for access procedure given antibiotics and timeout called.  Following this we used ultrasound to identify her cephalic vein which was notably thrombosed only near the antecubital at the site of a previous IV site.  There is a large basilic vein on the medial aspect of the left arm was also identified and marked the skin.  A transverse incision was then made below the antecubital we dissected out the cephalic vein which was noted to be thrombosed from external appearance.  Marked for orientation and then divided the deep fascia identified our median nerve protected this placed a vessel loop around the brachial artery.  The cephalic vein was then transected distally and tied off.  A 3 Fogarty was used for thrombectomy and then we had brisk backbleeding flushed easily.  Size was adequate for fistula creation with  diameter of approximately 5 mm.  The artery was then clamped distally and proximally over the longitudinally and flushed with heparinized saline both directions.  The vein was then sewn inside with 6-0 Prolene suture and prior to completing the anastomosis we allow flushing techniques both directions.  At completion there is a strong thrill in the renal vein and weak palpable radial pulse at the wrist.  This was confirmed with Doppler.  Hemostasis was then obtained and the wound was closed in 2 layers with 3-0 Vicryl 4-0 Monocryl.  Patient tolerated procedure well without immediate complication and all counts were correct at completion.  EBL:  20 cc.     Jeiden Daughtridge C. Donzetta Matters, MD [facility] Office: [phone] Pager: [phone]

## 2017-04-05 NOTE — Procedures (Signed)
Patient was seen on dialysis and the procedure was supervised.  BFR 400  Via PC BP is  129/68   Patient appears to be tolerating treatment well  [name] A 04/03/2017

## 2017-04-06 ENCOUNTER — Encounter (HOSPITAL_COMMUNITY): Payer: Self-pay

## 2017-04-06 ENCOUNTER — Encounter (HOSPITAL_COMMUNITY): Payer: Self-pay | Admitting: Vascular Surgery

## 2017-04-06 ENCOUNTER — Telehealth: Payer: Self-pay | Admitting: Vascular Surgery

## 2017-04-06 ENCOUNTER — Inpatient Hospital Stay (HOSPITAL_COMMUNITY): Payer: BLUE CROSS/BLUE SHIELD

## 2017-04-06 DIAGNOSIS — L8990 Pressure ulcer of unspecified site, unspecified stage: Secondary | ICD-10-CM

## 2017-04-06 DIAGNOSIS — M79609 Pain in unspecified limb: Secondary | ICD-10-CM

## 2017-04-06 DIAGNOSIS — M79604 Pain in right leg: Secondary | ICD-10-CM

## 2017-04-06 DIAGNOSIS — M79605 Pain in left leg: Secondary | ICD-10-CM

## 2017-04-06 DIAGNOSIS — R5381 Other malaise: Secondary | ICD-10-CM

## 2017-04-06 LAB — GLUCOSE, CAPILLARY
GLUCOSE-CAPILLARY: 120 mg/dL — AB (ref 65–99)
GLUCOSE-CAPILLARY: 130 mg/dL — AB (ref 65–99)
Glucose-Capillary: 128 mg/dL — ABNORMAL HIGH (ref 65–99)
Glucose-Capillary: 193 mg/dL — ABNORMAL HIGH (ref 65–99)

## 2017-04-06 MED ORDER — OXYCODONE HCL 5 MG PO TABS
5.0000 mg | ORAL_TABLET | Freq: Once | ORAL | Status: AC
  Administered 2017-04-06: 5 mg via ORAL
  Filled 2017-04-06: qty 1

## 2017-04-06 NOTE — Telephone Encounter (Signed)
Sched lab 05/15/17 at 4:00 and MD 05/18/17 at 11:45. Lm on hm#.

## 2017-04-06 NOTE — Evaluation (Signed)
Physical Therapy Evaluation Patient Details Name: Teresa KayserDiahann Scherer V. MRN: [id number] DOB: 06/16/1965 Today's Date: 04/06/2017   History of Present Illness  51 year old female with history of hypertension, type 2 diabetes, chronic kidney disease stage IV in 2015 who hasn't seen nephrology since presented with bilateral foot pain associated with lower extremity edema.  Clinical Impression  Pt admitted with above diagnosis. Pt currently with functional limitations due to the deficits listed below (see PT Problem List). On eval, pt required mod assist sit to supine, mod assist sit to stand and +2 max assist stand-pivot transfers. Pt with agitation toward therapist with attempts to block L knee during transfer. Explained to pt, assist needed to prevent knee buckling otherwise pt could fall.  Pt demo self-limiting behavior. Unable to progress ambulation due to BLE pain and weakness. Pt will benefit from skilled PT to increase their independence and safety with mobility to allow discharge to the venue listed below.       Follow Up Recommendations CIR    Equipment Recommendations  Other (comment) (TBD)    Recommendations for Other Services       Precautions / Restrictions Precautions Precautions: Fall Restrictions Weight Bearing Restrictions: No      Mobility  Bed Mobility Overal bed mobility: Needs Assistance Bed Mobility: Sit to Supine       Sit to supine: Mod assist   General bed mobility comments: assist with BLE into bed  Transfers Overall transfer level: Needs assistance Equipment used: Rolling walker (2 wheeled) Transfers: Sit to/from [employer] CorporationStand;Stand Pivot Transfers Sit to Stand: Mod assist Stand pivot transfers: Mod assist;+2 physical assistance       General transfer comment: RW utilized for sit to stand. Pt able to stand statically with RW ~ 30 seconds with min assist x 2 trials. RW removed for SPT. Bilat knee buckling noted with pivot transfer requiring +2 max assist  to maintain stance.  Ambulation/Gait             General Gait Details: unable  Stairs            Wheelchair Mobility    Modified Rankin (Stroke Patients Only)       Balance Overall balance assessment: Needs assistance Sitting-balance support: Feet supported;No upper extremity supported Sitting balance-Leahy Scale: Fair Sitting balance - Comments: static sitting EOB, 1 LOB to R while sitting EOB with Pt able to self correct to bring trunk back to midline    Standing balance support: Bilateral upper extremity supported Standing balance-Leahy Scale: Poor Standing balance comment: reliant on bil UE support on RW and external support from therapist                              Pertinent Vitals/Pain Pain Assessment: 0-10 Pain Score: 8  Faces Pain Scale: Hurts worst Pain Location: BLEs Pain Descriptors / Indicators: Shooting;Sharp Pain Intervention(s): Limited activity within patient's tolerance;Monitored during session    Home Living Family/patient expects to be discharged to:: Private residence Living Arrangements: Alone Available Help at Discharge: Family;Available PRN/intermittently Type of Home: Apartment Home Access: Stairs to enter Entrance Stairs-Rails: Right;Left;Can reach both Entrance Stairs-Number of Steps: 3 Home Layout: One level Home Equipment: None Additional Comments: Info currently for Pt's home setup; Pt reports possibility of staying with her son after discharge though with increased lethargy/difficulty answering questions, will follow up to obtain son's home setup information     Prior Function Level of Independence: Independent  Comments: Pt works two jobs, was driving      Therapist, sports Extremity Assessment Upper Extremity Assessment: Defer to OT evaluation    Lower Extremity Assessment Lower Extremity Assessment: Generalized weakness    Cervical / Trunk  Assessment Cervical / Trunk Assessment: Normal  Communication   Communication: No difficulties  Cognition Arousal/Alertness: Awake/alert Behavior During Therapy: WFL for tasks assessed/performed Overall Cognitive Status: Within Functional Limits for tasks assessed                                 General Comments: Pt reports she has not slept well for 3 days.      General Comments      Exercises     Assessment/Plan    PT Assessment Patient needs continued PT services  PT Problem List Decreased strength;Decreased activity tolerance;Decreased balance;Decreased mobility;Pain       PT Treatment Interventions DME instruction;Gait training;Functional mobility training;Therapeutic activities;Therapeutic exercise;Balance training;Patient/family education    PT Goals (Current goals can be found in the Care Plan section)  Acute Rehab PT Goals Patient Stated Goal: return home, move with less pain  PT Goal Formulation: With patient Time For Goal Achievement: 04/20/17 Potential to Achieve Goals: Good    Frequency Min 3X/week   Barriers to discharge        Co-evaluation               AM-PAC PT "6 Clicks" Daily Activity  Outcome Measure Difficulty turning over in bed (including adjusting bedclothes, sheets and blankets)?: A Little Difficulty moving from lying on back to sitting on the side of the bed? : A Lot Difficulty sitting down on and standing up from a chair with arms (e.g., wheelchair, bedside commode, etc,.)?: Unable Help needed moving to and from a bed to chair (including a wheelchair)?: A Lot Help needed walking in hospital room?: Total Help needed climbing 3-5 steps with a railing? : Total 6 Click Score: 10    End of Session Equipment Utilized During Treatment: Gait belt Activity Tolerance: Patient limited by pain Patient left: in chair;with call bell/phone within reach;with chair alarm set Nurse Communication: Mobility status PT Visit  Diagnosis: Other abnormalities of gait and mobility (R26.89);Pain Pain - part of body: Leg    Time: 1129 [phone] PT Time Calculation (min) (ACUTE ONLY): 15 min + 11 min   Charges:   PT Evaluation $PT Eval Moderate Complexity: 1 Mod PT Treatments $Therapeutic Activity: 8-22 mins   PT G Codes:        Aida Raider, PT  Office # [phone] Pager [phone]   Ilda Foil 04/06/2017, 1:29 PM

## 2017-04-06 NOTE — Progress Notes (Signed)
Nutrition Education Note  Pt is a 51 year old female presented with bilateral foot pain associated with LE edema. PMH of HTN, T2DM,  CKD st IV. Pt starting HD soon.   Consulted to provide renal/carb mod diet education.  Discussed the effects that high-phosphorus and high-potassium foods have on the body along with specific foods and drinks to limit or avoid if possible.Discussed phosphate binders and fluid intake.   Pt typically eats eggs, sausage, and biscuits in the morning at her place of work at the end of her shift as she works nights. Pt reports snacking throughout her shift but didn't name what she snacks on. She then has a "dinner" of meat/gravy, vegetables, and a starch. Discussed the importance of lowering sodium intake in the foods she's eating and not adding salt during cooking or at the table.  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 She typically eats whatever they have. Noted fried chicken and macaroni and cheese in pts room.   Discussed and provided the following handouts from the [facility], 'Dietary Guidelines for Adults Starting on Hemodialysis' and 'Sodium, Phosphorus, Potassium + the CKD Diet'. Also provided pt with a  Nutrition Care Manual handout on limiting fluids and specific amounts per day.  Reminded pt that a RD would monitor her at her dialysis center and would assist as needed. Pt had no further questions.Expect fair compliance.  Chart and medications reviewed.  Lab reviewed and include CBGs: 92-121-128, Cr 9.17 (H), GFR 4 (L), BUN 44 (H).  Wynetta EmeryGrace Herman [facility] Dietetic Intern Pager: [phone] 04/06/2017 11:14 AM

## 2017-04-06 NOTE — Telephone Encounter (Signed)
-----   Message from Marilyn K McChesney, RN sent at 04/06/2017 12:33 PM EDT ----Sharee Pimple- Regarding: 6 weeks w/ duplex   ----- Message ----- From: Maeola Harmanain, Brandon Christopher, MD Sent: 04/06/2017   8:22 AM To: [address]  Teresa Dalton Teresa Dalton  6 week f/u with dialysis duplex

## 2017-04-06 NOTE — Progress Notes (Signed)
PROGRESS NOTE    Teresa Dalton  WUJ:[id number] DOB: 12-Nov-1965 DOA: 03/12/2017 PCP: Patient, No Pcp Per   Brief Narrative: 51 year old female with history of hypertension, type 2 diabetes, chronic kidney disease stage IV in 2015 who hasn't seen nephrology since presented with bilateral foot pain associated with lower extremity edema.  Assessment & Plan:  # New onset ESRD: -patient has been receiving hemodialysis via Tunneled catheter, tolerating well.  -s/p AV fistula by vascular surgeon on 10/25. -Monitor BMP and electrolytes. -Outpatient hemodialysis arranged.  #Hypertension: Monitor blood pressure. Continue amlodipine, labetalol.  #Peripheral neuropathy pain/b/l lower leg pain: Patient reported with b/l leg pain. She said she has pain going on for more than a month. There is small superficial black colored wound ? 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  -doppler nagative for DVT -pain is not controlled with neurontin and robaxin. The dose of neurontin reduced to adjust ESRD -since pain is not better. -X-ray with subcutaneous edema. Pending ABI vascular study  -PT/OT recommended CIR. CIR consult requested for safe discharge planning. -continue supportive care  #Anemia of chronic kidney disease: Continue IV iron and PSA during dialysis. Monitor CBC  #Type 2 diabetes: Continue current insulin regimen. Monitor blood sugar level.blood sugar level acceptable.  #Obesity: Dietary adjustment and healthy lipase  #Hyperlipidemia: Continue statin   #mild temperature; Patient is afebrile now. Chest x-ray unremarkable. Monitor temperature, Tylenol as needed.  DVT prophylaxis:heparin subcutaneous Code Status:full code Family Communication:no family at bedside Disposition Plan:currently admitted    Consultants:   Nephrologist  Vascular surgeon  Procedures:tunneled catheter placement AVF creation on 10/25 Antimicrobials:none  Subjective: Bilateral foot pain is stable. No new  event. Denied headache, dizziness, nausea vomiting or chest pain.  Objective: Vitals:   April 17, 2017 1848 Apr 17, 2017 2148 04/06/17 0427 04/06/17 0900  BP: 130/76 101/77 (!) 155/60 (!) 157/83  Pulse: 78 96 91 82  Resp: 18 18 19 18   Temp: 98.8 F (37.1 C) 99.5 F (37.5 C) 98.9 F (37.2 C) 98.6 F (37 C)  TempSrc: Oral Oral Oral Oral  SpO2: 95% 93% 91% 96%  Weight: 117.6 kg (259 lb 4.2 oz) 117.8 kg (259 lb 11.2 oz)    Height:        Intake/Output Summary (Last 24 hours) at 04/06/17 1113 Last data filed at 04/06/17 0600  Gross per 24 hour  Intake               60 ml  Output             4025 ml  Net            -3965 ml   Filed Weights   04/13/2017 1418 05/10/2017 1848 17-Apr-2017 2148  Weight: 121.6 kg (268 lb 1.3 oz) 117.6 kg (259 lb 4.2 oz) 117.8 kg (259 lb 11.2 oz)    Examination:  General exam: Sitting on chair comfortably, not in distress  Respiratory system: Clear bilaterally, no wheezing or crackle Cardiovascular system: Regular rate rhythm, S1-S2 normal. Gastrointestinal system: Abdomen is nondistended, soft and nontender. Normal bowel sounds heard. Central nervous system: Alert and oriented. No focal neurological deficits. Extremities: b/l LE pitting edema, pedal pulse palpable, superficial wound on LLE. Skin: No rashes, lesions or ulcers Psychiatry: Judgement and insight appear normal. Mood & affect appropriate.     Data Reviewed: I have personally reviewed following labs and imaging studies  CBC:  Recent Labs Lab 03/28/2017 1937  04/01/17 0046 04/02/17 2154 04/03/17 1536 04/04/17 0206 05/05/2017 0353  WBC 12.2*  < > 11.6*  12.6* 14.5* 15.1* 15.8*  NEUTROABS 9.1*  --  9.0*  --   --   --   --   HGB 8.2*  < > 7.9* 7.1* 7.6* 7.8* 7.6*  HCT 26.3*  < > 24.6* 21.9* 23.7* 24.6* 25.1*  MCV 92.9  < > 92.1 89.8 91.2 91.1 93.0  PLT 281  < > 267 287 310 315 324  < > = values in this interval not displayed. Basic Metabolic Panel:  Recent Labs Lab 03/31/17 0215 03/31/17 1758  04/01/17 0046 04/02/17 2100 04/03/17 1536 04/04/2017 0353  NA 137  137 137 135 137 138 142  K 5.7*  5.7* 7.2* 5.1 4.7 4.5 4.4  CL 110  109 108 104 105 103 103  CO2 15*  16* 16* 18* 19* 22 24  GLUCOSE 147*  147* 109* 79 118* 150* 96  BUN 89*  90* 92* 68* 77* 49* 44*  CREATININE 13.13*  12.99* 13.13* 10.73* 12.20* 9.22* 9.17*  CALCIUM 8.1*  7.9* 7.9* 8.1* 8.1* 8.5* 9.3  PHOS 8.8* 8.6*  --  7.6* 5.5*  --    GFR: Estimated Creatinine Clearance: 9.6 mL/min (A) (by C-G formula based on SCr of 9.17 mg/dL (H)). Liver Function Tests:  Recent Labs Lab 04/07/2017 1937 03/31/17 0215 03/31/17 1758 04/02/17 2100 04/03/17 1536  AST 11*  --   --   --   --   ALT 11*  --   --   --   --   ALKPHOS 118  --   --   --   --   BILITOT 0.8  --   --   --   --   PROT 6.8  --   --   --   --   ALBUMIN 2.8* 2.5* 2.6* 2.4* 2.5*   No results for input(s): LIPASE, AMYLASE in the last 168 hours. No results for input(s): AMMONIA in the last 168 hours. Coagulation Profile:  Recent Labs Lab 03/23/2017 0353  INR 1.21   Cardiac Enzymes: No results for input(s): CKTOTAL, CKMB, CKMBINDEX, TROPONINI in the last 168 hours. BNP (last 3 results) No results for input(s): PROBNP in the last 8760 hours. HbA1C: No results for input(s): HGBA1C in the last 72 hours. CBG:  Recent Labs Lab 04/07/2017 1208 04/07/2017 1318 04/03/2017 2146 04/06/17 0748 04/06/17 1107  GLUCAP 93 92 121* 128* 193*   Lipid Profile: No results for input(s): CHOL, HDL, LDLCALC, TRIG, CHOLHDL, LDLDIRECT in the last 72 hours. Thyroid Function Tests: No results for input(s): TSH, T4TOTAL, FREET4, T3FREE, THYROIDAB in the last 72 hours. Anemia Panel: No results for input(s): VITAMINB12, FOLATE, FERRITIN, TIBC, IRON, RETICCTPCT in the last 72 hours. Sepsis Labs: No results for input(s): PROCALCITON, LATICACIDVEN in the last 168 hours.  Recent Results (from the past 240 hour(s))  MRSA PCR Screening     Status: None   Collection Time:  04/01/17  2:45 PM  Result Value Ref Range Status   MRSA by PCR NEGATIVE NEGATIVE Final    Comment:        The GeneXpert MRSA Assay (FDA approved for NASAL specimens only), is one component of a comprehensive MRSA colonization surveillance program. It is not intended to diagnose MRSA infection nor to guide or monitor treatment for MRSA infections.   Culture, blood (Routine X 2) w Reflex to ID Panel     Status: None (Preliminary result)   Collection Time: 04/03/17  7:01 PM  Result Value Ref Range Status   Specimen Description BLOOD LEFT  WRIST  Final   Special Requests   Final    BOTTLES DRAWN AEROBIC AND ANAEROBIC Blood Culture adequate volume   Culture NO GROWTH 2 DAYS  Final   Report Status PENDING  Incomplete  Culture, blood (Routine X 2) w Reflex to ID Panel     Status: None (Preliminary result)   Collection Time: 04/03/17  7:01 PM  Result Value Ref Range Status   Specimen Description BLOOD RIGHT ANTECUBITAL  Final   Special Requests   Final    BOTTLES DRAWN AEROBIC AND ANAEROBIC Blood Culture results may not be optimal due to an excessive volume of blood received in culture bottles   Culture NO GROWTH 2 DAYS  Final   Report Status PENDING  Incomplete  Surgical pcr screen     Status: None   Collection Time: 03/26/2017  7:43 AM  Result Value Ref Range Status   MRSA, PCR NEGATIVE NEGATIVE Final   Staphylococcus aureus NEGATIVE NEGATIVE Final    Comment: (NOTE) The Xpert SA Assay (FDA approved for NASAL specimens in patients 38 years of age and older), is one component of a comprehensive surveillance program. It is not intended to diagnose infection nor to guide or monitor treatment.          Radiology Studies: Dg Tibia/fibula Left  Result Date: 04/09/2017 CLINICAL DATA:  Bilateral leg pain for more than 1 month. No known injury. EXAM: LEFT TIBIA AND FIBULA - 2 VIEW COMPARISON:  None. FINDINGS: Diffuse subcutaneous edema.  Normal appearing bones. IMPRESSION: Diffuse  subcutaneous edema with no underlying bony abnormality. Electronically Signed   By: Beckie Salts M.D.   On: 04/02/2017 23:20   Dg Tibia/fibula Right  Result Date: 03/14/2017 CLINICAL DATA:  Bilateral leg pain for more than 1 month. No known injury. EXAM: RIGHT TIBIA AND FIBULA - 2 VIEW COMPARISON:  None. FINDINGS: Mild diffuse subcutaneous edema. Normal appearing tibia and fibula. Moderate-sized calcaneal spurs. IMPRESSION: Mild diffuse subcutaneous edema without underlying bony abnormality. Electronically Signed   By: Beckie Salts M.D.   On: 04/11/2017 23:21        Scheduled Meds: . amLODipine  10 mg Oral QHS  . calcitRIOL  0.5 mcg Oral QODAY  . darbepoetin (ARANESP) injection - DIALYSIS  200 mcg Intravenous Q Mon-HD  . gabapentin  300 mg Oral Daily  . heparin subcutaneous  5,000 Units Subcutaneous Q8H  . insulin aspart  0-15 Units Subcutaneous TID WC  . insulin detemir  32 Units Subcutaneous BID  . labetalol  200 mg Oral BID  . lanthanum  1,000 mg Oral TID WC  . methocarbamol  500 mg Oral TID  . multivitamin  1 tablet Oral QHS  . simvastatin  20 mg Oral Daily   Continuous Infusions: . sodium chloride 10 mL/hr at 03/16/2017 0844  . ferric gluconate (FERRLECIT/NULECIT) IV 125 mg (03/30/2017 1748)     LOS: 7 days    Aurthur Wingerter Jaynie Collins, MD [facility] Pager [phone]  If 7PM-7AM, please contact night-coverage [email or web address] Password [email or web address] 04/06/2017, 11:13 AM

## 2017-04-06 NOTE — Progress Notes (Signed)
ABI's have been completed. Preliminary results can be found in chart review -> CV Proc  04/06/17 2:21 PM Olen CordialGreg Caterine Mcmeans RVT

## 2017-04-06 NOTE — Evaluation (Signed)
Occupational Therapy Evaluation Patient Details Name: Teresa KayserDiahann Figuero V. MRN: [id number] DOB: [birth year] Today's Date: 04/06/2017    History of Present Illness 51 year old female with history of hypertension, type 2 diabetes, chronic kidney disease stage IV in 2015 who hasn't seen nephrology since presented with bilateral foot pain associated with lower extremity edema.   Clinical Impression   This 51 y/o F presents with the above. Pt lives alone, at baseline is independent with ADLs and functional mobility, was driving and working. Pt presents with increased pain, lethargy, decreased dynamic standing balance and activity tolerance. Pt require ModA +2 for stand pivot to recliner during session at RW level, unable to complete further mobility due to LE weakness/bil knee buckling, as well as due to increased lethargy during session. Pt currently requires MaxA for LB ADLs. Based on Pt's currently level, feel Pt will benefit from additional post acute OT services (pending Pt's progress) prior to return home as Pt reports she will not have 24 hr assist at home. Will continue to follow acutely to maximize Pt's safety and independence with ADLs and mobility.     Follow Up Recommendations  CIR;Supervision/Assistance - 24 hour;Other (comment) (pending progress; may progress home with HHOT)    Equipment Recommendations  3 in 1 bedside commode;Other (comment) (will need bariatric 3:1 BSC )           Precautions / Restrictions Precautions Precautions: Fall Restrictions Weight Bearing Restrictions: No      Mobility Bed Mobility               General bed mobility comments: Pt seated EOB upon arrival   Transfers Overall transfer level: Needs assistance Equipment used: Rolling walker (2 wheeled);None Transfers: Sit to/from UGI CorporationStand;Stand Pivot Transfers Sit to Stand: Max assist;Mod assist;+2 physical assistance;+2 safety/equipment Stand pivot transfers: Mod assist;+2 physical  assistance;+2 safety/equipment       General transfer comment: Pt completed sit<>stand x3; initially with +2 HHA with MaxA however Pt demonstrating bil knee buckling and returned to sitting EOB, completed additional sit<>stand with use of RW with ModA +2, ModA +2 for stand pivot to recliner; cues for hand placement and assist to boost into standing    Balance Overall balance assessment: Needs assistance Sitting-balance support: Feet supported;Single extremity supported Sitting balance-Leahy Scale: Fair Sitting balance - Comments: static sitting EOB, 1 LOB to R while sitting EOB with Pt able to self correct to bring trunk back to midline    Standing balance support: Bilateral upper extremity supported Standing balance-Leahy Scale: Poor Standing balance comment: reliant on bil UE support on RW and external support from therapist                            ADL either performed or assessed with clinical judgement   ADL Overall ADL's : Needs assistance/impaired Eating/Feeding: Set up;Sitting   Grooming: Set up;Sitting   Upper Body Bathing: Min guard;Sitting   Lower Body Bathing: Moderate assistance;Sit to/from stand   Upper Body Dressing : Min guard;Sitting   Lower Body Dressing: Maximal assistance;Sit to/from stand   Toilet Transfer: Moderate assistance;+2 for physical assistance;+2 for safety/equipment;Stand-pivot;BSC;RW   Toileting- Clothing Manipulation and Hygiene: Maximal assistance;Sit to/from stand       Functional mobility during ADLs: Moderate assistance;+2 for physical assistance;+2 for safety/equipment;Rolling walker General ADL Comments: Attempted room level functional mobility however Pt demonstrating bil LE buckling upon attempts at mobility, required +2 Modassist to safely return to sitting EOB after mobility attempt (  however Pt reports having previously been up to [facility] and ambulating to bathroom); Pt completed stand pivot to recliner using RW with ModA +2  where she was set up for grooming ADLs and breakfast                          Pertinent Vitals/Pain Pain Assessment: Faces Faces Pain Scale: Hurts worst Pain Location: bil LEs; LUE  Pain Descriptors / Indicators: Aching;Spasm;Crying Pain Intervention(s): Limited activity within patient's tolerance;Monitored during session;Repositioned          Extremity/Trunk Assessment Upper Extremity Assessment Upper Extremity Assessment: Overall WFL for tasks assessed   Lower Extremity Assessment Lower Extremity Assessment: Defer to PT evaluation       Communication Communication Communication: No difficulties   Cognition Arousal/Alertness: Lethargic Behavior During Therapy: WFL for tasks assessed/performed Overall Cognitive Status: Within Functional Limits for tasks assessed                                 General Comments: Pt with increased lethargy as session progressed, difficulty keeping eyes open end of session when answering therapist's questions                     Home Living Family/patient expects to be discharged to:: Private residence Living Arrangements: Alone Available Help at Discharge: Family;Available PRN/intermittently Type of Home: Apartment Home Access: Stairs to enter Entrance Stairs-Number of Steps: 3 Entrance Stairs-Rails: Right;Left;Can reach both Home Layout: One level     Bathroom Shower/Tub: Chief Strategy Officer: Standard     Home Equipment: None   Additional Comments: Info currently for Pt's home setup; Pt reports possibility of staying with her son after discharge though with increased lethargy/difficulty answering questions, will follow up to obtain son's home setup information       Prior Functioning/Environment Level of Independence: Independent        Comments: Pt works two jobs, was driving         OT Problem List: Decreased strength;Impaired balance (sitting and/or standing);Pain;Decreased  range of motion;Decreased activity tolerance;Obesity      OT Treatment/Interventions: Self-care/ADL training;DME and/or AE instruction;Therapeutic activities;Balance training;Therapeutic exercise;Energy conservation;Patient/family education    OT Goals(Current goals can be found in the care plan section) Acute Rehab OT Goals Patient Stated Goal: return home, move with less pain  OT Goal Formulation: With patient Time For Goal Achievement: 04/20/17 Potential to Achieve Goals: Good  OT Frequency: Min 2X/week                             AM-PAC PT "6 Clicks" Daily Activity     Outcome Measure Help from another person eating meals?: None Help from another person taking care of personal grooming?: A Little Help from another person toileting, which includes using toliet, bedpan, or urinal?: A Lot Help from another person bathing (including washing, rinsing, drying)?: A Lot Help from another person to put on and taking off regular upper body clothing?: A Little Help from another person to put on and taking off regular lower body clothing?: A Lot 6 Click Score: 16   End of Session Equipment Utilized During Treatment: Gait belt;Rolling walker Nurse Communication: Mobility status  Activity Tolerance: Patient limited by lethargy;Patient tolerated treatment well Patient left: in chair;with call bell/phone within reach;with chair alarm set  OT Visit Diagnosis: Unsteadiness  on feet (R26.81);Other abnormalities of gait and mobility (R26.89);Pain;Muscle weakness (generalized) (M62.81) Pain - Right/Left:  (bilateral ) Pain - part of body: Leg;Knee                Time: 1610-9604 OT Time Calculation (min): 27 min Charges:  OT General Charges $OT Visit: 1 Visit OT Evaluation $OT Eval Moderate Complexity: 1 Mod OT Treatments $Self Care/Home Management : 8-22 mins G-Codes:     Marcy Siren, OT Pager [phone] 04/06/2017   Orlando Penner 04/06/2017, 10:41 AM

## 2017-04-06 NOTE — Progress Notes (Signed)
I met with pt at bedside to discuss goals and expectations for an inpt rehab admission. Her [employer] differs greatly in her coverage in [place] vs [place]. I will follow up on Monday. I have begun authorization for a possible inpt rehab admit next week pending insurance approval and bed availability. I have discussed with RN Burley and SW. [phone]

## 2017-04-06 NOTE — Consult Note (Signed)
Physical Medicine and Rehabilitation Consult Reason for Consult:Decreased functional mobility Referring Physician: triad   HPI: Teresa Christon V. is a 51 y.o.right handed female with history of hypertension, CK D stage IV, diabetes mellitus.patient lives alone independently prior to admission. One level home 3 steps to entry.Two sons in the area work. Presented 03/21/2017 with increasing peripheral edema over the past few weeks intermittent shortness of breath.Creatinine 13.26 from baseline 3.98.Renal ultrasound with no hydronephrosis.Dialysis catheter was placed and hemodialysis initiated.Latest creatinine 9.17. Subcutaneous heparin for DVT prophylaxis. Occupational therapy evaluation completed with recommendations of physical medicine rehabilitation consult.   Review of Systems  Constitutional: Negative for chills and fever.  HENT: Negative for hearing loss.   Eyes: Negative for blurred vision and double vision.  Respiratory: Positive for shortness of breath. Negative for cough.   Cardiovascular: Positive for leg swelling. Negative for chest pain and palpitations.  Gastrointestinal: Positive for constipation. Negative for nausea and vomiting.  Genitourinary: Negative for hematuria.  Musculoskeletal: Positive for myalgias.  Skin: Negative for rash.  Neurological: Positive for weakness. Negative for seizures.  All other systems reviewed and are negative.  Past Medical History:  Diagnosis Date  . Anemia of chronic disease    /notes 04/02/2017  . CKD (chronic kidney disease), stage IV (HCC)    rin 2015/notes 04/02/2017; enal insufficiency (1.6-1.9 creatinine)  . ESRD (end stage renal disease) on dialysis [facility])    "new onset; just started dialysis 2 days ago" (04/03/2017)  . Hyperlipidemia   . Hypertension   . Thyroid disease    ? past problem  . Type II diabetes mellitus (HCC)    Past Surgical History:  Procedure Laterality Date  . AV FISTULA PLACEMENT Left 03/12/2017     Procedure: CEPHALIC VEIN THROMBECTOMY LEFT ARM and  BRACHIOCEPHALIC  ARTERIOVENOUS (AV) FISTULA CREATION LEFT ARM;  Surgeon: Maeola Harman, MD;  Location: [facility] OR;  Service: Vascular;  Laterality: Left;  . CESAREAN SECTION  1988; 1986  . CYSTECTOMY  2001   thyroid; pt denies this hx on 04/03/2017  . IR FLUORO GUIDE CV LINE RIGHT  03/31/2017  . IR US GUIDE VASC ACCESS RIGHT  03/31/2017   Family History  Problem Relation Age of Onset  . Diabetes Mother   . Heart disease Mother   . Hyperlipidemia Mother   . Cancer Father        lung, non-smoker. deceased 64yr  . Diabetes Sister   . Heart disease Brother        MI @ 32   Social History:  reports that she has been smoking Cigarettes.  She has a 7.50 pack-year smoking history. She has never used smokeless tobacco. She reports that she uses drugs, including Marijuana. She reports that she does not drink alcohol. Allergies: No Known Allergies Medications Prior to Admission  Medication Sig Dispense Refill  . acetaminophen (TYLENOL) 325 MG tablet Take 650 mg by mouth every 6 (six) hours as needed for mild pain.    Marland Kitchen amLODipine (NORVASC) 10 MG tablet Take 10 mg by mouth daily.    . cloNIDine (CATAPRES) 0.3 MG tablet Take 1 tablet (0.3 mg total) by mouth 2 (two) times daily. 60 tablet 0  . gabapentin (NEURONTIN) 300 MG capsule Take 1 capsule (300 mg total) by mouth 3 (three) times daily. 90 capsule 0  . glimepiride (AMARYL) 4 MG tablet Take 1 tablet (4 mg total) by mouth daily with breakfast. 30 tablet 0  . hydrochlorothiazide (HYDRODIURIL) 25 MG tablet  Take 25 mg by mouth daily.     . insulin detemir (LEVEMIR FLEXPEN) 100 UNIT/ML injection Inject 40 Units into the skin 2 (two) times daily. (Patient taking differently: Inject 70 Units into the skin 2 (two) times daily. ) 24 mL 0  . losartan (COZAAR) 50 MG tablet Take 1 tablet (50 mg total) by mouth daily. 30 tablet 0  . metoprolol tartrate (LOPRESSOR) 50 MG tablet Take 50 mg by mouth 2  (two) times daily.    . simvastatin (ZOCOR) 20 MG tablet Take 1 tablet (20 mg total) by mouth daily. 30 tablet 0  . sodium bicarbonate 650 MG tablet Take 1,300 mg by mouth 2 (two) times daily.    . Insulin Pen Needle 31G X 5 MM MISC Use with insulin injection two times a day. 100 each 0  . Multiple Vitamins-Minerals (MULTIPLE VITAMINS/WOMENS PO) Take 1 tablet by mouth daily.        Home: Home Living Family/patient expects to be discharged to:: Private residence Living Arrangements: Alone Available Help at Discharge: Family, Available PRN/intermittently Type of Home: Apartment Home Access: Stairs to enter [employer] of Steps: 3 Entrance Stairs-Rails: Right, Left, Can reach both Home Layout: One level Bathroom Shower/Tub: Engineer, manufacturing systems: Standard Home Equipment: None Additional Comments: Info currently for Pt's home setup; Pt reports possibility of staying with her son after discharge though with increased lethargy/difficulty answering questions, will follow up to obtain son's home setup information   Functional History: Prior Function Level of Independence: Independent Comments: Pt works two jobs, was driving  Functional Status:  Mobility: Bed Mobility General bed mobility comments: Pt seated EOB upon arrival  Transfers Overall transfer level: Needs assistance Equipment used: Rolling walker (2 wheeled), None Transfers: Sit to/from Stand, [employer] Transfers Sit to Stand: Max assist, Mod assist, +2 physical assistance, +2 safety/equipment Stand pivot transfers: Mod assist, +2 physical assistance, +2 safety/equipment General transfer comment: Pt completed sit<>stand x3; initially with +2 HHA with MaxA however Pt demonstrating bil knee buckling and returned to sitting EOB, completed additional sit<>stand with use of RW with ModA +2, ModA +2 for stand pivot to recliner; cues for hand placement and assist to boost into standing      ADL: ADL Overall  ADL's : Needs assistance/impaired Eating/Feeding: Set up, Sitting Grooming: Set up, Sitting Upper Body Bathing: Min guard, Sitting Lower Body Bathing: Moderate assistance, Sit to/from stand Upper Body Dressing : Min guard, Sitting Lower Body Dressing: Maximal assistance, Sit to/from stand Toilet Transfer: Moderate assistance, +2 for physical assistance, +2 for safety/equipment, Stand-pivot, BSC, RW Toileting- Clothing Manipulation and Hygiene: Maximal assistance, Sit to/from stand Functional mobility during ADLs: Moderate assistance, +2 for physical assistance, +2 for safety/equipment, Rolling walker General ADL Comments: Attempted room level functional mobility however Pt demonstrating bil LE buckling upon attempts at mobility, required +2 Modassist to safely return to sitting EOB after mobility attempt (however Pt reports having previously been up to [facility] and ambulating to bathroom); Pt completed stand pivot to recliner using RW with ModA +2 where she was set up for grooming ADLs and breakfast   Cognition: Cognition Overall Cognitive Status: Within Functional Limits for tasks assessed Orientation Level: Oriented X4 Cognition Arousal/Alertness: Lethargic Behavior During Therapy: WFL for tasks assessed/performed Overall Cognitive Status: Within Functional Limits for tasks assessed General Comments: Pt with increased lethargy as session progressed, difficulty keeping eyes open end of session when answering therapist's questions   Blood pressure (!) 157/83, pulse 82, temperature 98.6 F (37 C),  temperature source Oral, resp. rate 18, height 5\' 5"  (1.651 m), weight 117.8 kg (259 lb 11.2 oz), last menstrual period 03/26/2017, SpO2 96 %. Physical Exam  Vitals reviewed. Constitutional: She is oriented to person, place, and time.  HENT:  Head: Normocephalic.  Eyes: EOM are normal.  Neck: Normal range of motion. Neck supple. No thyromegaly present.  Cardiovascular: Normal rate, regular rhythm  and normal heart sounds.   Respiratory: Effort normal and breath sounds normal. No respiratory distress.  GI: Soft. Bowel sounds are normal. She exhibits no distension.  Neurological: She is oriented to person, place, and time.  Lethargic but arousable. Moves all 4's. Decreased LT in both distal LE's.   Skin: Skin is warm and dry.    Results for orders placed or performed during the hospital encounter of 03/25/2017 (from the past 24 hour(s))  Glucose, capillary     Status: None   Collection Time: 03/22/2017 12:08 PM  Result Value Ref Range   Glucose-Capillary 93 65 - 99 mg/dL  Glucose, capillary     Status: None   Collection Time: 04/08/2017  1:18 PM  Result Value Ref Range   Glucose-Capillary 92 65 - 99 mg/dL  Glucose, capillary     Status: Abnormal   Collection Time: 04/04/2017  9:46 PM  Result Value Ref Range   Glucose-Capillary 121 (H) 65 - 99 mg/dL  Glucose, capillary     Status: Abnormal   Collection Time: 04/06/17  7:48 AM  Result Value Ref Range   Glucose-Capillary 128 (H) 65 - 99 mg/dL  Glucose, capillary     Status: Abnormal   Collection Time: 04/06/17 11:07 AM  Result Value Ref Range   Glucose-Capillary 193 (H) 65 - 99 mg/dL   Dg Tibia/fibula Left  Result Date: 04/11/2017 CLINICAL DATA:  Bilateral leg pain for more than 1 month. No known injury. EXAM: LEFT TIBIA AND FIBULA - 2 VIEW COMPARISON:  None. FINDINGS: Diffuse subcutaneous edema.  Normal appearing bones. IMPRESSION: Diffuse subcutaneous edema with no underlying bony abnormality. Electronically Signed   By: Beckie Salts M.D.   On: 03/18/2017 23:20   Dg Tibia/fibula Right  Result Date: 04/04/2017 CLINICAL DATA:  Bilateral leg pain for more than 1 month. No known injury. EXAM: RIGHT TIBIA AND FIBULA - 2 VIEW COMPARISON:  None. FINDINGS: Mild diffuse subcutaneous edema. Normal appearing tibia and fibula. Moderate-sized calcaneal spurs. IMPRESSION: Mild diffuse subcutaneous edema without underlying bony abnormality.  Electronically Signed   By: Beckie Salts M.D.   On: 04/09/2017 23:21    Assessment/Plan: Diagnosis: Debility related to ESRD/muliple medical. 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 Does the need for close, 24 hr/day medical supervision in concert with the patient's rehab needs make it unreasonable for this patient to be served in a less intensive setting? Yes 2. Co-Morbidities requiring supervision/potential complications: ESRD, HTN, DM, pain mgt 3. Due to bladder management, bowel management, safety, skin/wound care, disease management, medication administration, pain management and patient education, does the patient require 24 hr/day rehab nursing? Yes 4. Does the patient require coordinated care of a physician, rehab nurse, PT (1-2 hrs/day, 5 days/week) and OT (1-2 hrs/day, 5 days/week) to address physical and functional deficits in the context of the above medical diagnosis(es)? Yes Addressing deficits in the following areas: balance, endurance, locomotion, strength, transferring, bowel/bladder control, bathing, dressing, feeding, grooming and toileting 5. Can the patient actively participate in an intensive therapy program of at least 3 hrs of therapy per day at least 5  days per week? Potentially 6. The potential for patient to make measurable gains while on inpatient rehab is good 7. Anticipated functional outcomes upon discharge from inpatient rehab are modified independent  with PT, modified independent with OT, n/a with SLP. 8. Estimated rehab length of stay to reach the above functional goals is: 10-16 days 9. Anticipated D/C setting: Home 10. Anticipated post D/C treatments: HH therapy and Outpatient therapy 11. Overall Rehab/Functional Prognosis: excellent  RECOMMENDATIONS: This patient's condition is appropriate for continued rehabilitative care in the following setting: CIR Patient has agreed to participate in recommended program. Yes Note that insurance prior  authorization may be required for reimbursement for recommended care.  Comment: Rehab Admissions Coordinator to follow up.  Thanks,  Ranelle OysterZachary T. Swartz, MD, [place] DomFAAPMR    Charlton AmorANGIULLI,DANIEL J., PA-C 04/06/2017

## 2017-04-06 NOTE — Progress Notes (Signed)
S: Continues to describe leg pain today, denies new concerns   O:BP (!) 157/83 (BP Location: Right Arm)   Pulse 82   Temp 98.6 F (37 C) (Oral)   Resp 18   Ht 5\' 5"  (1.651 m)   Wt 259 lb 11.2 oz (117.8 kg)   LMP 03/26/2017   SpO2 96%   BMI 43.22 kg/m   Intake/Output Summary (Last 24 hours) at 04/06/17 1040 Last data filed at 04/06/17 0600  Gross per 24 hour  Intake               60 ml  Output             4025 ml  Net            -3965 ml   Intake/Output: I/O last 3 completed shifts: In: 180 [P.O.:180] Out: 4025 [Other:4000; Blood:25]  Intake/Output this shift:  No intake/output data recorded. Weight change: -1.3 oz (-0.036 kg) Gen: no acute distress , obese, sleepy CVS: RRR, systolic murmur loudest over apex  Resp: lungs are clear Abd: soft, non tender, non distended  Ext: 2+ lower extremity edema    Recent Labs Lab 04/04/2017 1937 03/31/17 0215 03/31/17 1758 04/01/17 0046 04/02/17 2100 04/03/17 1536 04/04/2017 0353  NA 136 137  137 137 135 137 138 142  K 6.1* 5.7*  5.7* 7.2* 5.1 4.7 4.5 4.4  CL 110 110  109 108 104 105 103 103  CO2 13* 15*  16* 16* 18* 19* 22 24  GLUCOSE 166* 147*  147* 109* 79 118* 150* 96  BUN 89* 89*  90* 92* 68* 77* 49* 44*  CREATININE 13.26* 13.13*  12.99* 13.13* 10.73* 12.20* 9.22* 9.17*  ALBUMIN 2.8* 2.5* 2.6*  --  2.4* 2.5*  --   CALCIUM 8.1* 8.1*  7.9* 7.9* 8.1* 8.1* 8.5* 9.3  PHOS  --  8.8* 8.6*  --  7.6* 5.5*  --   AST 11*  --   --   --   --   --   --   ALT 11*  --   --   --   --   --   --    Liver Function Tests:  Recent Labs Lab 04/03/2017 1937  03/31/17 1758 04/02/17 2100 04/03/17 1536  AST 11*  --   --   --   --   ALT 11*  --   --   --   --   ALKPHOS 118  --   --   --   --   BILITOT 0.8  --   --   --   --   PROT 6.8  --   --   --   --   ALBUMIN 2.8*  < > 2.6* 2.4* 2.5*  < > = values in this interval not displayed. No results for input(s): LIPASE, AMYLASE in the last 168 hours. No results for input(s): AMMONIA  in the last 168 hours. CBC:  Recent Labs Lab 03/20/2017 1937  04/01/17 0046 04/02/17 2154 04/03/17 1536 04/04/17 0206 04/08/2017 0353  WBC 12.2*  < > 11.6* 12.6* 14.5* 15.1* 15.8*  NEUTROABS 9.1*  --  9.0*  --   --   --   --   HGB 8.2*  < > 7.9* 7.1* 7.6* 7.8* 7.6*  HCT 26.3*  < > 24.6* 21.9* 23.7* 24.6* 25.1*  MCV 92.9  < > 92.1 89.8 91.2 91.1 93.0  PLT 281  < > 267 287 310 315 324  < > =  values in this interval not displayed. Cardiac Enzymes: No results for input(s): CKTOTAL, CKMB, CKMBINDEX, TROPONINI in the last 168 hours. CBG:  Recent Labs Lab 04/10/2017 0816 03/27/2017 1208 03/19/2017 1318 03/29/2017 2146 04/06/17 0748  GLUCAP 105* 93 92 121* 128*    Iron Studies: No results for input(s): IRON, TIBC, TRANSFERRIN, FERRITIN in the last 72 hours. Studies/Results: Dg Tibia/fibula Left  Result Date: 03/22/2017 CLINICAL DATA:  Bilateral leg pain for more than 1 month. No known injury. EXAM: LEFT TIBIA AND FIBULA - 2 VIEW COMPARISON:  None. FINDINGS: Diffuse subcutaneous edema.  Normal appearing bones. IMPRESSION: Diffuse subcutaneous edema with no underlying bony abnormality. Electronically Signed   By: Beckie SaltsSteven  Reid M.D.   On: 03/26/2017 23:20   Dg Tibia/fibula Right  Result Date: 04/01/2017 CLINICAL DATA:  Bilateral leg pain for more than 1 month. No known injury. EXAM: RIGHT TIBIA AND FIBULA - 2 VIEW COMPARISON:  None. FINDINGS: Mild diffuse subcutaneous edema. Normal appearing tibia and fibula. Moderate-sized calcaneal spurs. IMPRESSION: Mild diffuse subcutaneous edema without underlying bony abnormality. Electronically Signed   By: Beckie SaltsSteven  Reid M.D.   On: 04/04/2017 23:21   . amLODipine  10 mg Oral QHS  . calcitRIOL  0.5 mcg Oral QODAY  . darbepoetin (ARANESP) injection - DIALYSIS  200 mcg Intravenous Q Mon-HD  . gabapentin  300 mg Oral Daily  . heparin subcutaneous  5,000 Units Subcutaneous Q8H  . insulin aspart  0-15 Units Subcutaneous TID WC  . insulin detemir  32 Units  Subcutaneous BID  . labetalol  200 mg Oral BID  . lanthanum  1,000 mg Oral TID WC  . methocarbamol  500 mg Oral TID  . multivitamin  1 tablet Oral QHS  . simvastatin  20 mg Oral Daily    BMET    Component Value Date/Time   NA 142 03/17/2017 0353   K 4.4 04/10/2017 0353   CL 103 04/10/2017 0353   CO2 24 03/17/2017 0353   GLUCOSE 96 03/16/2017 0353   BUN 44 (H) 04/11/2017 0353   CREATININE 9.17 (H) 03/24/2017 0353   CREATININE 1.40 (H) 07/21/2011 1429   CALCIUM 9.3 03/22/2017 0353   GFRNONAA 4 (L) 04/06/2017 0353   GFRAA 5 (L) 03/18/2017 0353   CBC    Component Value Date/Time   WBC 15.8 (H) 03/18/2017 0353   RBC 2.70 (L) 04/01/2017 0353   HGB 7.6 (L) 03/31/2017 0353   HCT 25.1 (L) 04/06/2017 0353   PLT 324 03/22/2017 0353   MCV 93.0 03/27/2017 0353   MCH 28.1 03/12/2017 0353   MCHC 30.3 03/17/2017 0353   RDW 14.5 04/11/2017 0353   LYMPHSABS 1.4 04/01/2017 0046   MONOABS 0.7 04/01/2017 0046   EOSABS 0.5 04/01/2017 0046   BASOSABS 0.0 04/01/2017 0046     Assessment/Plan:  1. ESRD - CLIP process is complete and she now has permanent access. Ready for discharge from a renal standpoint.  2. Leg pain - Will need to further follow up outpatient. Calciphylaxis is a possibility but will need a skin biopsy to definitively diagnose this.  3. HTN - blood pressure improves with HD and amlodipine, continue amlodipine at discharge  4. Anemia - cont ESA and IV iron with HD, repeat Tsat in 1 month  5. Mineral metabolic disorder - continue lanthanum at discharge and calcitriol  6. DM - continue insulin at discharge  7. Non adherance - will need continued encouragement  8. Obesity - will need continued encouragement  9. Hyperlipidemia - continue  simvastatin   Eulah Pont  I have seen and examined this patient and agree with the plan of care seen, eval, examined, counseled,discussed with  Resident. .  Sheyenne Konz L 04/06/2017, 1:47 PM

## 2017-04-06 NOTE — Care Management (Signed)
04/06/2017 Informed by Student nurse that this pt was called in her room and informed that her insurance would not cover HD. This CM contacted HD scheduler, Reesa ChewSheila Richardson in HD center who states that she is unaware of a problem with the insurance but will investigate this issue with the patient. .Marland Kitchen

## 2017-04-06 NOTE — Care Management Note (Signed)
Case Management Note  Patient Details  Name: Teresa KayserDiahann Tufano V. MRN: [id number] Date of Birth: 15-Feb-1966  Subjective/Objective:     CM following for progression and d/c planning.                Action/Plan: 04/06/2017 Noted PT notes and need for PT/OT. Pt being followed by CIR nurse coordinator , Ottie GlazierBarbara Boyette, who has explored pt insurance and informed pt of large copay requirements. No bed available on CIR at this time.  Await DME recommendations from PT/OT if pt is not able to d/c to CIR.  Expected Discharge Date:                  Expected Discharge Plan:  IP Rehab Facility  In-House Referral:  NA  Discharge planning Services  CM Consult  Post Acute Care Choice:    Choice offered to:     DME Arranged:    DME Agency:     HH Arranged:    HH Agency:     Status of Service:  In process, will continue to follow  If discussed at Long Length of Stay Meetings, dates discussed:    Additional Comments:  Starlyn SkeansRoyal, Loy Mccartt U, RN 04/06/2017, 3:33 PM

## 2017-04-06 NOTE — Progress Notes (Signed)
  Progress Note    04/06/2017 8:22 AM 1 Day Post-Op  Subjective:  Only complains of leg pain  Vitals:   03/16/2017 2148 04/06/17 0427  BP: 101/77 (!) 155/60  Pulse: 96 91  Resp: 18 19  Temp: 99.5 F (37.5 C) 98.9 F (37.2 C)  SpO2: 93% 91%    Physical Exam: aaox3 Palpable thrill in left upper arm and 2+ left radial pulse  CBC    Component Value Date/Time   WBC 15.8 (H) 04/03/2017 0353   RBC 2.70 (L) 04/02/2017 0353   HGB 7.6 (L) 03/16/2017 0353   HCT 25.1 (L) 03/29/2017 0353   PLT 324 04/04/2017 0353   MCV 93.0 03/12/2017 0353   MCH 28.1 04/04/2017 0353   MCHC 30.3 03/28/2017 0353   RDW 14.5 04/03/2017 0353   LYMPHSABS 1.4 04/01/2017 0046   MONOABS 0.7 04/01/2017 0046   EOSABS 0.5 04/01/2017 0046   BASOSABS 0.0 04/01/2017 0046    BMET    Component Value Date/Time   NA 142 03/19/2017 0353   K 4.4 04/04/2017 0353   CL 103 04/03/2017 0353   CO2 24 04/03/2017 0353   GLUCOSE 96 03/31/2017 0353   BUN 44 (H) 04/01/2017 0353   CREATININE 9.17 (H) 03/27/2017 0353   CREATININE 1.40 (H) 07/21/2011 1429   CALCIUM 9.3 04/03/2017 0353   GFRNONAA 4 (L) 03/21/2017 0353   GFRAA 5 (L) 04/02/2017 0353    INR    Component Value Date/Time   INR 1.21 03/15/2017 0353     Intake/Output Summary (Last 24 hours) at 04/06/17 [id number] Last data filed at 04/06/17 0600  Gross per 24 hour  Intake               60 ml  Output             4025 ml  Net            -3965 ml     Assessment:  51 y.o. female is pod#1 left brachiocephalic avf  Plan: F/u in 6 weeks with dialysis access duplex   Rickell Wiehe C. Randie Heinzain, MD [facility] [place] Office: [phone] Pager: [phone]  04/06/2017 8:22 AM

## 2017-04-07 LAB — GLUCOSE, CAPILLARY
GLUCOSE-CAPILLARY: 110 mg/dL — AB (ref 65–99)
GLUCOSE-CAPILLARY: 114 mg/dL — AB (ref 65–99)
GLUCOSE-CAPILLARY: 130 mg/dL — AB (ref 65–99)
Glucose-Capillary: 94 mg/dL (ref 65–99)

## 2017-04-07 NOTE — Progress Notes (Signed)
PROGRESS NOTE    Teresa Dalton  WUJ:[id number] DOB: [birth year] DOA: 2017-04-18 PCP: Patient, No Pcp Per   Brief Narrative: 51 year old female with history of hypertension, type 2 diabetes, chronic kidney disease stage IV in 2015 who hasn't seen nephrology since presented with bilateral foot pain associated with lower extremity edema.  Assessment & Plan:  # New onset ESRD: -patient has been receiving hemodialysis via Tunneled catheter, tolerating well.  -s/p AV fistula by vascular surgeon on 10/25. -Monitor BMP and electrolytes. -Outpatient hemodialysis arranged.  #Hypertension: Monitor blood pressure. Continue amlodipine, labetalol.  #Peripheral neuropathy pain/b/l lower leg pain: Patient reported with b/l leg pain. She said she has pain going on for more than a month. There is small superficial black colored wound ? 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  -doppler nagative for DVT -Continue neurontin and robaxin. The dose of neurontin reduced to adjust ESRD -X-ray with subcutaneous edema. ABI vascular study incomplete. -PT/OT recommended CIR. CIR evaluation ongoing for safe discharge. -continue supportive care  #Anemia of chronic kidney disease: Continue IV iron and PSA during dialysis. Monitor CBC  #Type 2 diabetes: Continue current insulin regimen. Monitor blood sugar level.blood sugar level acceptable.  #Obesity: Dietary adjustment and healthy lipase  #Hyperlipidemia: Continue statin   #mild temperature; Patient is afebrile now. Chest x-ray unremarkable. Monitor temperature, Tylenol as needed.  DVT prophylaxis:heparin subcutaneous Code Status:full code Family Communication:no family at bedside Disposition Plan:currently admitted    Consultants:   Nephrologist  Vascular surgeon  Procedures:tunneled catheter placement AVF creation on 10/25 Antimicrobials:none  Subjective: Bilateral foot pain is stable. No new event. Waiting for inpatient rehabilitation. Denied  chest pain, shortness of breath, nausea or vomiting.  Objective: Vitals:   04/06/17 1538 04/06/17 2116 04/07/17 0442 04/07/17 0835  BP: (!) 132/54 (!) 137/56 (!) 131/53 (!) 160/51  Pulse: 94 92 74 78  Resp:   16 16  Temp: (!) 100.6 F (38.1 C) 100 F (37.8 C) 99.5 F (37.5 C) 98.3 F (36.8 C)  TempSrc: Oral Oral Oral Oral  SpO2: 96% 92% 92% 91%  Weight:  117.1 kg (258 lb 2.5 oz)    Height:        Intake/Output Summary (Last 24 hours) at 04/07/17 1153 Last data filed at 04/07/17 0600  Gross per 24 hour  Intake              700 ml  Output                0 ml  Net              700 ml   Filed Weights   04/07/2017 1848 04/09/2017 2148 04/06/17 2116  Weight: 117.6 kg (259 lb 4.2 oz) 117.8 kg (259 lb 11.2 oz) 117.1 kg (258 lb 2.5 oz)    Examination:  General exam: lying on bed comfortable, not in distress. Respiratory system: there are bilateral, no wheezing or crackle Cardiovascular system: Regular rate rhythm, S1-S2 normal. Gastrointestinal system: Abdomen is nondistended, soft and nontender. Normal bowel sounds heard. Central nervous system: Alert and oriented. No focal neurological deficits. Extremities: b/l LE pitting edema, pedal pulse palpable, superficial wound on LLE.unchanged Skin: No rashes, lesions or ulcers Psychiatry: Judgement and insight appear normal. Mood & affect appropriate.     Data Reviewed: I have personally reviewed following labs and imaging studies  CBC:  Recent Labs Lab 04/01/17 0046 04/02/17 2154 04/03/17 1536 04/04/17 0206 03/20/2017 0353  WBC 11.6* 12.6* 14.5* 15.1* 15.8*  NEUTROABS 9.0*  --   --   --   --  HGB 7.9* 7.1* 7.6* 7.8* 7.6*  HCT 24.6* 21.9* 23.7* 24.6* 25.1*  MCV 92.1 89.8 91.2 91.1 93.0  PLT 267 287 310 315 324   Basic Metabolic Panel:  Recent Labs Lab 03/31/17 1758 04/01/17 0046 04/02/17 2100 04/03/17 1536 03/24/2017 0353  NA 137 135 137 138 142  K 7.2* 5.1 4.7 4.5 4.4  CL 108 104 105 103 103  CO2 16* 18* 19* 22  24  GLUCOSE 109* 79 118* 150* 96  BUN 92* 68* 77* 49* 44*  CREATININE 13.13* 10.73* 12.20* 9.22* 9.17*  CALCIUM 7.9* 8.1* 8.1* 8.5* 9.3  PHOS 8.6*  --  7.6* 5.5*  --    GFR: Estimated Creatinine Clearance: 9.6 mL/min (A) (by C-G formula based on SCr of 9.17 mg/dL (H)). Liver Function Tests:  Recent Labs Lab 03/31/17 1758 04/02/17 2100 04/03/17 1536  ALBUMIN 2.6* 2.4* 2.5*   No results for input(s): LIPASE, AMYLASE in the last 168 hours. No results for input(s): AMMONIA in the last 168 hours. Coagulation Profile:  Recent Labs Lab 04/10/2017 0353  INR 1.21   Cardiac Enzymes: No results for input(s): CKTOTAL, CKMB, CKMBINDEX, TROPONINI in the last 168 hours. BNP (last 3 results) No results for input(s): PROBNP in the last 8760 hours. HbA1C: No results for input(s): HGBA1C in the last 72 hours. CBG:  Recent Labs Lab 04/06/17 0748 04/06/17 1107 04/06/17 1709 04/06/17 2109 04/07/17 0750  GLUCAP 128* 193* 120* 130* 110*   Lipid Profile: No results for input(s): CHOL, HDL, LDLCALC, TRIG, CHOLHDL, LDLDIRECT in the last 72 hours. Thyroid Function Tests: No results for input(s): TSH, T4TOTAL, FREET4, T3FREE, THYROIDAB in the last 72 hours. Anemia Panel: No results for input(s): VITAMINB12, FOLATE, FERRITIN, TIBC, IRON, RETICCTPCT in the last 72 hours. Sepsis Labs: No results for input(s): PROCALCITON, LATICACIDVEN in the last 168 hours.  Recent Results (from the past 240 hour(s))  MRSA PCR Screening     Status: None   Collection Time: 04/01/17  2:45 PM  Result Value Ref Range Status   MRSA by PCR NEGATIVE NEGATIVE Final    Comment:        The GeneXpert MRSA Assay (FDA approved for NASAL specimens only), is one component of a comprehensive MRSA colonization surveillance program. It is not intended to diagnose MRSA infection nor to guide or monitor treatment for MRSA infections.   Culture, blood (Routine X 2) w Reflex to ID Panel     Status: None (Preliminary  result)   Collection Time: 04/03/17  7:01 PM  Result Value Ref Range Status   Specimen Description BLOOD LEFT WRIST  Final   Special Requests   Final    BOTTLES DRAWN AEROBIC AND ANAEROBIC Blood Culture adequate volume   Culture NO GROWTH 4 DAYS  Final   Report Status PENDING  Incomplete  Culture, blood (Routine X 2) w Reflex to ID Panel     Status: None (Preliminary result)   Collection Time: 04/03/17  7:01 PM  Result Value Ref Range Status   Specimen Description BLOOD RIGHT ANTECUBITAL  Final   Special Requests   Final    BOTTLES DRAWN AEROBIC AND ANAEROBIC Blood Culture results may not be optimal due to an excessive volume of blood received in culture bottles   Culture NO GROWTH 4 DAYS  Final   Report Status PENDING  Incomplete  Surgical pcr screen     Status: None   Collection Time: 03/27/2017  7:43 AM  Result Value Ref Range Status   MRSA,  PCR NEGATIVE NEGATIVE Final   Staphylococcus aureus NEGATIVE NEGATIVE Final    Comment: (NOTE) The Xpert SA Assay (FDA approved for NASAL specimens in patients 97 years of age and older), is one component of a comprehensive surveillance program. It is not intended to diagnose infection nor to guide or monitor treatment.          Radiology Studies: Dg Tibia/fibula Left  Result Date: 03/22/2017 CLINICAL DATA:  Bilateral leg pain for more than 1 month. No known injury. EXAM: LEFT TIBIA AND FIBULA - 2 VIEW COMPARISON:  None. FINDINGS: Diffuse subcutaneous edema.  Normal appearing bones. IMPRESSION: Diffuse subcutaneous edema with no underlying bony abnormality. Electronically Signed   By: Beckie Salts M.D.   On: 03/24/2017 23:20   Dg Tibia/fibula Right  Result Date: 03/15/2017 CLINICAL DATA:  Bilateral leg pain for more than 1 month. No known injury. EXAM: RIGHT TIBIA AND FIBULA - 2 VIEW COMPARISON:  None. FINDINGS: Mild diffuse subcutaneous edema. Normal appearing tibia and fibula. Moderate-sized calcaneal spurs. IMPRESSION: Mild diffuse  subcutaneous edema without underlying bony abnormality. Electronically Signed   By: Beckie Salts M.D.   On: 03/19/2017 23:21        Scheduled Meds: . amLODipine  10 mg Oral QHS  . calcitRIOL  0.5 mcg Oral QODAY  . darbepoetin (ARANESP) injection - DIALYSIS  200 mcg Intravenous Q Mon-HD  . gabapentin  300 mg Oral Daily  . heparin subcutaneous  5,000 Units Subcutaneous Q8H  . insulin aspart  0-15 Units Subcutaneous TID WC  . insulin detemir  32 Units Subcutaneous BID  . labetalol  200 mg Oral BID  . lanthanum  1,000 mg Oral TID WC  . methocarbamol  500 mg Oral TID  . multivitamin  1 tablet Oral QHS  . simvastatin  20 mg Oral Daily   Continuous Infusions: . sodium chloride 10 mL/hr at 03/22/2017 0844  . ferric gluconate (FERRLECIT/NULECIT) IV 125 mg (03/27/2017 1748)     LOS: 8 days    Richie Vadala Jaynie Collins, MD [facility] Pager [phone]  If 7PM-7AM, please contact night-coverage [email or web address] Password [email or web address] 04/07/2017, 11:53 AM

## 2017-04-07 NOTE — Progress Notes (Signed)
S:51 year old female with history of hypertension, type 2 diabetes, chronic kidney disease stage IV in 2015 who hasn't seen nephrology since presented with bilateral foot pain associated with lower extremity edema.now recently started on dialysis.  Patient was feeling better when seen this morning,still having bilateral leg pain.  O:BP (!) 112/50 (BP Location: Right Arm)   Pulse 72   Temp 98.5 F (36.9 C) (Oral)   Resp 15   Ht 5\' 5"  (1.651 m)   Wt 258 lb 2.5 oz (117.1 kg)   LMP 03/26/2017   SpO2 95%   BMI 42.96 kg/m   Intake/Output Summary (Last 24 hours) at 04/07/17 1651 Last data filed at 04/07/17 0600  Gross per 24 hour  Intake              220 ml  Output                0 ml  Net              220 ml   Intake/Output: I/O last 3 completed shifts: In: 1000 [P.O.:1000] Out: 0   Intake/Output this shift:  No intake/output data recorded. Weight change: -9 lb 13.5 oz (-4.464 kg)   WUJ:[id number], obese lady, in no acute distress. CVS: regular rate and rhythm. Resp: clear bilaterally.decreased bs Abd: soft, non tender, obese, bowel sounds positive. Liver downt 6 cm Ext: 2+ lower extremity edema with a small superficial black color ulcer on LLE. Irreg edges around eschar. New aVF LUA, PC R IJ   Recent Labs Lab 03/31/17 1758 04/01/17 0046 04/02/17 2100 04/03/17 1536 03/14/2017 0353  NA 137 135 137 138 142  K 7.2* 5.1 4.7 4.5 4.4  CL 108 104 105 103 103  CO2 16* 18* 19* 22 24  GLUCOSE 109* 79 118* 150* 96  BUN 92* 68* 77* 49* 44*  CREATININE 13.13* 10.73* 12.20* 9.22* 9.17*  ALBUMIN 2.6*  --  2.4* 2.5*  --   CALCIUM 7.9* 8.1* 8.1* 8.5* 9.3  PHOS 8.6*  --  7.6* 5.5*  --    Liver Function Tests:  Recent Labs Lab 03/31/17 1758 04/02/17 2100 04/03/17 1536  ALBUMIN 2.6* 2.4* 2.5*   No results for input(s): LIPASE, AMYLASE in the last 168 hours. No results for input(s): AMMONIA in the last 168 hours. CBC:  Recent Labs Lab 04/01/17 0046 04/02/17 2154  04/03/17 1536 04/04/17 0206 04/08/2017 0353  WBC 11.6* 12.6* 14.5* 15.1* 15.8*  NEUTROABS 9.0*  --   --   --   --   HGB 7.9* 7.1* 7.6* 7.8* 7.6*  HCT 24.6* 21.9* 23.7* 24.6* 25.1*  MCV 92.1 89.8 91.2 91.1 93.0  PLT 267 287 310 315 324   Cardiac Enzymes: No results for input(s): CKTOTAL, CKMB, CKMBINDEX, TROPONINI in the last 168 hours. CBG:  Recent Labs Lab 04/06/17 1709 04/06/17 2109 04/07/17 0750 04/07/17 1224 04/07/17 1642  GLUCAP 120* 130* 110* 94 114*    Iron Studies: No results for input(s): IRON, TIBC, TRANSFERRIN, FERRITIN in the last 72 hours. Studies/Results: Dg Tibia/fibula Left  Result Date: 03/26/2017 CLINICAL DATA:  Bilateral leg pain for more than 1 month. No known injury. EXAM: LEFT TIBIA AND FIBULA - 2 VIEW COMPARISON:  None. FINDINGS: Diffuse subcutaneous edema.  Normal appearing bones. IMPRESSION: Diffuse subcutaneous edema with no underlying bony abnormality. Electronically Signed   By: Beckie Salts M.D.   On: 03/30/2017 23:20   Dg Tibia/fibula Right  Result Date: 03/21/2017 CLINICAL DATA:  Bilateral leg pain for more  than 1 month. No known injury. EXAM: RIGHT TIBIA AND FIBULA - 2 VIEW COMPARISON:  None. FINDINGS: Mild diffuse subcutaneous edema. Normal appearing tibia and fibula. Moderate-sized calcaneal spurs. IMPRESSION: Mild diffuse subcutaneous edema without underlying bony abnormality. Electronically Signed   By: Beckie SaltsSteven  Reid M.D.   On: 03/25/2017 23:21   . amLODipine  10 mg Oral QHS  . calcitRIOL  0.5 mcg Oral QODAY  . darbepoetin (ARANESP) injection - DIALYSIS  200 mcg Intravenous Q Mon-HD  . gabapentin  300 mg Oral Daily  . heparin subcutaneous  5,000 Units Subcutaneous Q8H  . insulin aspart  0-15 Units Subcutaneous TID WC  . insulin detemir  32 Units Subcutaneous BID  . labetalol  200 mg Oral BID  . lanthanum  1,000 mg Oral TID WC  . methocarbamol  500 mg Oral TID  . multivitamin  1 tablet Oral QHS  . simvastatin  20 mg Oral Daily     BMET    Component Value Date/Time   NA 142 03/23/2017 0353   K 4.4 03/21/2017 0353   CL 103 04/08/2017 0353   CO2 24 03/14/2017 0353   GLUCOSE 96 04/10/2017 0353   BUN 44 (H) 03/23/2017 0353   CREATININE 9.17 (H) 03/24/2017 0353   CREATININE 1.40 (H) 07/21/2011 1429   CALCIUM 9.3 04/07/2017 0353   GFRNONAA 4 (L) 03/31/2017 0353   GFRAA 5 (L) 03/26/2017 0353   CBC    Component Value Date/Time   WBC 15.8 (H) 03/15/2017 0353   RBC 2.70 (L) 04/07/2017 0353   HGB 7.6 (L) 04/07/2017 0353   HCT 25.1 (L) 03/21/2017 0353   PLT 324 04/08/2017 0353   MCV 93.0 04/04/2017 0353   MCH 28.1 03/17/2017 0353   MCHC 30.3 03/14/2017 0353   RDW 14.5 04/06/2017 0353   LYMPHSABS 1.4 04/01/2017 0046   MONOABS 0.7 04/01/2017 0046   EOSABS 0.5 04/01/2017 0046   BASOSABS 0.0 04/01/2017 0046     Assessment/Plan:  1. ESRD - CLIP process is complete and she now has permanent access.going for dialysis today. Physical therapy is recommending CIR, we will follow if patient goes to CIR. HD today with cath  2. Leg pain - Will need to further follow up outpatient. Calciphylaxis is a possibility but will need a skin biopsy to definitively diagnose this.   3. HTN - blood pressure improves with HD and amlodipine, continue amlodipine at discharge   4. Anemia - cont ESA and IV iron with HD, repeat Tsat in 1 month   5. Mineral metabolic disorder - continue lanthanum at discharge and calcitriol   6. DM - continue insulin at discharge  Good control  7. Non adherance - will need continued encouragement   8. Obesity - will need continued encouragement   9. Hyperlipidemia - continue simvastatin    I have seen and examined this patient and agree with the plan of care seen , eval, counseled patient, and discussed with resident .  Shubh Chiara L 04/07/2017, 5:28 PM

## 2017-04-08 DIAGNOSIS — N189 Chronic kidney disease, unspecified: Secondary | ICD-10-CM

## 2017-04-08 DIAGNOSIS — D631 Anemia in chronic kidney disease: Secondary | ICD-10-CM

## 2017-04-08 LAB — GLUCOSE, CAPILLARY
GLUCOSE-CAPILLARY: 144 mg/dL — AB (ref 65–99)
GLUCOSE-CAPILLARY: 92 mg/dL (ref 65–99)
GLUCOSE-CAPILLARY: 125 mg/dL — AB (ref 65–99)

## 2017-04-08 LAB — CULTURE, BLOOD (ROUTINE X 2)
CULTURE: NO GROWTH
Culture: NO GROWTH
Special Requests: ADEQUATE

## 2017-04-08 LAB — CBC
HCT: 23.6 % — ABNORMAL LOW (ref 36.0–46.0)
Hemoglobin: 7.2 g/dL — ABNORMAL LOW (ref 12.0–15.0)
MCH: 28.8 pg (ref 26.0–34.0)
MCHC: 30.5 g/dL (ref 30.0–36.0)
MCV: 94.4 fL (ref 78.0–100.0)
PLATELETS: 264 10*3/uL (ref 150–400)
RBC: 2.5 MIL/uL — AB (ref 3.87–5.11)
RDW: 14.5 % (ref 11.5–15.5)
WBC: 16.3 10*3/uL — AB (ref 4.0–10.5)

## 2017-04-08 LAB — RENAL FUNCTION PANEL
ALBUMIN: 2.5 g/dL — AB (ref 3.5–5.0)
Anion gap: 15 (ref 5–15)
BUN: 48 mg/dL — ABNORMAL HIGH (ref 6–20)
CALCIUM: 9 mg/dL (ref 8.9–10.3)
CHLORIDE: 97 mmol/L — AB (ref 101–111)
CO2: 24 mmol/L (ref 22–32)
CREATININE: 10.51 mg/dL — AB (ref 0.44–1.00)
GFR, EST AFRICAN AMERICAN: 4 mL/min — AB (ref >60)
GFR, EST NON AFRICAN AMERICAN: 4 mL/min — AB (ref >60)
Glucose, Bld: 95 mg/dL (ref 65–99)
PHOSPHORUS: 7.8 mg/dL — AB (ref 2.5–4.6)
Potassium: 4.9 mmol/L (ref 3.5–5.1)
Sodium: 136 mmol/L (ref 135–145)

## 2017-04-08 MED ORDER — LIDOCAINE-PRILOCAINE 2.5-2.5 % EX CREA: 1.0000 "application " | TOPICAL_CREAM | CUTANEOUS | Status: DC | PRN

## 2017-04-08 MED ORDER — HYDROCODONE-ACETAMINOPHEN 5-325 MG PO TABS
1.0000 | ORAL_TABLET | Freq: Once | ORAL | Status: AC
  Administered 2017-04-08: 1 via ORAL
  Filled 2017-04-08: qty 1

## 2017-04-08 MED ORDER — LIDOCAINE HCL (PF) 1 % IJ SOLN: 5.0000 mL | INTRAMUSCULAR | Status: DC | PRN

## 2017-04-08 MED ORDER — SODIUM CHLORIDE 0.9 % IV SOLN: 100.0000 mL | INTRAVENOUS | Status: DC | PRN

## 2017-04-08 MED ORDER — HYDROCODONE-ACETAMINOPHEN 5-325 MG PO TABS
ORAL_TABLET | ORAL | Status: AC
  Administered 2017-04-08: 1 via ORAL
  Filled 2017-04-08: qty 1

## 2017-04-08 MED ORDER — HEPARIN SODIUM (PORCINE) 1000 UNIT/ML DIALYSIS
1000.0000 [IU] | INTRAMUSCULAR | Status: DC | PRN
  Administered 2017-04-08: 1000 [IU] via INTRAVENOUS_CENTRAL

## 2017-04-08 MED ORDER — ALTEPLASE 2 MG IJ SOLR: 2.0000 mg | Freq: Once | INTRAMUSCULAR | Status: DC | PRN

## 2017-04-08 MED ORDER — HEPARIN SODIUM (PORCINE) 1000 UNIT/ML DIALYSIS
100.0000 [IU]/kg | INTRAMUSCULAR | Status: DC | PRN
  Administered 2017-04-08: 6000 [IU] via INTRAVENOUS_CENTRAL
  Administered 2017-04-08: 5800 [IU] via INTRAVENOUS_CENTRAL
  Filled 2017-04-08 (×3): qty 12

## 2017-04-08 MED ORDER — PENTAFLUOROPROP-TETRAFLUOROETH EX AERO: 1.0000 "application " | INHALATION_SPRAY | CUTANEOUS | Status: DC | PRN

## 2017-04-08 NOTE — Progress Notes (Signed)
PROGRESS NOTE    Teresa Dalton  ZOX:[id number] DOB: Feb 29, 1966 DOA: 03/23/2017 PCP: Patient, No Pcp Per   Brief Narrative: 51 year old female with history of hypertension, type 2 diabetes, chronic kidney disease stage IV in 2015 who hasn't seen nephrology since presented with bilateral foot pain associated with lower extremity edema.  Assessment & Plan:  # New onset ESRD: -patient has been receiving hemodialysis via Tunneled catheter, tolerating well.  -s/p AV fistula by vascular surgeon on 10/25. -Monitor BMP and electrolytes. -Outpatient hemodialysis arranged.  #Hypertension: Monitor blood pressure. Continue amlodipine, labetalol.fluctuation in blood pressure.  #Peripheral neuropathy pain/b/l lower leg pain: Patient reported with b/l leg pain. She said she has pain going on for more than a month. There is small superficial black colored wound ? 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  -doppler nagative for DVT -Continue neurontin and robaxin. The dose of neurontin reduced to adjust ESRD -X-ray with subcutaneous edema. ABI vascular study incomplete. -PT/OT recommended CIR. CIR evaluation ongoing for safe discharge.likely discharge to see IR tomorrow. -continue supportive care  #Anemia of chronic kidney disease:hemoglobin noted to be 7.2 before dialysis, repeat lab in the morning. Continue IV iron and ESA during dialysis.   #Type 2 diabetes: Continue current insulin regimen. Monitor blood sugar level.blood sugar level acceptable.  #Obesity: Dietary adjustment and healthylifestyle.  #Hyperlipidemia: Continue statin   #mild temperature; Patient is afebrile now. Chest x-ray unremarkable. Monitor temperature, Tylenol as needed.  DVT prophylaxis:heparin subcutaneous Code Status:full code Family Communication:no family at bedside Disposition Plan:currently admitted    Consultants:   Nephrologist  Vascular surgeon  Procedures:tunneled catheter placement AVF creation on  10/25 Antimicrobials:none  Subjective: Bilateral foot pain is stable. No new event. No SOB, chest pain, N/V Objective: Vitals:   04/08/17 0530 04/08/17 0545 04/08/17 0552 04/08/17 1000  BP: 111/62 (!) 151/92 (!) 143/77 (!) 175/77  Pulse: 81 79 75 82  Resp: (!) 36 (!) 21 (!) 21 18  Temp:   98.7 F (37.1 C) 98.2 F (36.8 C)  TempSrc:   Oral Oral  SpO2: 100% 94% 100% 100%  Weight:   107.3 kg (236 lb 8.9 oz)   Height:        Intake/Output Summary (Last 24 hours) at 04/08/17 1157 Last data filed at 04/08/17 0900  Gross per 24 hour  Intake              360 ml  Output             4000 ml  Net            -3640 ml   Filed Weights   04/06/17 2116 04/08/17 0120 04/08/17 0552  Weight: 117.1 kg (258 lb 2.5 oz) 111.3 kg (245 lb 6 oz) 107.3 kg (236 lb 8.9 oz)    Examination:  General exam:NAD. Respiratory system: CTAB, no wheeze Cardiovascular system: RRR s1s2 nl Gastrointestinal system: Abdomen is soft, NT. BS+ Central nervous system: Alert awake anf non-focal Extremities: b/l LE pitting edema, pedal pulse palpable, superficial wound on LLE.unchanged Skin: No rashes, lesions or ulcers Psychiatry: Judgement and insight appear normal. Mood & affect appropriate.     Data Reviewed: I have personally reviewed following labs and imaging studies  CBC:  Recent Labs Lab 04/02/17 2154 04/03/17 1536 04/04/17 0206 04/09/2017 0353 04/08/17 0115  WBC 12.6* 14.5* 15.1* 15.8* 16.3*  HGB 7.1* 7.6* 7.8* 7.6* 7.2*  HCT 21.9* 23.7* 24.6* 25.1* 23.6*  MCV 89.8 91.2 91.1 93.0 94.4  PLT 287 310 315 324 264  Basic Metabolic Panel:  Recent Labs Lab 04/02/17 2100 04/03/17 1536 04/04/2017 0353 04/08/17 0120  NA 137 138 142 136  K 4.7 4.5 4.4 4.9  CL 105 103 103 97*  CO2 19* 22 24 24   GLUCOSE 118* 150* 96 95  BUN 77* 49* 44* 48*  CREATININE 12.20* 9.22* 9.17* 10.51*  CALCIUM 8.1* 8.5* 9.3 9.0  PHOS 7.6* 5.5*  --  7.8*   GFR: Estimated Creatinine Clearance: 7.9 mL/min (A) (by C-G  formula based on SCr of 10.51 mg/dL (H)). Liver Function Tests:  Recent Labs Lab 04/02/17 2100 04/03/17 1536 04/08/17 0120  ALBUMIN 2.4* 2.5* 2.5*   No results for input(s): LIPASE, AMYLASE in the last 168 hours. No results for input(s): AMMONIA in the last 168 hours. Coagulation Profile:  Recent Labs Lab 03/18/2017 0353  INR 1.21   Cardiac Enzymes: No results for input(s): CKTOTAL, CKMB, CKMBINDEX, TROPONINI in the last 168 hours. BNP (last 3 results) No results for input(s): PROBNP in the last 8760 hours. HbA1C: No results for input(s): HGBA1C in the last 72 hours. CBG:  Recent Labs Lab 04/07/17 0750 04/07/17 1224 04/07/17 1642 04/07/17 2127 04/08/17 0824  GLUCAP 110* 94 114* 130* 144*   Lipid Profile: No results for input(s): CHOL, HDL, LDLCALC, TRIG, CHOLHDL, LDLDIRECT in the last 72 hours. Thyroid Function Tests: No results for input(s): TSH, T4TOTAL, FREET4, T3FREE, THYROIDAB in the last 72 hours. Anemia Panel: No results for input(s): VITAMINB12, FOLATE, FERRITIN, TIBC, IRON, RETICCTPCT in the last 72 hours. Sepsis Labs: No results for input(s): PROCALCITON, LATICACIDVEN in the last 168 hours.  Recent Results (from the past 240 hour(s))  MRSA PCR Screening     Status: None   Collection Time: 04/01/17  2:45 PM  Result Value Ref Range Status   MRSA by PCR NEGATIVE NEGATIVE Final    Comment:        The GeneXpert MRSA Assay (FDA approved for NASAL specimens only), is one component of a comprehensive MRSA colonization surveillance program. It is not intended to diagnose MRSA infection nor to guide or monitor treatment for MRSA infections.   Culture, blood (Routine X 2) w Reflex to ID Panel     Status: None (Preliminary result)   Collection Time: 04/03/17  7:01 PM  Result Value Ref Range Status   Specimen Description BLOOD LEFT WRIST  Final   Special Requests   Final    BOTTLES DRAWN AEROBIC AND ANAEROBIC Blood Culture adequate volume   Culture NO  GROWTH 4 DAYS  Final   Report Status PENDING  Incomplete  Culture, blood (Routine X 2) w Reflex to ID Panel     Status: None (Preliminary result)   Collection Time: 04/03/17  7:01 PM  Result Value Ref Range Status   Specimen Description BLOOD RIGHT ANTECUBITAL  Final   Special Requests   Final    BOTTLES DRAWN AEROBIC AND ANAEROBIC Blood Culture results may not be optimal due to an excessive volume of blood received in culture bottles   Culture NO GROWTH 4 DAYS  Final   Report Status PENDING  Incomplete  Surgical pcr screen     Status: None   Collection Time: 04/10/2017  7:43 AM  Result Value Ref Range Status   MRSA, PCR NEGATIVE NEGATIVE Final   Staphylococcus aureus NEGATIVE NEGATIVE Final    Comment: (NOTE) The Xpert SA Assay (FDA approved for NASAL specimens in patients 39 years of age and older), is one component of a comprehensive surveillance program. It  is not intended to diagnose infection nor to guide or monitor treatment.          Radiology Studies: No results found.      Scheduled Meds: . amLODipine  10 mg Oral QHS  . calcitRIOL  0.5 mcg Oral QODAY  . darbepoetin (ARANESP) injection - DIALYSIS  200 mcg Intravenous Q Mon-HD  . gabapentin  300 mg Oral Daily  . heparin subcutaneous  5,000 Units Subcutaneous Q8H  . HYDROcodone-acetaminophen  1 tablet Oral Once  . insulin aspart  0-15 Units Subcutaneous TID WC  . insulin detemir  32 Units Subcutaneous BID  . labetalol  200 mg Oral BID  . lanthanum  1,000 mg Oral TID WC  . methocarbamol  500 mg Oral TID  . multivitamin  1 tablet Oral QHS  . simvastatin  20 mg Oral Daily   Continuous Infusions: . sodium chloride 10 mL/hr at 03/13/2017 0844  . sodium chloride    . sodium chloride    . ferric gluconate (FERRLECIT/NULECIT) IV Stopped (04/08/17 0525)     LOS: 9 days    Teresa Jaynie CollinsPrasad Bhandari, MD [facility] Pager [phone]  If 7PM-7AM, please contact night-coverage [email or web address] Password  [email or web address] 04/08/2017, 11:57 AM

## 2017-04-08 NOTE — Progress Notes (Signed)
HD tx completed @ 9483 w/o problems other than pt's pain level and system almost clotting, UF goal met, blood rinsed back, VSS, pt was not redirectable and hard to f/c, kept pulling off monitors and lifting arm tangling HD lines despite trying to redirect her throughout tx. Report called to Margie Ege, RN/Tina Issacs, RN for Perry Mount, RN

## 2017-04-08 NOTE — Progress Notes (Signed)
S: 51 year old female with history of hypertension, type 2 diabetes, chronic kidney disease stage IV in 2015 who hasn't seen nephrology since presented with bilateral foot pain associated with lower extremity edema.now recently started on dialysis.  Patient was complaining of 10 /10 generalized body ache, pacing around in bed, had 1 Norco 45 minutes ago.  O:BP (!) 175/77 (BP Location: Right Arm)   Pulse 82   Temp 98.2 F (36.8 C) (Oral)   Resp 18   Ht 5\' 5"  (1.651 m)   Wt 236 lb 8.9 oz (107.3 kg)   LMP 03/26/2017   SpO2 100%   BMI 39.36 kg/m   Intake/Output Summary (Last 24 hours) at 04/08/17 1035 Last data filed at 04/08/17 0900  Gross per 24 hour  Intake              360 ml  Output             4000 ml  Net            -3640 ml   Intake/Output: I/O last 3 completed shifts: In: 460 [P.O.:460] Out: 4000 [Other:4000]  Intake/Output this shift:  Total I/O In: 120 [P.O.:120] Out: 0  Weight change: -12 lb 12.6 oz (-5.8 kg) Gen: Well-developed lady, very irritable and not cooperative with exam. Massive obesity CVS: Regular rate and rhythm. Gr2/6 M Resp: Clear bilaterally on anterior auscultation.Decreased bs Abd: Soft, nontender, obese, bowel sounds positive Obese, liver down 5 cm Ext: 1+ lower extremity edema, small superficial black-colored ulcer on left lower extremity, left aVF with bruit.   Recent Labs Lab 04/02/17 2100 04/03/17 1536 03/16/2017 0353 04/08/17 0120  NA 137 138 142 136  K 4.7 4.5 4.4 4.9  CL 105 103 103 97*  CO2 19* 22 24 24   GLUCOSE 118* 150* 96 95  BUN 77* 49* 44* 48*  CREATININE 12.20* 9.22* 9.17* 10.51*  ALBUMIN 2.4* 2.5*  --  2.5*  CALCIUM 8.1* 8.5* 9.3 9.0  PHOS 7.6* 5.5*  --  7.8*   Liver Function Tests:  Recent Labs Lab 04/02/17 2100 04/03/17 1536 04/08/17 0120  ALBUMIN 2.4* 2.5* 2.5*   No results for input(s): LIPASE, AMYLASE in the last 168 hours. No results for input(s): AMMONIA in the last 168 hours. CBC:  Recent Labs Lab  04/02/17 2154 04/03/17 1536 04/04/17 0206 03/27/2017 0353 04/08/17 0115  WBC 12.6* 14.5* 15.1* 15.8* 16.3*  HGB 7.1* 7.6* 7.8* 7.6* 7.2*  HCT 21.9* 23.7* 24.6* 25.1* 23.6*  MCV 89.8 91.2 91.1 93.0 94.4  PLT 287 310 315 324 264   Cardiac Enzymes: No results for input(s): CKTOTAL, CKMB, CKMBINDEX, TROPONINI in the last 168 hours. CBG:  Recent Labs Lab 04/07/17 0750 04/07/17 1224 04/07/17 1642 04/07/17 2127 04/08/17 0824  GLUCAP 110* 94 114* 130* 144*    Iron Studies: No results for input(s): IRON, TIBC, TRANSFERRIN, FERRITIN in the last 72 hours. Studies/Results: No results found. Marland Kitchen amLODipine  10 mg Oral QHS  . calcitRIOL  0.5 mcg Oral QODAY  . darbepoetin (ARANESP) injection - DIALYSIS  200 mcg Intravenous Q Mon-HD  . gabapentin  300 mg Oral Daily  . heparin subcutaneous  5,000 Units Subcutaneous Q8H  . HYDROcodone-acetaminophen  1 tablet Oral Once  . insulin aspart  0-15 Units Subcutaneous TID WC  . insulin detemir  32 Units Subcutaneous BID  . labetalol  200 mg Oral BID  . lanthanum  1,000 mg Oral TID WC  . methocarbamol  500 mg Oral TID  . multivitamin  1 tablet Oral QHS  . simvastatin  20 mg Oral Daily    BMET    Component Value Date/Time   NA 136 04/08/2017 0120   K 4.9 04/08/2017 0120   CL 97 (L) 04/08/2017 0120   CO2 24 04/08/2017 0120   GLUCOSE 95 04/08/2017 0120   BUN 48 (H) 04/08/2017 0120   CREATININE 10.51 (H) 04/08/2017 0120   CREATININE 1.40 (H) 07/21/2011 1429   CALCIUM 9.0 04/08/2017 0120   GFRNONAA 4 (L) 04/08/2017 0120   GFRAA 4 (L) 04/08/2017 0120   CBC    Component Value Date/Time   WBC 16.3 (H) 04/08/2017 0115   RBC 2.50 (L) 04/08/2017 0115   HGB 7.2 (L) 04/08/2017 0115   HCT 23.6 (L) 04/08/2017 0115   PLT 264 04/08/2017 0115   MCV 94.4 04/08/2017 0115   MCH 28.8 04/08/2017 0115   MCHC 30.5 04/08/2017 0115   RDW 14.5 04/08/2017 0115   LYMPHSABS 1.4 04/01/2017 0046   MONOABS 0.7 04/01/2017 0046   EOSABS 0.5 04/01/2017 0046    BASOSABS 0.0 04/01/2017 0046     Assessment/Plan:  ESRD - CLIP process is complete and she now has permanent access. She finished her dialysis early morning today. Pulling off about 4 L of fluid. - Continue scheduled dialysis.  Still vol xs  Generalized body ache. Her white cell are trending up, may be having some viral illness.  Patient remained afebrile. -Keep monitoring  -Check UA, blood culture and CBC with differential.  Leg pain - Will need to further follow up outpatient. Calciphylaxis is a possibility but will need a skin biopsy to definitively diagnose this.   HTN - blood pressure improves with HD and amlodipine, continue amlodipine at discharge   Anemia - cont ESA and IV iron with HD, repeat Tsat in 1 month   Mineral metabolic disorder - continue lanthanum at discharge and calcitriol   DM - continue insulin at discharge  Good control  Non adherance - will need continued encouragement   Obesity - will need continued encouragement   Hyperlipidemia - continue simvastatin  ANXIETY  LIMITING PROGRESS    I have seen and examined this patient and agree with the plan of care seen, eval, examined, counseled, discussed with primary, staff, and resident .  Affan Callow L 04/08/2017, 3:28 PM

## 2017-04-08 NOTE — Progress Notes (Signed)
HD tx initiated via HD cath w/o problem, pull/push/flush equally w/o problem, VSS, will cont to monitor while on HD tx 

## 2017-04-08 NOTE — Plan of Care (Signed)
Problem: Activity: Goal: Risk for activity intolerance will decrease Outcome: Not Progressing Pt acts like she is giving up. She does not want to move around or do for herself.

## 2017-04-08 NOTE — Progress Notes (Signed)
Late Entry: This nurse called HD at 2007 to see what time and if pt was going to HD tonight. This nurse was told that pt would be going that it would just be later. BP meds were not given because pt is supposed to go to dialysis.   Larey Dayshristy M Endy Easterly, RN

## 2017-04-09 LAB — CBC WITH DIFFERENTIAL/PLATELET
BASOS PCT: 0 %
Basophils Absolute: 0.1 10*3/uL (ref 0.0–0.1)
EOS ABS: 0.5 10*3/uL (ref 0.0–0.7)
EOS PCT: 3 %
HCT: 25.7 % — ABNORMAL LOW (ref 36.0–46.0)
Hemoglobin: 8 g/dL — ABNORMAL LOW (ref 12.0–15.0)
LYMPHS ABS: 1.6 10*3/uL (ref 0.7–4.0)
Lymphocytes Relative: 10 %
MCH: 29.4 pg (ref 26.0–34.0)
MCHC: 31.1 g/dL (ref 30.0–36.0)
MCV: 94.5 fL (ref 78.0–100.0)
MONOS PCT: 9 %
Monocytes Absolute: 1.6 10*3/uL — ABNORMAL HIGH (ref 0.1–1.0)
Neutro Abs: 13.1 10*3/uL — ABNORMAL HIGH (ref 1.7–7.7)
Neutrophils Relative %: 78 %
PLATELETS: 278 10*3/uL (ref 150–400)
RBC: 2.72 MIL/uL — ABNORMAL LOW (ref 3.87–5.11)
RDW: 14.6 % (ref 11.5–15.5)
WBC: 16.8 10*3/uL — ABNORMAL HIGH (ref 4.0–10.5)

## 2017-04-09 LAB — RENAL FUNCTION PANEL
ALBUMIN: 2.8 g/dL — AB (ref 3.5–5.0)
Anion gap: 15 (ref 5–15)
BUN: 34 mg/dL — AB (ref 6–20)
CALCIUM: 9.5 mg/dL (ref 8.9–10.3)
CO2: 25 mmol/L (ref 22–32)
CREATININE: 7.73 mg/dL — AB (ref 0.44–1.00)
Chloride: 95 mmol/L — ABNORMAL LOW (ref 101–111)
GFR calc Af Amer: 6 mL/min — ABNORMAL LOW (ref >60)
GFR calc non Af Amer: 5 mL/min — ABNORMAL LOW (ref >60)
GLUCOSE: 114 mg/dL — AB (ref 65–99)
Phosphorus: 7.6 mg/dL — ABNORMAL HIGH (ref 2.5–4.6)
Potassium: 4.6 mmol/L (ref 3.5–5.1)
SODIUM: 135 mmol/L (ref 135–145)

## 2017-04-09 LAB — C DIFFICILE QUICK SCREEN W PCR REFLEX
C DIFFICILE (CDIFF) TOXIN: NEGATIVE
C DIFFICLE (CDIFF) ANTIGEN: POSITIVE — AB

## 2017-04-09 LAB — CK: Total CK: 515 U/L — ABNORMAL HIGH (ref 38–234)

## 2017-04-09 LAB — CLOSTRIDIUM DIFFICILE BY PCR: CDIFFPCR: POSITIVE — AB

## 2017-04-09 LAB — GLUCOSE, CAPILLARY
GLUCOSE-CAPILLARY: 132 mg/dL — AB (ref 65–99)
Glucose-Capillary: 116 mg/dL — ABNORMAL HIGH (ref 65–99)
Glucose-Capillary: 118 mg/dL — ABNORMAL HIGH (ref 65–99)
Glucose-Capillary: 88 mg/dL (ref 65–99)

## 2017-04-09 MED ORDER — VANCOMYCIN 50 MG/ML ORAL SOLUTION
125.0000 mg | Freq: Four times a day (QID) | ORAL | Status: DC
  Administered 2017-04-10 – 2017-04-15 (×15): 125 mg via ORAL
  Filled 2017-04-09 (×24): qty 2.5

## 2017-04-09 NOTE — Progress Notes (Signed)
Physical Therapy Treatment Patient Details Name: Teresa Dalton MRN: [id number] DOB: 12-14-1965 Today's Date: 04/09/2017    History of Present Illness 51 year old female with history of hypertension, type 2 diabetes, chronic kidney disease stage IV in 2015 who hasn't seen nephrology since presented with bilateral foot pain associated with lower extremity edema.    PT Comments    Pt not progressing with therapy at this time, discussed mobility at length and the importance for her health as well as being able to go home, but pt could not be convinced to even sit EOB. Performed low level bed exercises to help her move and try to manage LE pain but pt very focused on pain and the desire to go home. Do not feel that she could participate adequately in CIR at this point, changing rec to SNF. PT will continue to follow.    Follow Up Recommendations  SNF     Equipment Recommendations  None recommended by PT    Recommendations for Other Services       Precautions / Restrictions Precautions Precautions: Fall Restrictions Weight Bearing Restrictions: No    Mobility  Bed Mobility               General bed mobility comments: attempted to work on rolling side to side in bed but each attempt resulted in pt c/o BLE pain that prevented her from being able to complete task  Transfers                 General transfer comment: pt refused, she was getting up to [facility] this morning with nsg and could not maintain standing and had to be lowered to floor  Ambulation/Gait             General Gait Details: unable   Stairs            Wheelchair Mobility    Modified Rankin (Stroke Patients Only)       Balance                                            Cognition Arousal/Alertness: Awake/alert Behavior During Therapy: WFL for tasks assessed/performed Overall Cognitive Status: Within Functional Limits for tasks assessed                                  General Comments: pt very down, tearful, self limiting      Exercises General Exercises - Lower Extremity Ankle Circles/Pumps: AROM;Both;10 reps;Supine Quad Sets: AROM;Both;10 reps;Supine Gluteal Sets: AROM;Both;10 reps;Supine Heel Slides: AROM;Both;5 reps;Supine Hip ABduction/ADduction: AROM;Both;5 reps;Supine (with pillow between knees (but still painful for pt))    General Comments General comments (skin integrity, edema, etc.): discussed the importance of mobility for getting home, for pain management, and strengthening      Pertinent Vitals/Pain Pain Assessment: Faces Faces Pain Scale: Hurts worst Pain Location: BLEs Pain Descriptors / Indicators: Shooting;Sharp Pain Intervention(s): Limited activity within patient's tolerance;Monitored during session    Home Living                      Prior Function            PT Goals (current goals can now be found in the care plan section) Acute Rehab PT Goals Patient Stated Goal: return home, move with less  pain  PT Goal Formulation: With patient Time For Goal Achievement: 04/20/17 Potential to Achieve Goals: Fair Progress towards PT goals: Not progressing toward goals - comment (LE pain and emotional state)    Frequency    Min 2X/week      PT Plan Discharge plan needs to be updated;Frequency needs to be updated    Co-evaluation              AM-PAC PT "6 Clicks" Daily Activity  Outcome Measure  Difficulty turning over in bed (including adjusting bedclothes, sheets and blankets)?: A Lot Difficulty moving from lying on back to sitting on the side of the bed? : A Lot Difficulty sitting down on and standing up from a chair with arms (e.g., wheelchair, bedside commode, etc,.)?: Unable Help needed moving to and from a bed to chair (including a wheelchair)?: A Lot Help needed walking in hospital room?: Total Help needed climbing 3-5 steps with a railing? : Total 6 Click Score: 9     End of Session   Activity Tolerance: Patient limited by pain Patient left: in bed;with bed alarm set;with call bell/phone within reach Nurse Communication: Mobility status PT Visit Diagnosis: Muscle weakness (generalized) (M62.81);Other abnormalities of gait and mobility (R26.89) Pain - part of body: Leg     Time: [id number] PT Time Calculation (min) (ACUTE ONLY): 22 min  Charges:  $Therapeutic Exercise: 8-22 mins                    G Codes:       Teresa Dalton, PT  Acute Rehab Services  [phone]    Teresa Dalton 04/09/2017, 12:56 PM

## 2017-04-09 NOTE — Plan of Care (Signed)
Problem: Pain Managment: Goal: General experience of comfort will improve Outcome: Not Progressing Pt continues to express pain and weakness in both legs. Will continue to evaluate.

## 2017-04-09 NOTE — Progress Notes (Signed)
Pt does not want to try to do anything for herself. This am pt had a bowel movement on herself and did not even call to ask for the bedpan. She called afterward to have staff clean her up. She states she has no energy [facility] or to even slide back in bed. Pt has become a 2 person assist.   Larey Dayshristy M Noah Lembke, RN

## 2017-04-09 NOTE — Consult Note (Signed)
Hospital Consult    Reason for Consult:  BLE pain Requesting Physician:  Ronalee Belts  MRN #:  [id number]  History of Present Illness: This is a 51 y.o. female who was admitted on 03/14/2017 with increasing peripheral edema.  She had a TDC placed on 03/31/17 by IR.  [facility] was consulted for permanent HD access.  On 05-02-17, she was taken to the OR and underwent thrombectomy of the left arm cephalic vein and left brachiocephalic AV fistula creation by Dr. Randie Heinz.    The pt was c/o BLE pain.  She had a venous doppler that was negative for DVT.  ABI's were unable to be obtained  due to patient pain tolerance and ankle wounds. Detected waveforms appear monophasic bilaterally.  Waveforms consistent with bilateral severe peripheral arterial disease.  It was very difficult to obtain information from the pt.  She tells me that her legs hurt, but her feet do not hurt.    It is both legs and when asked where does the pain start, she says anywhere.  She states that before she came in the hospital, her legs did not hurt.  When asked if they hurt when walking, she said rarely and can walk a long ways before they hurt.  She says that hanging them off the bed makes it better.     The pt is on a statin for cholesterol management.   She is on insulin for diabetes.  She is on beta blocker and CCB for blood pressure management.   She does smoke cigarettes.    Past Medical History:  Diagnosis Date  . Anemia of chronic disease    /notes 04/02/2017  . CKD (chronic kidney disease), stage IV (HCC)    rin 2015/notes 04/02/2017; enal insufficiency (1.6-1.9 creatinine)  . ESRD (end stage renal disease) on dialysis [facility])    "new onset; just started dialysis 2 days ago" (04/03/2017)  . Hyperlipidemia   . Hypertension   . Thyroid disease    ? past problem  . Type II diabetes mellitus (HCC)     Past Surgical History:  Procedure Laterality Date  . AV FISTULA PLACEMENT Left 05/02/2017   Procedure: CEPHALIC VEIN THROMBECTOMY  LEFT ARM and  BRACHIOCEPHALIC  ARTERIOVENOUS (AV) FISTULA CREATION LEFT ARM;  Surgeon: Maeola Harman, MD;  [facility];  Service: Vascular;  Laterality: Left;  . CESAREAN SECTION  1988; 1986  . CYSTECTOMY  2001   thyroid; pt denies this hx on 04/03/2017  . IR FLUORO GUIDE CV LINE RIGHT  03/31/2017  . IR US GUIDE VASC ACCESS RIGHT  03/31/2017    No Known Allergies  Prior to Admission medications   Medication Sig Start Date End Date Taking? Authorizing Provider  acetaminophen (TYLENOL) 325 MG tablet Take 650 mg by mouth every 6 (six) hours as needed for mild pain.   Yes [provider]  amLODipine (NORVASC) 10 MG tablet Take 10 mg by mouth daily.   Yes [provider]  cloNIDine (CATAPRES) 0.3 MG tablet Take 1 tablet (0.3 mg total) by mouth 2 (two) times daily. 11/27/15  Yes Focht, Jessica L, PA  gabapentin (NEURONTIN) 300 MG capsule Take 1 capsule (300 mg total) by mouth 3 (three) times daily. 02/15/17  Yes Dorena Bodo, NP  glimepiride (AMARYL) 4 MG tablet Take 1 tablet (4 mg total) by mouth daily with breakfast. 11/27/15  Yes Focht, Jessica L, PA  hydrochlorothiazide (HYDRODIURIL) 25 MG tablet Take 25 mg by mouth daily.  07/21/11  Yes Peggyann Juba,  Melissa, NP  insulin detemir (LEVEMIR FLEXPEN) 100 UNIT/ML injection Inject 40 Units into the skin 2 (two) times daily. Patient taking differently: Inject 70 Units into the skin 2 (two) times daily.  08/14/11  Yes Sandford Craze'Sullivan, Melissa, NP  losartan (COZAAR) 50 MG tablet Take 1 tablet (50 mg total) by mouth daily. 11/27/15  Yes Focht, Jessica L, PA  metoprolol tartrate (LOPRESSOR) 50 MG tablet Take 50 mg by mouth 2 (two) times daily.   Yes [provider]  simvastatin (ZOCOR) 20 MG tablet Take 1 tablet (20 mg total) by mouth daily. 11/27/15  Yes Focht, Jessica L, PA  sodium bicarbonate 650 MG tablet Take 1,300 mg by mouth 2 (two) times daily.   Yes [provider]  Insulin Pen Needle 31G X 5 MM MISC Use  with insulin injection two times a day. 03/21/11   Sandford Craze'Sullivan, Melissa, NP  Multiple Vitamins-Minerals (MULTIPLE VITAMINS/WOMENS PO) Take 1 tablet by mouth daily.      [provider]    Social History   Social History  . Marital status: Single    Spouse name: N/A  . Number of children: 2  . Years of education: N/A   Occupational History  . [employer]   Social History Main Topics  . Smoking status: Current Every Day Smoker    Packs/day: 0.25    Years: 30.00    Types: Cigarettes  . Smokeless tobacco: Never Used  . Alcohol use No  . Drug use: Yes    Types: Marijuana     Comment: 04/03/2017 "not a routine thing"  . Sexual activity: Not Currently   Other Topics Concern  . Not on file   Social History Narrative   Regular exercise: no   Lives with one of her sons              Family History  Problem Relation Age of Onset  . Diabetes Mother   . Heart disease Mother   . Hyperlipidemia Mother   . Cancer Father        lung, non-smoker. deceased 4532yr  . Diabetes Sister   . Heart disease Brother        MI @ 44    ROS: [x]  Positive   [ ]  Negative   [ ]  All sytems reviewed and are negative  Cardiac: []  chest pain/pressure []  palpitations []  SOB lying flat []  DOE  Vascular: [x]  pain in legs but not feet []  pain in legs at rest []  pain in legs at night []  non-healing ulcers []  hx of DVT [x]  swelling in legs  Pulmonary: []  productive cough []  asthma/wheezing []  home O2  Neurologic: []  weakness in []  arms []  legs []  numbness in []  arms []  legs []  hx of CVA []  mini stroke [] difficulty speaking or slurred speech []  temporary loss of vision in one eye []  dizziness  Hematologic: []  hx of cancer []  bleeding problems []  problems with blood clotting easily  Endocrine:   [x]  diabetes [x]  thyroid disease  GI []  vomiting blood []  blood in stool  GU: [x]  CKD/renal failure [x]  HD--[]  M/W/F or []  T/T/S []  burning  with urination []  blood in urine  Psychiatric: []  anxiety []  depression  Musculoskeletal: []  arthritis []  joint pain  Integumentary: []  rashes [x]  ulcers  Constitutional: []  fever []  chills   Physical Examination  Vitals:   04/09/17 0735 04/09/17 1015  BP: (!) 159/59 (!) 105/46  Pulse: 75 73  Resp: 18   Temp:  98.2 F (36.8 C)   SpO2: 99% 98%   Body mass index is 39.36 kg/m.  General:  WDWN in NAD Gait: Not observed HENT: WNL, normocephalic Pulmonary: normal non-labored breathing Abdomen: obese, soft Skin: without rashes Vascular Exam/Pulses:  Right Left  Femoral Unable to palpate due to body habitus  Unable to palpate due to body habitus   Popliteal Unable to palpate  Unable to palpate   DP Unable to palpate  Unable to palpate   PT Unable to palpate Unable to palpate    Extremities:  Left leg medial aspect above the ankle.  Pain with touching of her legs--thigh and below knee equally bilaterally  Unable to palpate thrill in fistula; incision is healing nicely left antecubital space Musculoskeletal: no muscle wasting or atrophy  Neurologic: A&O X 3;  No focal weakness or paresthesias are detected; speech is fluent/normal  CBC    Component Value Date/Time   WBC 16.8 (H) 04/09/2017 0209   RBC 2.72 (L) 04/09/2017 0209   HGB 8.0 (L) 04/09/2017 0209   HCT 25.7 (L) 04/09/2017 0209   PLT 278 04/09/2017 0209   MCV 94.5 04/09/2017 0209   MCH 29.4 04/09/2017 0209   MCHC 31.1 04/09/2017 0209   RDW 14.6 04/09/2017 0209   LYMPHSABS 1.6 04/09/2017 0209   MONOABS 1.6 (H) 04/09/2017 0209   EOSABS 0.5 04/09/2017 0209   BASOSABS 0.1 04/09/2017 0209    BMET    Component Value Date/Time   NA 135 04/09/2017 0209   K 4.6 04/09/2017 0209   CL 95 (L) 04/09/2017 0209   CO2 25 04/09/2017 0209   GLUCOSE 114 (H) 04/09/2017 0209   BUN 34 (H) 04/09/2017 0209   CREATININE 7.73 (H) 04/09/2017 0209   CREATININE 1.40 (H) 07/21/2011 1429   CALCIUM 9.5 04/09/2017  0209   GFRNONAA 5 (L) 04/09/2017 0209   GFRAA 6 (L) 04/09/2017 0209    COAGS: Lab Results  Component Value Date   INR 1.21 03/13/2017     Non-Invasive Vascular Imaging:   ABI's 04/06/17: Unable to obtain bilateral ABI's due to patient pain tolerance and ankle wounds. Detected waveforms appear monophasic bilaterally. Waveforms consistent with bilateral severe peripheral arterial disease.  Venous duplex 04/03/2017:   No evidence of deep vein thrombosis in the visualized veins of the lower extremities or baker's cysts bilaterally.  Very difficult exam due to patient body habitus, positioning and severe swelling.  Statin:  Yes.   Beta Blocker:  Yes.   Aspirin:  No. ACEI:  No. ARB:  Yes.   CCB use:  Yes Other antiplatelets/anticoagulants:  Yes.   SQ heparin   ASSESSMENT/PLAN: This is a 51 y.o. female with bilateral leg pain    -exam and history difficult to obtain due to pt being uncomfortable.   -wound on LLE medial aspect above ankle (see picture above) -ABI's were not obtained due to pt intolerance.  Waveforms were monophasic bilaterally.  Pt may need aortogram with runoff to evaluate arterial anatomy -Dr. Arbie Cookey to see pt this afternoon.   Doreatha Massed, PA-C [facility] [phone]    I have examined the patient, reviewed and agree with above.  Very difficult problem.  The patient has classic calciphylaxis.  She denies any pain in her feet.  The pain is related to the calciphylaxis and not arterial insufficiency.  She does have moderate to severe arterial insufficiency with monophasic waveforms in her feet bilaterally.  This will make it difficult to her to heal the wounds  in her posterior calf.  She also has lesions in her thighs bilaterally with calciphylaxis but no skin breakdown.  Needs aggressive medical treatment of her calciphylaxis.  Will need eventual arteriography to determine if the options for improved arterial flow are possible particularly  to the left leg to heal the wound.  The main cause of the wound is calciphylaxis and not arterial insufficiency.  We will follow with you  Gretta Began, MD 04/09/2017 6:07 PM

## 2017-04-09 NOTE — Progress Notes (Signed)
Occupational Therapy Treatment Patient Details Name: Teresa KayserDiahann Dalton V. MRN: [id number] DOB: 06/06/1966 Today's Date: 04/09/2017    History of present illness 51 year old female with history of hypertension, type 2 diabetes, chronic kidney disease stage IV in 2015 who hasn't seen nephrology since presented with bilateral foot pain associated with lower extremity edema.   OT comments  Pt complaining of BLE pain but willing to participate with OT EOB. Attempted x 5 to boost in preparation for standing. Unable to lift buttocks off bed. Discussed possible use of stedy with pt with padding on LE plate if pt tolerates. Will continue to follow. Recommend rehab at SNF.   Follow Up Recommendations  SNF;Supervision/Assistance - 24 hour    Equipment Recommendations  3 in 1 bedside commode;Other (comment)    Recommendations for Other Services      Precautions / Restrictions Precautions Precautions: Fall       Mobility Bed Mobility               General bed mobility comments: Sitting EOB on entry  Transfers                 General transfer comment: Attempted sit -s tand. Unable to boost from bed with max A    Balance     Sitting balance-Leahy Scale: Fair                                     ADL either performed or assessed with clinical judgement   ADL Overall ADL's : Needs assistance/impaired     Grooming: Set up;Sitting                               Functional mobility during ADLs: Maximal assistance (unable to achieve upright standing) General ADL Comments: Discussed use of stedy with NT using pillow at base. Completed grooming task while sitting EOB     Vision       Perception     Praxis      Cognition Arousal/Alertness: Lethargic;Suspect due to medications Behavior During Therapy: [facility] for tasks assessed/performed Overall Cognitive Status: Within Functional Limits for tasks assessed                                           Exercises Exercises: Other exercises Other Exercises Other Exercises: general BUE AROM ex. 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 Enjoys "fixing herself up".    Pertinent Vitals/ Pain       Pain Assessment: Faces Faces Pain Scale: Hurts little more Pain Location: BLEs Pain Descriptors / Indicators: Shooting;Sharp Pain Intervention(s): Limited activity within patient's tolerance  Home Living                                          Prior Functioning/Environment              Frequency  Min 2X/week        Progress Toward Goals  OT Goals(current goals can now be found in the care plan section)  Progress towards OT goals: Progressing  toward goals  Acute Rehab OT Goals Patient Stated Goal: return home, move with less pain  OT Goal Formulation: With patient Time For Goal Achievement: 04/20/17 Potential to Achieve Goals: Good ADL Goals Pt Will Perform Grooming: with min assist;standing Pt Will Perform Upper Body Bathing: with min guard assist;sitting Pt Will Perform Lower Body Bathing: sit to/from stand;with min guard assist Pt Will Perform Lower Body Dressing: with min assist;sit to/from stand Pt Will Transfer to Toilet: with min assist;ambulating;bedside commode Pt Will Perform Toileting - Clothing Manipulation and hygiene: with min guard assist;sit to/from stand  Plan Discharge plan needs to be updated    Co-evaluation                 AM-PAC PT "6 Clicks" Daily Activity     Outcome Measure   Help from another person eating meals?: None Help from another person taking care of personal grooming?: None Help from another person toileting, which includes using toliet, bedpan, or urinal?: A Lot Help from another person bathing (including washing, rinsing, drying)?: A Lot Help from another person to put on and taking off regular upper  body clothing?: A Little Help from another person to put on and taking off regular lower body clothing?: A Lot 6 Click Score: 17    End of Session    OT Visit Diagnosis: Unsteadiness on feet (R26.81);Other abnormalities of gait and mobility (R26.89);Pain;Muscle weakness (generalized) (M62.81) Pain - part of body: Knee;Leg (B)   Activity Tolerance Patient tolerated treatment well   Patient Left in bed;with call bell/phone within reach;with bed alarm set   Nurse Communication Mobility status        Time: 1610-9604 OT Time Calculation (min): 18 min  Charges: OT General Charges $OT Visit: 1 Visit OT Treatments $Self Care/Home Management : 8-22 mins  [facility], OT/L  [phone] 04/09/2017   Teresa Dalton,Teresa Dalton 04/09/2017, 6:19 PM

## 2017-04-09 NOTE — Progress Notes (Addendum)
PROGRESS NOTE    Kellen Hover  ZOX:[id number] DOB: [birth year] DOA: 10-Apr-2017 PCP: Patient, No Pcp Per   Brief Narrative: 51 year old female with history of hypertension, type 2 diabetes, chronic kidney disease stage IV in 2015 who hasn't seen nephrology since presented with bilateral foot pain associated with lower extremity edema.  Assessment & Plan:  # New onset ESRD: -patient has been receiving hemodialysis via Tunneled catheter, tolerating well.  -s/p AV fistula by vascular surgeon on 10/25. -Monitor BMP and electrolytes. -Outpatient hemodialysis arranged.  #Hypertension: Monitor blood pressure. Continue amlodipine, labetalol.fluctuation in blood pressure.  #Peripheral neuropathy pain/b/l lower leg pain: As per pt, the pain going on for more than a month. There is small superficial black colored wound  On RLE? 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 Pt has pain on both legs below thigh. -doppler nagative for DVT -X-ray with subcutaneous edema. ABI vascular study incomplete. -pain is still not controlled. Pt reported feeling better while hanging her legs on the bed ? Peripheral artery disease.  I Consulted vascular surgeon. May need CT angiogram, I will defer this to VSS -order CK -PT/OT now recommended SNF. SW consulted.   #Anemia of chronic kidney disease:hemoglobin 8 today. Continue IV iron and ESA during dialysis.   #Type 2 diabetes: Continue current insulin regimen. Monitor blood sugar level.blood sugar level acceptable.  #Obesity: Dietary adjustment and healthylifestyle.  #Hyperlipidemia: Continue statin   # diarrhea; had a fall episode of loose bowel movement and has worsening leukocytosis. Abdomen exam is benign. I'll order c diff. Continue to monitor. Repeat CBC in the morning.  DVT prophylaxis:heparin subcutaneous Code Status:full code Family Communication:no family at bedside Disposition Plan:currently admitted    Consultants:   Nephrologist  Vascular  surgeon  Procedures:tunneled catheter placement AVF creation on 10/25 Antimicrobials:none  Subjective: No improvement in lower extremities pain. Patient reported she feels better when hanging her legs. No nausea vomiting however has diarrhea. Objective: Vitals:   04/09/17 0416 04/09/17 0500 04/09/17 0735 04/09/17 1015  BP: 117/89  (!) 159/59 (!) 105/46  Pulse: 72  75 73  Resp: 18  18   Temp: 98.9 F (37.2 C)  98.2 F (36.8 C)   TempSrc: Oral  Oral   SpO2: 99%  99% 98%  Weight:  107.3 kg (236 lb 8.9 oz)    Height:        Intake/Output Summary (Last 24 hours) at 04/09/17 1313 Last data filed at 04/09/17 1100  Gross per 24 hour  Intake              480 ml  Output              201 ml  Net              279 ml   Filed Weights   04/08/17 0120 04/08/17 0552 04/09/17 0500  Weight: 111.3 kg (245 lb 6 oz) 107.3 kg (236 lb 8.9 oz) 107.3 kg (236 lb 8.9 oz)    Examination:  General exam: Sitting on bed with hanging her Legs at the edge of bed Respiratory system: Clear bilateral, no wheezing Cardiovascular system: Regular rate rhythm, S1 and S2 normal Gastrointestinal system: Abdomen soft, nontender, not distended. Bowel sound positive Central nervous system: Alert awake anf non-focal Extremities: b/l LE pitting edema, pedal pulse palpable, superficial wound on LLE.unchanged Skin: No rashes, lesions or ulcers Psychiatry: Judgement and insight appear normal. Mood & affect appropriate.     Data Reviewed: I have personally reviewed following labs and imaging studies  CBC:  Recent Labs Lab 04/03/17 1536 04/04/17 0206 05/01/2017 0353 04/08/17 0115 04/09/17 0209  WBC 14.5* 15.1* 15.8* 16.3* 16.8*  NEUTROABS  --   --   --   --  13.1*  HGB 7.6* 7.8* 7.6* 7.2* 8.0*  HCT 23.7* 24.6* 25.1* 23.6* 25.7*  MCV 91.2 91.1 93.0 94.4 94.5  PLT 310 315 324 264 278   Basic Metabolic Panel:  Recent Labs Lab 04/02/17 2100 04/03/17 1536 01-May-2017 0353 04/08/17 0120 04/09/17 0209  NA  137 138 142 136 135  K 4.7 4.5 4.4 4.9 4.6  CL 105 103 103 97* 95*  CO2 19* 22 24 24 25   GLUCOSE 118* 150* 96 95 114*  BUN 77* 49* 44* 48* 34*  CREATININE 12.20* 9.22* 9.17* 10.51* 7.73*  CALCIUM 8.1* 8.5* 9.3 9.0 9.5  PHOS 7.6* 5.5*  --  7.8* 7.6*   GFR: Estimated Creatinine Clearance: 10.7 mL/min (A) (by C-G formula based on SCr of 7.73 mg/dL (H)). Liver Function Tests:  Recent Labs Lab 04/02/17 2100 04/03/17 1536 04/08/17 0120 04/09/17 0209  ALBUMIN 2.4* 2.5* 2.5* 2.8*   No results for input(s): LIPASE, AMYLASE in the last 168 hours. No results for input(s): AMMONIA in the last 168 hours. Coagulation Profile:  Recent Labs Lab 05-01-2017 0353  INR 1.21   Cardiac Enzymes: No results for input(s): CKTOTAL, CKMB, CKMBINDEX, TROPONINI in the last 168 hours. BNP (last 3 results) No results for input(s): PROBNP in the last 8760 hours. HbA1C: No results for input(s): HGBA1C in the last 72 hours. CBG:  Recent Labs Lab 04/07/17 2127 04/08/17 0824 04/08/17 1716 04/08/17 2020 04/09/17 1205  GLUCAP 130* 144* 92 125* 118*   Lipid Profile: No results for input(s): CHOL, HDL, LDLCALC, TRIG, CHOLHDL, LDLDIRECT in the last 72 hours. Thyroid Function Tests: No results for input(s): TSH, T4TOTAL, FREET4, T3FREE, THYROIDAB in the last 72 hours. Anemia Panel: No results for input(s): VITAMINB12, FOLATE, FERRITIN, TIBC, IRON, RETICCTPCT in the last 72 hours. Sepsis Labs: No results for input(s): PROCALCITON, LATICACIDVEN in the last 168 hours.  Recent Results (from the past 240 hour(s))  MRSA PCR Screening     Status: None   Collection Time: 04/01/17  2:45 PM  Result Value Ref Range Status   MRSA by PCR NEGATIVE NEGATIVE Final    Comment:        The GeneXpert MRSA Assay (FDA approved for NASAL specimens only), is one component of a comprehensive MRSA colonization surveillance program. It is not intended to diagnose MRSA infection nor to guide or monitor treatment  for MRSA infections.   Culture, blood (Routine X 2) w Reflex to ID Panel     Status: None   Collection Time: 04/03/17  7:01 PM  Result Value Ref Range Status   Specimen Description BLOOD LEFT WRIST  Final   Special Requests   Final    BOTTLES DRAWN AEROBIC AND ANAEROBIC Blood Culture adequate volume   Culture NO GROWTH 5 DAYS  Final   Report Status 04/08/2017 FINAL  Final  Culture, blood (Routine X 2) w Reflex to ID Panel     Status: None   Collection Time: 04/03/17  7:01 PM  Result Value Ref Range Status   Specimen Description BLOOD RIGHT ANTECUBITAL  Final   Special Requests   Final    BOTTLES DRAWN AEROBIC AND ANAEROBIC Blood Culture results may not be optimal due to an excessive volume of blood received in culture bottles   Culture NO GROWTH 5 DAYS  Final   Report Status 04/08/2017 FINAL  Final  Surgical pcr screen     Status: None   Collection Time: 08/19/16  7:43 AM  Result Value Ref Range Status   MRSA, PCR NEGATIVE NEGATIVE Final   Staphylococcus aureus NEGATIVE NEGATIVE Final    Comment: (NOTE) The Xpert SA Assay (FDA approved for NASAL specimens in patients 222 years of age and older), is one component of a comprehensive surveillance program. It is not intended to diagnose infection nor to guide or monitor treatment.          Radiology Studies: No results found.      Scheduled Meds: . amLODipine  10 mg Oral QHS  . calcitRIOL  0.5 mcg Oral QODAY  . darbepoetin (ARANESP) injection - DIALYSIS  200 mcg Intravenous Q Mon-HD  . gabapentin  300 mg Oral Daily  . heparin subcutaneous  5,000 Units Subcutaneous Q8H  . insulin aspart  0-15 Units Subcutaneous TID WC  . insulin detemir  32 Units Subcutaneous BID  . labetalol  200 mg Oral BID  . lanthanum  1,000 mg Oral TID WC  . methocarbamol  500 mg Oral TID  . multivitamin  1 tablet Oral QHS  . simvastatin  20 mg Oral Daily   Continuous Infusions: . sodium chloride 10 mL/hr at 08/19/16 0844  . sodium chloride     . sodium chloride    . ferric gluconate (FERRLECIT/NULECIT) IV Stopped (04/08/17 0525)     LOS: 10 days    Angely Dietz Jaynie CollinsPrasad Bevely Hackbart, MD [facility] Pager [phone]  If 7PM-7AM, please contact night-coverage [email or web address] Password [email or web address] 04/09/2017, 1:13 PM

## 2017-04-09 NOTE — Progress Notes (Signed)
[facility] notified that patient for C-diff.Ilean SkillVeronica Aunesti Pellegrino LPN

## 2017-04-09 NOTE — Progress Notes (Signed)
[facility] Progress Note    Assessment/ Plan:   1. ESRD - Next HD tomorrow 10/30.  CLIP.    2. Leg pain - Will need to further follow up outpatient. Calciphylaxis is a possibility but will need a skin biopsy to definitively diagnose this.   3. HTN - blood pressure improves with HD and amlodipine, continue amlodipine at discharge   4. Anemia - cont ESA and IV iron with HD, repeat Tsat in 1 month   5. Mineral metabolic disorder - continue lanthanum at discharge and calcitriol   6. DM - continue insulin at discharge  Good control  7. Non adherance - will need continued encouragement   8. Obesity - will need continued encouragement   9. Hyperlipidemia - continue simvastatin    Subjective:    Lots of loose stools- C diff being sent.     Objective:   BP (!) 105/46 (BP Location: Right Arm)   Pulse 73   Temp 98.2 F (36.8 C) (Oral)   Resp 18   Ht 5\' 5"  (1.651 m)   Wt 107.3 kg (236 lb 8.9 oz)   LMP 03/26/2017   SpO2 98%   BMI 39.36 kg/m   Intake/Output Summary (Last 24 hours) at 04/09/17 1357 Last data filed at 04/09/17 1100  Gross per 24 hour  Intake              480 ml  Output              201 ml  Net              279 ml   Weight change: -4 kg (-8 lb 13.1 oz)  Physical Exam: ZOX:[id number], obese lady, in no acute distress. CVS: regular rate and rhythm. Resp: clear bilaterally.decreased bs Abd: soft, non tender, obese, bowel sounds positive.  Ext: 2+ lower extremity edema with a small superficial black color ulcer on LLE. Irreg edges around eschar.  ACCESS: New aVF LUA, PC R IJ  Imaging: No results found.  Labs: BMET  Recent Labs Lab 04/02/17 2100 04/03/17 1536 04/01/2017 0353 04/08/17 0120 04/09/17 0209  NA 137 138 142 136 135  K 4.7 4.5 4.4 4.9 4.6  CL 105 103 103 97* 95*  CO2 19* 22 24 24 25   GLUCOSE 118* 150* 96 95 114*  BUN 77* 49* 44* 48* 34*  CREATININE 12.20* 9.22* 9.17* 10.51* 7.73*  CALCIUM 8.1* 8.5* 9.3 9.0  9.5  PHOS 7.6* 5.5*  --  7.8* 7.6*   CBC  Recent Labs Lab 04/04/17 0206 03/23/2017 0353 04/08/17 0115 04/09/17 0209  WBC 15.1* 15.8* 16.3* 16.8*  NEUTROABS  --   --   --  13.1*  HGB 7.8* 7.6* 7.2* 8.0*  HCT 24.6* 25.1* 23.6* 25.7*  MCV 91.1 93.0 94.4 94.5  PLT 315 324 264 278    Medications:    . amLODipine  10 mg Oral QHS  . calcitRIOL  0.5 mcg Oral QODAY  . darbepoetin (ARANESP) injection - DIALYSIS  200 mcg Intravenous Q Mon-HD  . gabapentin  300 mg Oral Daily  . heparin subcutaneous  5,000 Units Subcutaneous Q8H  . insulin aspart  0-15 Units Subcutaneous TID WC  . insulin detemir  32 Units Subcutaneous BID  . labetalol  200 mg Oral BID  . lanthanum  1,000 mg Oral TID WC  . methocarbamol  500 mg Oral TID  . multivitamin  1 tablet Oral QHS  . simvastatin  20 mg Oral Daily  Teresa ButtnerElizabeth Sheika Coutts MD [facility] pgr [phone] 04/09/2017, 1:57 PM

## 2017-04-09 NOTE — Progress Notes (Signed)
Nurse tech and student nurse in room with patient. Pt trying to get from bedside commode to bed. Nurse tech suggested using lift but pt insisted on going to bed with nurse tech and student. Pt assisted to stand but unable to ambulate to bed. Nurse tech and student assisted patient to floor. Reports same pain in legs that she had this am. Denies any new pain. Pt alert and oriented x4. Used lift to get pt back into bed. Pt repositioned in bed and given pain medication. MD made aware. Attempted to contact sons without answer. Will attempt to contact again. No no new orders received.

## 2017-04-09 NOTE — Progress Notes (Addendum)
I met with pt at bedside with Nursing students earlier today. I discussed briefly with pt that she is not at a level for more intense therapies at this time. I updated RN CM and SW that I am not pursuing an inpt rehab admission at this time. I will follow at a distance.SNF is recommended.  [phone]

## 2017-04-10 DIAGNOSIS — A0472 Enterocolitis due to Clostridium difficile, not specified as recurrent: Secondary | ICD-10-CM

## 2017-04-10 LAB — URINALYSIS, ROUTINE W REFLEX MICROSCOPIC
Bilirubin Urine: NEGATIVE
Glucose, UA: NEGATIVE mg/dL
KETONES UR: NEGATIVE mg/dL
Leukocytes, UA: NEGATIVE
NITRITE: NEGATIVE
PH: 7 (ref 5.0–8.0)
Protein, ur: 30 mg/dL — AB
RBC / HPF: NONE SEEN RBC/hpf (ref 0–5)
SPECIFIC GRAVITY, URINE: 1 — AB (ref 1.005–1.030)

## 2017-04-10 LAB — RENAL FUNCTION PANEL
ALBUMIN: 2.6 g/dL — AB (ref 3.5–5.0)
ANION GAP: 18 — AB (ref 5–15)
BUN: 56 mg/dL — AB (ref 6–20)
CO2: 22 mmol/L (ref 22–32)
Calcium: 9.1 mg/dL (ref 8.9–10.3)
Chloride: 96 mmol/L — ABNORMAL LOW (ref 101–111)
Creatinine, Ser: 10.01 mg/dL — ABNORMAL HIGH (ref 0.44–1.00)
GFR calc Af Amer: 5 mL/min — ABNORMAL LOW (ref >60)
GFR, EST NON AFRICAN AMERICAN: 4 mL/min — AB (ref >60)
Glucose, Bld: 88 mg/dL (ref 65–99)
PHOSPHORUS: 9.2 mg/dL — AB (ref 2.5–4.6)
POTASSIUM: 4.8 mmol/L (ref 3.5–5.1)
Sodium: 136 mmol/L (ref 135–145)

## 2017-04-10 LAB — GLUCOSE, CAPILLARY
Glucose-Capillary: 104 mg/dL — ABNORMAL HIGH (ref 65–99)
Glucose-Capillary: 98 mg/dL (ref 65–99)
Glucose-Capillary: 95 mg/dL (ref 65–99)

## 2017-04-10 LAB — CBC
HEMATOCRIT: 24.3 % — AB (ref 36.0–46.0)
Hemoglobin: 7.3 g/dL — ABNORMAL LOW (ref 12.0–15.0)
MCH: 28.2 pg (ref 26.0–34.0)
MCHC: 30 g/dL (ref 30.0–36.0)
MCV: 93.8 fL (ref 78.0–100.0)
Platelets: 278 10*3/uL (ref 150–400)
RBC: 2.59 MIL/uL — ABNORMAL LOW (ref 3.87–5.11)
RDW: 14.5 % (ref 11.5–15.5)
WBC: 14.2 10*3/uL — AB (ref 4.0–10.5)

## 2017-04-10 MED ORDER — MUSCLE RUB 10-15 % EX CREA
TOPICAL_CREAM | CUTANEOUS | Status: DC | PRN
  Administered 2017-04-10: 06:00:00 via TOPICAL
  Filled 2017-04-10: qty 85

## 2017-04-10 MED ORDER — SODIUM THIOSULFATE 25 % IV SOLN
25.0000 g | INTRAVENOUS | Status: DC
  Administered 2017-04-12 – 2017-04-14 (×2): 25 g via INTRAVENOUS
  Filled 2017-04-10 (×6): qty 100

## 2017-04-10 MED ORDER — SUCROFERRIC OXYHYDROXIDE 500 MG PO CHEW
1000.0000 mg | CHEWABLE_TABLET | Freq: Three times a day (TID) | ORAL | Status: DC
  Administered 2017-04-11 – 2017-04-15 (×4): 1000 mg via ORAL
  Filled 2017-04-10 (×24): qty 2

## 2017-04-10 MED ORDER — SODIUM THIOSULFATE 25 % IV SOLN
25.0000 g | INTRAVENOUS | Status: DC | PRN
  Filled 2017-04-10: qty 100

## 2017-04-10 MED ORDER — LIDOCAINE-EPINEPHRINE 1 %-1:100000 IJ SOLN
10.0000 mL | Freq: Once | INTRAMUSCULAR | Status: DC
  Filled 2017-04-10: qty 10

## 2017-04-10 MED ORDER — HYDROCODONE-ACETAMINOPHEN 5-325 MG PO TABS
ORAL_TABLET | ORAL | Status: AC
  Filled 2017-04-10: qty 2

## 2017-04-10 MED ORDER — HYDROCODONE-ACETAMINOPHEN 5-325 MG PO TABS
1.0000 | ORAL_TABLET | ORAL | Status: DC | PRN
  Administered 2017-04-10: 1 via ORAL
  Administered 2017-04-10 (×3): 2 via ORAL
  Administered 2017-04-11 (×2): 1 via ORAL
  Filled 2017-04-10: qty 2
  Filled 2017-04-10: qty 1
  Filled 2017-04-10: qty 2
  Filled 2017-04-10 (×2): qty 1

## 2017-04-10 MED ORDER — SODIUM THIOSULFATE 25 % IV SOLN
25.0000 g | Freq: Once | INTRAVENOUS | Status: AC
  Administered 2017-04-10: 25 g via INTRAVENOUS
  Filled 2017-04-10: qty 100

## 2017-04-10 NOTE — Progress Notes (Signed)
PROGRESS NOTE    Teresa Dalton  UJW:[id number] DOB: 09/06/1965 DOA: 04/04/17 PCP: Patient, No Pcp Per   Brief Narrative: 51 year old female with history of hypertension, type 2 diabetes, chronic kidney disease stage IV in 2015 who hasn't seen nephrology since presented with bilateral foot pain associated with lower extremity edema.  Assessment & Plan:  # New onset ESRD: -patient has been receiving hemodialysis via Tunneled catheter, tolerating well.  -s/p AV fistula by vascular surgeon on 10/25. -Monitor BMP and electrolytes. -Outpatient hemodialysis arranged.  #Hypertension: Monitor blood pressure. Continue amlodipine, labetalol.fluctuation in blood pressure.  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 As per pt, the pain going on for more than a month. There is small superficial black colored wound  On RLE. -doppler nagative for DVT -X-ray with subcutaneous edema. ABI vascular study incomplete. - evaluated by vascular surgery recommended treatment for calciphylaxis. Follow-up surgeons further plan. -Patient was started on sodium thiosulfate during dialysis, started on lanthanum. Monitor calcium phosphorus per renal team. I discussed with Dr.Upton from nephrology. General surgery was consulted for the biopsy.  #Anemia of chronic kidney disease: Fluctuation in hemoglobin likely related with volume. Continue IV iron and ESA during dialysis. Monitor CBC  #C. difficile diarrhea: Patient is still with loose bowel movement. Started oral vancomycin. Abdomen exam benign. Continue supportive care.  #Type 2 diabetes: Continue current insulin regimen. Monitor blood sugar level.blood sugar level acceptable.  #Obesity: Dietary adjustment and healthylifestyle.  #Hyperlipidemia: Continue statin    DVT prophylaxis:heparin subcutaneous Code Status:full code Family Communication:no family at bedside Disposition Plan:currently admitted    Consultants:    Nephrologist  Vascular surgeon  Procedures:tunneled catheter placement AVF creation on 10/25 Antimicrobials:none  Subjective: No improvement in no extremity pain. Having diarrhea. Mild nausea no vomiting. No chest pain or shortness of breath. Objective: Vitals:   04/10/17 1130 04/10/17 1200 04/10/17 1230 04/10/17 1255  BP: 101/71 108/61 92/76   Pulse: 65 81 67   Resp:      Temp:      TempSrc:      SpO2:      Weight:    108 kg (238 lb 1.6 oz)  Height:        Intake/Output Summary (Last 24 hours) at 04/10/17 1313 Last data filed at 04/10/17 1255  Gross per 24 hour  Intake              240 ml  Output             3319 ml  Net            -3079 ml   Filed Weights   04/09/17 2124 04/10/17 0808 04/10/17 1255  Weight: 107.4 kg (236 lb 12.4 oz) 112.6 kg (248 lb 3.8 oz) 108 kg (238 lb 1.6 oz)    Examination:  General exam: Lying on bed at dialysis unit, not in distress Respiratory system: Clear bilaterally, no wheezing Cardiovascular system: Regular rate rhythm, S1 and S2 normal. Gastrointestinal system: Abdomen soft, nontender, nondistended. Thousand positive. Central nervous system: Alert awake and following commands.  Extremities: b/l LE pitting edema, pedal pulse palpable, superficial wound on LLE. Unchanged. Psychiatry: Judgement and insight appear normal. Mood & affect appropriate.     Data Reviewed: I have personally reviewed following labs and imaging studies  CBC:  Recent Labs Lab 04/04/17 0206 04/04/2017 0353 04/08/17 0115 04/09/17 0209 04/10/17 0808  WBC 15.1* 15.8* 16.3* 16.8* 14.2*  NEUTROABS  --   --   --  13.1*  --  HGB 7.8* 7.6* 7.2* 8.0* 7.3*  HCT 24.6* 25.1* 23.6* 25.7* 24.3*  MCV 91.1 93.0 94.4 94.5 93.8  PLT 315 324 264 278 278   Basic Metabolic Panel:  Recent Labs Lab 04/03/17 1536 2017-05-03 0353 04/08/17 0120 04/09/17 0209 04/10/17 0808  NA 138 142 136 135 136  K 4.5 4.4 4.9 4.6 4.8  CL 103 103 97* 95* 96*  CO2 22 24 24 25 22    GLUCOSE 150* 96 95 114* 88  BUN 49* 44* 48* 34* 56*  CREATININE 9.22* 9.17* 10.51* 7.73* 10.01*  CALCIUM 8.5* 9.3 9.0 9.5 9.1  PHOS 5.5*  --  7.8* 7.6* 9.2*   GFR: Estimated Creatinine Clearance: 8.3 mL/min (A) (by C-G formula based on SCr of 10.01 mg/dL (H)). Liver Function Tests:  Recent Labs Lab 04/03/17 1536 04/08/17 0120 04/09/17 0209 04/10/17 0808  ALBUMIN 2.5* 2.5* 2.8* 2.6*   No results for input(s): LIPASE, AMYLASE in the last 168 hours. No results for input(s): AMMONIA in the last 168 hours. Coagulation Profile:  Recent Labs Lab May 03, 2017 0353  INR 1.21   Cardiac Enzymes:  Recent Labs Lab 04/09/17 1412  CKTOTAL 515*   BNP (last 3 results) No results for input(s): PROBNP in the last 8760 hours. HbA1C: No results for input(s): HGBA1C in the last 72 hours. CBG:  Recent Labs Lab 04/08/17 2020 04/09/17 0944 04/09/17 1205 04/09/17 1658 04/09/17 2122  GLUCAP 125* 116* 118* 88 132*   Lipid Profile: No results for input(s): CHOL, HDL, LDLCALC, TRIG, CHOLHDL, LDLDIRECT in the last 72 hours. Thyroid Function Tests: No results for input(s): TSH, T4TOTAL, FREET4, T3FREE, THYROIDAB in the last 72 hours. Anemia Panel: No results for input(s): VITAMINB12, FOLATE, FERRITIN, TIBC, IRON, RETICCTPCT in the last 72 hours. Sepsis Labs: No results for input(s): PROCALCITON, LATICACIDVEN in the last 168 hours.  Recent Results (from the past 240 hour(s))  MRSA PCR Screening     Status: None   Collection Time: 04/01/17  2:45 PM  Result Value Ref Range Status   MRSA by PCR NEGATIVE NEGATIVE Final    Comment:        The GeneXpert MRSA Assay (FDA approved for NASAL specimens only), is one component of a comprehensive MRSA colonization surveillance program. It is not intended to diagnose MRSA infection nor to guide or monitor treatment for MRSA infections.   Culture, blood (Routine X 2) w Reflex to ID Panel     Status: None   Collection Time: 04/03/17  7:01 PM   Result Value Ref Range Status   Specimen Description BLOOD LEFT WRIST  Final   Special Requests   Final    BOTTLES DRAWN AEROBIC AND ANAEROBIC Blood Culture adequate volume   Culture NO GROWTH 5 DAYS  Final   Report Status 04/08/2017 FINAL  Final  Culture, blood (Routine X 2) w Reflex to ID Panel     Status: None   Collection Time: 04/03/17  7:01 PM  Result Value Ref Range Status   Specimen Description BLOOD RIGHT ANTECUBITAL  Final   Special Requests   Final    BOTTLES DRAWN AEROBIC AND ANAEROBIC Blood Culture results may not be optimal due to an excessive volume of blood received in culture bottles   Culture NO GROWTH 5 DAYS  Final   Report Status 04/08/2017 FINAL  Final  Surgical pcr screen     Status: None   Collection Time: 05/03/2017  7:43 AM  Result Value Ref Range Status   MRSA, PCR NEGATIVE NEGATIVE  Final   Staphylococcus aureus NEGATIVE NEGATIVE Final    Comment: (NOTE) The Xpert SA Assay (FDA approved for NASAL specimens in patients 49 years of age and older), is one component of a comprehensive surveillance program. It is not intended to diagnose infection nor to guide or monitor treatment.   C difficile quick scan w PCR reflex     Status: Abnormal   Collection Time: 04/09/17 11:52 AM  Result Value Ref Range Status   C Diff antigen POSITIVE (A) NEGATIVE Final   C Diff toxin NEGATIVE NEGATIVE Final   C Diff interpretation Results are indeterminate. See PCR results.  Final  Clostridium Difficile by PCR     Status: Abnormal   Collection Time: 04/09/17 11:52 AM  Result Value Ref Range Status   Toxigenic C Difficile by pcr POSITIVE (A) NEGATIVE Final    Comment: Positive for toxigenic C. difficile with little to no toxin production. Only treat if clinical presentation suggests symptomatic illness.         Radiology Studies: No results found.      Scheduled Meds: . HYDROcodone-acetaminophen      . amLODipine  10 mg Oral QHS  . darbepoetin (ARANESP) injection  - DIALYSIS  200 mcg Intravenous Q Mon-HD  . gabapentin  300 mg Oral Daily  . heparin subcutaneous  5,000 Units Subcutaneous Q8H  . insulin aspart  0-15 Units Subcutaneous TID WC  . insulin detemir  32 Units Subcutaneous BID  . labetalol  200 mg Oral BID  . lanthanum  1,000 mg Oral TID WC  . methocarbamol  500 mg Oral TID  . multivitamin  1 tablet Oral QHS  . simvastatin  20 mg Oral Daily  . sucroferric oxyhydroxide  1,000 mg Oral TID WC  . vancomycin  125 mg Oral QID   Continuous Infusions: . sodium chloride 10 mL/hr at 04/07/2017 0844  . sodium chloride    . sodium chloride    . ferric gluconate (FERRLECIT/NULECIT) IV 125 mg (04/10/17 1246)  . [START ON 04/12/2017] sodium thiosulfate infusion for calciphylaxis       LOS: 11 days    Lilliona Blakeney Jaynie CollinsPrasad Kiah Keay, MD [facility] Pager [phone]  If 7PM-7AM, please contact night-coverage [email or web address] Password [email or web address] 04/10/2017, 1:13 PM

## 2017-04-10 NOTE — Procedures (Signed)
Patient seen and examined on Hemodialysis. QB 400 TDC UF goal 3L Treatment adjusted as needed.  Teresa ButtnerElizabeth Mikaeel Petrow MD [facility]  pgr [phone] 1:19 PM

## 2017-04-10 NOTE — Progress Notes (Signed)
[facility] notified that pain is not managed with present orders. New orders received. Ilean SkillVeronica Jayan Raymundo LPN

## 2017-04-10 NOTE — Consult Note (Signed)
[name] Surgery Consult Note  Teresa Dalton V. September 17, 1965  [id number].    Requesting MD: Carolin Sicks, MD Chief Complaint/Reason for Consult: leg wounds, punch biopsy  HPI:  [name] is a 51 year-old female with a history of poorly controlled HTN, DM2, HLD, obesity, tobacco use, and CKD IV who presented to [facility] 10/19 with worsening peripheral edema and pain. She was lase seen by Dr. Jimmy Footman in 04/2014 and was told of having stage IV CKD but never followed up. Labs performed in ED revealed creatinine of 13, bicarb 13, potassium 6.1 and she was admitted for management of acute on chronic kidney failure with progression to ESRD. She is now on HD and left brachiocephalic AV fistula access created this hospital admission. She has persistent lower extremity pain with superficial wounds, present for roughly 4 weeks based on chart review, that have been evaluated by vascular surgery and nephrology. Workup negative for DVT but ABI's were inconclusive 2/2 patients pain. 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 The patient was started on sodium thiosulfate during HD and lanthanum empirically. Her hospitalization has also be complicated by c.dif.   Today during my evaluation the patient is oriented to person and [place], but not time (2016), however she answers simple questions appropriately. She is able to tell me she has bilateral leg pain and that the pain has been there for "a while". Per nurse, she is been oriented a majority of the time and her son visits her in the hospital. I have obtained the patients verbal consent for a biopsy however I will discuss the procedure with her son as well.   ROS: Review of Systems  Musculoskeletal:       Leg pain  All other systems reviewed and are negative.   Family History  Problem Relation Age of Onset  . Diabetes Mother   . Heart disease Mother   .  Hyperlipidemia Mother   . Cancer Father        lung, non-smoker. deceased 77yr . Diabetes Sister   . Heart disease Brother        MI @ 427   Past Medical History:  Diagnosis Date  . Anemia of chronic disease    /notes 04/02/2017  . CKD (chronic kidney disease), stage IV ([place]    rin 2015/notes 04/02/2017; enal insufficiency (1.6-1.9 creatinine)  . ESRD (end stage renal disease) on dialysis ([facility]    "new onset; just started dialysis 2 days ago" (04/03/2017)  . Hyperlipidemia   . Hypertension   . Thyroid disease    ? past problem  . Type II diabetes mellitus ([facility]     Past Surgical History:  Procedure Laterality Date  . AV FISTULA PLACEMENT Left 04/10/2017   Procedure: CEPHALIC VEIN THROMBECTOMY LEFT ARM and  BRACHIOCEPHALIC  ARTERIOVENOUS (AV) FISTULA CREATION LEFT ARM;  Surgeon: CWaynetta Sandy MD;  [place]  Service: Vascular;  Laterality: Left;  . CESAREAN SECTION  1988; 1986  . CYSTECTOMY  2001   thyroid; pt denies this hx on 04/03/2017  . IR FLUORO GUIDE CV LINE RIGHT  03/31/2017  . IR [place] VASC ACCESS RIGHT  03/31/2017    Social History:  reports that she has been smoking Cigarettes.  She has a 7.50 pack-year smoking history. She has never used smokeless tobacco. She reports that she uses drugs, including Marijuana. She reports that she does not drink alcohol.  Allergies:  No Known Allergies  Medications Prior to Admission  Medication Sig Dispense Refill  . acetaminophen (TYLENOL) 325 MG tablet Take 650 mg by mouth every 6 (six) hours as needed for mild pain.    Marland Kitchen amLODipine (NORVASC) 10 MG tablet Take 10 mg by mouth daily.    . cloNIDine (CATAPRES) 0.3 MG tablet Take 1 tablet (0.3 mg total) by mouth 2 (two) times daily. 60 tablet 0  . gabapentin (NEURONTIN) 300 MG capsule Take 1 capsule (300 mg total) by mouth 3 (three) times daily. 90 capsule 0  . glimepiride (AMARYL) 4 MG tablet Take 1 tablet (4 mg total) by mouth daily with breakfast. 30  tablet 0  . hydrochlorothiazide (HYDRODIURIL) 25 MG tablet Take 25 mg by mouth daily.     . insulin detemir (LEVEMIR FLEXPEN) 100 UNIT/ML injection Inject 40 Units into the skin 2 (two) times daily. (Patient taking differently: Inject 70 Units into the skin 2 (two) times daily. ) 24 mL 0  . losartan (COZAAR) 50 MG tablet Take 1 tablet (50 mg total) by mouth daily. 30 tablet 0  . metoprolol tartrate (LOPRESSOR) 50 MG tablet Take 50 mg by mouth 2 (two) times daily.    . simvastatin (ZOCOR) 20 MG tablet Take 1 tablet (20 mg total) by mouth daily. 30 tablet 0  . sodium bicarbonate 650 MG tablet Take 1,300 mg by mouth 2 (two) times daily.    . Insulin Pen Needle 31G X 5 MM MISC Use with insulin injection two times a day. 100 each 0  . Multiple Vitamins-Minerals (MULTIPLE VITAMINS/WOMENS PO) Take 1 tablet by mouth daily.        Blood pressure (P) 108/62, pulse (P) 81, temperature (P) 98.3 F (36.8 C), temperature source (P) Oral, resp. rate (!) 23, height 5' 5"  (1.651 m), weight 108 kg (238 lb 1.6 oz), last menstrual period 03/26/2017, SpO2 (P) 99 %. Physical Exam: Physical Exam  Constitutional: She appears well-developed. No distress.  HENT:  Head: Normocephalic and atraumatic.  Right Ear: External ear normal.  Left Ear: External ear normal.  Eyes: Conjunctivae and EOM are normal. Right eye exhibits no discharge. Left eye exhibits no discharge.  Neck: Normal range of motion. No tracheal deviation present.  Cardiovascular: Normal rate, regular rhythm and normal heart sounds.   No murmur heard. Pulmonary/Chest: Effort normal and breath sounds normal. No stridor. No respiratory distress. She has no wheezes. She has no rales.  Abdominal: Soft. Bowel sounds are normal. She exhibits no distension. There is no tenderness.  Musculoskeletal: Normal range of motion. She exhibits no edema or deformity.  Neurological: She is alert. No sensory deficit.  Oriented to person and place, not time  Skin: Skin  is warm and dry. She is not diaphoretic. No erythema.  BLE TTP.  Medial aspect of RLE with multiple small, firm, black plaques and one, large necrotic ulceration.  LLE with multiple, tender black plaque-like lesions.  Patient has one tender, palpable skin nodule in medial thigh.     Results for orders placed or performed during the hospital encounter of 03/17/2017 (from the past 48 hour(s))  Glucose, capillary     Status: None   Collection Time: 04/08/17  5:16 PM  Result Value Ref Range   Glucose-Capillary 92 65 - 99 mg/dL  Glucose, capillary     Status: Abnormal   Collection Time: 04/08/17  8:20 PM  Result Value Ref Range   Glucose-Capillary 125 (H) 65 - 99 mg/dL  CBC with Differential/Platelet  Status: Abnormal   Collection Time: 04/09/17  2:09 AM  Result Value Ref Range   WBC 16.8 (H) 4.0 - 10.5 K/uL   RBC 2.72 (L) 3.87 - 5.11 MIL/uL   Hemoglobin 8.0 (L) 12.0 - 15.0 g/dL   HCT 25.7 (L) 36.0 - 46.0 %   MCV 94.5 78.0 - 100.0 fL   MCH 29.4 26.0 - 34.0 pg   MCHC 31.1 30.0 - 36.0 g/dL   RDW 14.6 11.5 - 15.5 %   Platelets 278 150 - 400 K/uL   Neutrophils Relative % 78 %   Neutro Abs 13.1 (H) 1.7 - 7.7 K/uL   Lymphocytes Relative 10 %   Lymphs Abs 1.6 0.7 - 4.0 K/uL   Monocytes Relative 9 %   Monocytes Absolute 1.6 (H) 0.1 - 1.0 K/uL   Eosinophils Relative 3 %   Eosinophils Absolute 0.5 0.0 - 0.7 K/uL   Basophils Relative 0 %   Basophils Absolute 0.1 0.0 - 0.1 K/uL  Renal function panel     Status: Abnormal   Collection Time: 04/09/17  2:09 AM  Result Value Ref Range   Sodium 135 135 - 145 mmol/L   Potassium 4.6 3.5 - 5.1 mmol/L   Chloride 95 (L) 101 - 111 mmol/L   CO2 25 22 - 32 mmol/L   Glucose, Bld 114 (H) 65 - 99 mg/dL   BUN 34 (H) 6 - 20 mg/dL   Creatinine, Ser 7.73 (H) 0.44 - 1.00 mg/dL   Calcium 9.5 8.9 - 10.3 mg/dL   Phosphorus 7.6 (H) 2.5 - 4.6 mg/dL   Albumin 2.8 (L) 3.5 - 5.0 g/dL   GFR calc non Af Amer 5 (L) >60 mL/min   GFR calc Af Amer 6 (L) >60 mL/min     Comment: (NOTE) The eGFR has been calculated using the CKD EPI equation. This calculation has not been validated in all clinical situations. eGFR's persistently <60 mL/min signify possible Chronic Kidney Disease.    Anion gap 15 5 - 15  Glucose, capillary     Status: Abnormal   Collection Time: 04/09/17  9:44 AM  Result Value Ref Range   Glucose-Capillary 116 (H) 65 - 99 mg/dL  C difficile quick scan w PCR reflex     Status: Abnormal   Collection Time: 04/09/17 11:52 AM  Result Value Ref Range   C Diff antigen POSITIVE (A) NEGATIVE   C Diff toxin NEGATIVE NEGATIVE   C Diff interpretation Results are indeterminate. See PCR results.   Clostridium Difficile by PCR     Status: Abnormal   Collection Time: 04/09/17 11:52 AM  Result Value Ref Range   Toxigenic C Difficile by pcr POSITIVE (A) NEGATIVE    Comment: Positive for toxigenic C. difficile with little to no toxin production. Only treat if clinical presentation suggests symptomatic illness.  Glucose, capillary     Status: Abnormal   Collection Time: 04/09/17 12:05 PM  Result Value Ref Range   Glucose-Capillary 118 (H) 65 - 99 mg/dL   Comment 1 Notify RN    Comment 2 Document in Chart   CK     Status: Abnormal   Collection Time: 04/09/17  2:12 PM  Result Value Ref Range   Total CK 515 (H) 38 - 234 U/L  Glucose, capillary     Status: None   Collection Time: 04/09/17  4:58 PM  Result Value Ref Range   Glucose-Capillary 88 65 - 99 mg/dL  Glucose, capillary     Status: Abnormal  Collection Time: 04/09/17  9:22 PM  Result Value Ref Range   Glucose-Capillary 132 (H) 65 - 99 mg/dL  Urinalysis, Routine w reflex microscopic     Status: Abnormal   Collection Time: 04/10/17  5:40 AM  Result Value Ref Range   Color, Urine YELLOW YELLOW   APPearance HAZY (A) CLEAR   Specific Gravity, Urine 1.000 (L) 1.005 - 1.030   pH 7.0 5.0 - 8.0   Glucose, UA NEGATIVE NEGATIVE mg/dL   Hgb urine dipstick MODERATE (A) NEGATIVE   Bilirubin  Urine NEGATIVE NEGATIVE   Ketones, ur NEGATIVE NEGATIVE mg/dL   Protein, ur 30 (A) NEGATIVE mg/dL   Nitrite NEGATIVE NEGATIVE   Leukocytes, UA NEGATIVE NEGATIVE   RBC / HPF NONE SEEN 0 - 5 RBC/hpf   WBC, UA 0-5 0 - 5 WBC/hpf   Bacteria, UA RARE (A) NONE SEEN   Squamous Epithelial / LPF 0-5 (A) NONE SEEN  Renal function panel     Status: Abnormal   Collection Time: 04/10/17  8:08 AM  Result Value Ref Range   Sodium 136 135 - 145 mmol/L   Potassium 4.8 3.5 - 5.1 mmol/L   Chloride 96 (L) 101 - 111 mmol/L   CO2 22 22 - 32 mmol/L   Glucose, Bld 88 65 - 99 mg/dL   BUN 56 (H) 6 - 20 mg/dL   Creatinine, Ser 10.01 (H) 0.44 - 1.00 mg/dL   Calcium 9.1 8.9 - 10.3 mg/dL   Phosphorus 9.2 (H) 2.5 - 4.6 mg/dL   Albumin 2.6 (L) 3.5 - 5.0 g/dL   GFR calc non Af Amer 4 (L) >60 mL/min   GFR calc Af Amer 5 (L) >60 mL/min    Comment: (NOTE) The eGFR has been calculated using the CKD EPI equation. This calculation has not been validated in all clinical situations. eGFR's persistently <60 mL/min signify possible Chronic Kidney Disease.    Anion gap 18 (H) 5 - 15  CBC     Status: Abnormal   Collection Time: 04/10/17  8:08 AM  Result Value Ref Range   WBC 14.2 (H) 4.0 - 10.5 K/uL   RBC 2.59 (L) 3.87 - 5.11 MIL/uL   Hemoglobin 7.3 (L) 12.0 - 15.0 g/dL   HCT 24.3 (L) 36.0 - 46.0 %   MCV 93.8 78.0 - 100.0 fL   MCH 28.2 26.0 - 34.0 pg   MCHC 30.0 30.0 - 36.0 g/dL   RDW 14.5 11.5 - 15.5 %   Platelets 278 150 - 400 K/uL   No results found.  Assessment/Plan ESRD on HD DM2 HTN Anemia - hgb 7.3 today, appears stable. 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  - BX tray ordered, will plan to perform punch biopsy of the edge of 1-2 lower extremity skin lesions later today vs early tomorrow. - placed order to transcribe consent     Jill Alexanders, [facility] Surgery 04/10/2017, 1:30 PM Pager: [phone] Consults:  [phone] Mon-Fri 7:00 am-4:30 pm Sat-Sun 7:00 am-11:30 am

## 2017-04-10 NOTE — Plan of Care (Signed)
Problem: Pain Managment: Goal: General experience of comfort will improve Outcome: Not Progressing Pt continues to express pain in legs even after additional pain medication given. Will continue to re-evaluate pain.

## 2017-04-10 NOTE — Progress Notes (Signed)
  [facility] Progress Note    Assessment/ Plan:   1. ESRD - TTS schedule.  CLIP in process  2. 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 HTN - continue amlodipine  4. Anemia - cont ESA and IV iron with HD  5. Mineral metabolic disorder - continue lanthanum, added Velphoro today  6. DM - continue insulin at discharge  Good control  7. Non adherance - will need continued encouragement   8. Obesity - will need continued encouragement   9. Hyperlipidemia - continue simvastatin    Subjective:    Laying on her stomach.  Instructed to roll over.   Objective:   BP 92/76   Pulse 67   Temp 98.9 F (37.2 C) (Oral)   Resp (!) 23   Ht 5\' 5"  (1.651 m)   Wt 108 kg (238 lb 1.6 oz)   LMP 03/26/2017   SpO2 96%   BMI 39.62 kg/m   Intake/Output Summary (Last 24 hours) at 04/10/17 1317 Last data filed at 04/10/17 1255  Gross per 24 hour  Intake              240 ml  Output             3319 ml  Net            -3079 ml   Weight change: 0.1 kg (3.5 oz)  Physical Exam: WUJ:[id number]:well-developed, obese lady, in no acute distress. CVS: regular rate and rhythm. Resp: clear bilaterally.decreased bs Abd: soft, non tender, obese, bowel sounds positive.  Ext: 2+ lower extremity edema with multiple violaceous, indurated, painful nodules on bilateral shins. ACCESS: New aVF LUA, PC R IJ  Imaging: No results found.  Labs: BMET  Recent Labs Lab 04/03/17 1536 May 08, 2017 0353 04/08/17 0120 04/09/17 0209 04/10/17 0808  NA 138 142 136 135 136  K 4.5 4.4 4.9 4.6 4.8  CL 103 103 97* 95* 96*  CO2 22 24 24 25 22   GLUCOSE 150* 96 95 114* 88  BUN 49* 44* 48* 34* 56*  CREATININE 9.22* 9.17* 10.51* 7.73* 10.01*  CALCIUM 8.5* 9.3 9.0 9.5 9.1  PHOS 5.5*  --  7.8* 7.6* 9.2*   CBC  Recent Labs Lab May 08, 2017 0353 04/08/17 0115 04/09/17 0209 04/10/17 0808  WBC 15.8* 16.3* 16.8* 14.2*  NEUTROABS  --   --  13.1*   --   HGB 7.6* 7.2* 8.0* 7.3*  HCT 25.1* 23.6* 25.7* 24.3*  MCV 93.0 94.4 94.5 93.8  PLT 324 264 278 278    Medications:    . HYDROcodone-acetaminophen      . amLODipine  10 mg Oral QHS  . darbepoetin (ARANESP) injection - DIALYSIS  200 mcg Intravenous Q Mon-HD  . gabapentin  300 mg Oral Daily  . heparin subcutaneous  5,000 Units Subcutaneous Q8H  . insulin aspart  0-15 Units Subcutaneous TID WC  . insulin detemir  32 Units Subcutaneous BID  . labetalol  200 mg Oral BID  . lanthanum  1,000 mg Oral TID WC  . methocarbamol  500 mg Oral TID  . multivitamin  1 tablet Oral QHS  . simvastatin  20 mg Oral Daily  . sucroferric oxyhydroxide  1,000 mg Oral TID WC  . vancomycin  125 mg Oral QID      Bufford ButtnerElizabeth Weslee Fogg MD [facility] pgr [phone] 04/10/2017, 1:17 PM

## 2017-04-10 NOTE — Progress Notes (Signed)
Patient ID: Teresa Kayseriahann Mairena V., female   DOB: May 24, 1966, 51 y.o.   MRN: [id number] Very difficult management problem.  I do not know her baseline mental status.  Slightly more arousable and oriented this morning compared to yesterday.  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  Will be difficult to heal these calciphylaxis lesions even with normal blood flow.  Recommend arteriography for completeness sake.  Tried to explain this to the patient this morning but poorly oriented.  We will follow with you and obtain arteriogram later in the week.  Obviously not a candidate for open surgical repair.  May have endovascular options.

## 2017-04-11 ENCOUNTER — Inpatient Hospital Stay (HOSPITAL_COMMUNITY): Payer: BLUE CROSS/BLUE SHIELD

## 2017-04-11 LAB — CBC WITH DIFFERENTIAL/PLATELET
Basophils Absolute: 0.1 10*3/uL (ref 0.0–0.1)
Basophils Relative: 0 %
EOS ABS: 0.2 10*3/uL (ref 0.0–0.7)
Eosinophils Relative: 1 %
HEMATOCRIT: 26.9 % — AB (ref 36.0–46.0)
HEMOGLOBIN: 8.3 g/dL — AB (ref 12.0–15.0)
LYMPHS ABS: 1.6 10*3/uL (ref 0.7–4.0)
Lymphocytes Relative: 10 %
MCH: 28.8 pg (ref 26.0–34.0)
MCHC: 30.9 g/dL (ref 30.0–36.0)
MCV: 93.4 fL (ref 78.0–100.0)
MONO ABS: 2.4 10*3/uL — AB (ref 0.1–1.0)
MONOS PCT: 14 %
NEUTROS PCT: 75 %
Neutro Abs: 12.1 10*3/uL — ABNORMAL HIGH (ref 1.7–7.7)
Platelets: 318 10*3/uL (ref 150–400)
RBC: 2.88 MIL/uL — ABNORMAL LOW (ref 3.87–5.11)
RDW: 14.7 % (ref 11.5–15.5)
WBC: 16.4 10*3/uL — ABNORMAL HIGH (ref 4.0–10.5)

## 2017-04-11 LAB — GLUCOSE, CAPILLARY
GLUCOSE-CAPILLARY: 110 mg/dL — AB (ref 65–99)
Glucose-Capillary: 107 mg/dL — ABNORMAL HIGH (ref 65–99)
Glucose-Capillary: 125 mg/dL — ABNORMAL HIGH (ref 65–99)
Glucose-Capillary: 94 mg/dL (ref 65–99)

## 2017-04-11 LAB — BASIC METABOLIC PANEL
Anion gap: 19 — ABNORMAL HIGH (ref 5–15)
BUN: 41 mg/dL — ABNORMAL HIGH (ref 6–20)
CHLORIDE: 96 mmol/L — AB (ref 101–111)
CO2: 21 mmol/L — AB (ref 22–32)
CREATININE: 7.54 mg/dL — AB (ref 0.44–1.00)
Calcium: 9.9 mg/dL (ref 8.9–10.3)
GFR calc non Af Amer: 6 mL/min — ABNORMAL LOW (ref >60)
GFR, EST AFRICAN AMERICAN: 7 mL/min — AB (ref >60)
GLUCOSE: 100 mg/dL — AB (ref 65–99)
Potassium: 5 mmol/L (ref 3.5–5.1)
Sodium: 136 mmol/L (ref 135–145)

## 2017-04-11 MED ORDER — BACITRACIN ZINC 500 UNIT/GM EX OINT
TOPICAL_OINTMENT | Freq: Once | CUTANEOUS | Status: DC
  Filled 2017-04-11: qty 28.35

## 2017-04-11 MED ORDER — METHOCARBAMOL 500 MG PO TABS
500.0000 mg | ORAL_TABLET | Freq: Three times a day (TID) | ORAL | Status: DC | PRN
  Administered 2017-04-12 – 2017-04-15 (×4): 500 mg via ORAL
  Filled 2017-04-11 (×5): qty 1

## 2017-04-11 MED ORDER — DARBEPOETIN ALFA 200 MCG/0.4ML IJ SOSY
200.0000 ug | PREFILLED_SYRINGE | INTRAMUSCULAR | Status: DC
  Administered 2017-04-12: 200 ug via INTRAVENOUS

## 2017-04-11 MED ORDER — LIDOCAINE HCL (PF) 2 % IJ SOLN
0.0000 mL | Freq: Once | INTRAMUSCULAR | Status: DC | PRN
  Filled 2017-04-11: qty 20

## 2017-04-11 MED ORDER — HYDROCODONE-ACETAMINOPHEN 5-325 MG PO TABS
1.0000 | ORAL_TABLET | Freq: Four times a day (QID) | ORAL | Status: DC | PRN
  Administered 2017-04-11 – 2017-04-16 (×7): 1 via ORAL
  Filled 2017-04-11 (×6): qty 1

## 2017-04-11 NOTE — Progress Notes (Signed)
Patient refuses to sit in chair with legs reclined. Patient educated about how to use/when to use the call light.

## 2017-04-11 NOTE — Progress Notes (Signed)
Patient ID: Teresa Kayseriahann Hershman V., female   DOB: 1966/01/15, 51 y.o.   MRN: [id number]  Bilateral leg wounds of unknown etiology - punch biopsy performed and sent for pathology. Procedure note below. Leave dressing in place x24 hours, then change daily and as needed until wound closes. Ok to English as a second language teachershower tomorrow.   Skin Biopsy Procedure Note  Procedure: Punch biopsy  Pre-operative Diagnosis: leg wound  Post-procedure Diagnosis: normal  Indications: rash of unknown etiology  Anesthesia: lidocaine 1%  Procedure Details  The procedure, risks and complications have been discussed in detail (including, but not limited to pain, infection, bleeding) with the patient, and the patient wishesto proceed withthe procedure. The skin was sterilely prepped over the affected area in the usual fashion. Using a 4mm punch a skin biopsy was taken from wound on medal right lower leg. Specimen placed in formalin specimen cup. There was no bleeding. Dry dressing applied. The patient was observed until stable. There were no complications, and the patient tolerated the procedure well.  [name]

## 2017-04-11 NOTE — Progress Notes (Signed)
PROGRESS NOTE  Teresa Dalton V. JXB:[id number] DOB: 12-Oct-1965 DOA: 04/10/2017 PCP: Patient, No Pcp Per  HPI/Recap of past 2424 hours: 51 year old female with history of hypertension, diabetes, hyperlipidemia, CKD, presents with bilateral foot pain associated with lower extremity edema.  Patient is currently being managed for end-stage renal disease on dialysis.  Patient also noted to be C. difficile positive  Today, patient noted to be drowsy/lethargic, but oriented x3.  Patient continues to complain of bilateral leg pain, worse on the left.  Patient denied any chest pain, abdominal pain, worsening shortness of breath, cough, nausea/vomiting.  Diarrhea noted to have resolved.  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 difficile diarrhea  #New onset end-stage renal disease Currently on hemodialysis (T/Th/Sat) via tunneled catheter Status post AV fistula by vascular on 10/25 Nephrology on board Daily BMP Outpatient hemodialysis arranged  #Bilateral leg pain Superficial wounds noted, likely calciphylaxis Bilateral Doppler negative for DVT X-ray with subcutaneous edema.  ABI vascular study incomplete General surgery consulted for biopsy, done today Vascular surgery recommends treatment for calciphylaxis Patient was started on sodium thiosulfate during dialysis Monitor closely  #Lethargy Of unknown etiology, has been ongoing for the past several days On multiple pain meds, will make them as needed ?? OSA  Will order ABG, CT head  #Anemia of chronic kidney disease Stable Continue IV iron and ESA during dialysis Daily CBC  #C. difficile diarrhea Denies any loose bowel movements for  24 hours Afebrile, leukocytosis Continue oral vancomycin  #Type 2 diabetes Continue current insulin regimen  #Hyperlipidemia Continue statins   Code Status: Full  Family Communication: None at bedside  Disposition Plan: Home once stable   Consultants:  Nephrologist  Vascular surgeon  Procedures: AV fistula on 10/25  Antimicrobials:  P.o. vancomycin  DVT prophylaxis: Heparin   Objective: Vitals:   04/10/17 1721 04/10/17 2214 04/11/17 0418 04/11/17 0740  BP: (!) 127/55 (!) 109/47 118/68 (!) 128/58  Pulse: 88 75 82 80  Resp: 19 20 (!) 21 (!) 22  Temp: 100 F (37.8 C) 98.8 F (37.1 C) 98.2 F (36.8 C) 98.6 F (37 C)  TempSrc: Oral Oral Oral Oral  SpO2: 95% 99% 99% 92%  Weight:  108 kg (238 lb 1.6 oz)    Height:        Intake/Output Summary (Last 24 hours) at 04/11/17 1552 Last data filed at 04/11/17 [id number]  Gross per 24 hour  Intake              290 ml  Output                0 ml  Net              290 ml   Filed Weights   04/10/17 0808 04/10/17 1255 04/10/17 2214  Weight: 112.6 kg (248 lb 3.8 oz) 108 kg (238 lb 1.6 oz) 108 kg (238 lb 1.6 oz)    Exam:   General: Lethargic, oriented x3, able to follow commands  Cardiovascular: Heart sounds S1-S2 present, no added heart sounds  Respiratory: Chest clear bilaterally  Abdomen: Soft nontender nondistended, positive bowel sounds  Musculoskeletal: Trace bilateral pedal edema, superficial  wound on the left lower extremity  Skin: Normal  Psychiatry: Lethargic, unable to assess mood   Data Reviewed: CBC:  Recent Labs Lab 03/28/2017 0353 04/08/17 0115 04/09/17 0209 04/10/17 0808 04/11/17 0833  WBC 15.8* 16.3* 16.8* 14.2* 16.4*  NEUTROABS  --   --  13.1*  --  12.1*  HGB 7.6* 7.2* 8.0* 7.3* 8.3*  HCT 25.1* 23.6* 25.7* 24.3* 26.9*  MCV 93.0 94.4 94.5 93.8 93.4  PLT 324 264 278 278 318   Basic Metabolic Panel:  Recent Labs Lab 03/18/2017 0353 04/08/17 0120 04/09/17 0209 04/10/17 0808  04/11/17 0833  NA 142 136 135 136 136  K 4.4 4.9 4.6 4.8 5.0  CL 103 97* 95* 96* 96*  CO2 24 24 25 22  21*  GLUCOSE 96 95 114* 88 100*  BUN 44* 48* 34* 56* 41*  CREATININE 9.17* 10.51* 7.73* 10.01* 7.54*  CALCIUM 9.3 9.0 9.5 9.1 9.9  PHOS  --  7.8* 7.6* 9.2*  --    GFR: Estimated Creatinine Clearance: 11 mL/min (A) (by C-G formula based on SCr of 7.54 mg/dL (H)). Liver Function Tests:  Recent Labs Lab 04/08/17 0120 04/09/17 0209 04/10/17 0808  ALBUMIN 2.5* 2.8* 2.6*   No results for input(s): LIPASE, AMYLASE in the last 168 hours. No results for input(s): AMMONIA in the last 168 hours. Coagulation Profile:  Recent Labs Lab 03/29/2017 0353  INR 1.21   Cardiac Enzymes:  Recent Labs Lab 04/09/17 1412  CKTOTAL 515*   BNP (last 3 results) No results for input(s): PROBNP in the last 8760 hours. HbA1C: No results for input(s): HGBA1C in the last 72 hours. CBG:  Recent Labs Lab 04/10/17 1419 04/10/17 1645 04/10/17 2210 04/11/17 0734 04/11/17 1145  GLUCAP 104* 98 95 107* 110*   Lipid Profile: No results for input(s): CHOL, HDL, LDLCALC, TRIG, CHOLHDL, LDLDIRECT in the last 72 hours. Thyroid Function Tests: No results for input(s): TSH, T4TOTAL, FREET4, T3FREE, THYROIDAB in the last 72 hours. Anemia Panel: No results for input(s): VITAMINB12, FOLATE, FERRITIN, TIBC, IRON, RETICCTPCT in the last 72 hours. Urine analysis:    Component Value Date/Time   COLORURINE YELLOW 04/10/2017 0540   APPEARANCEUR HAZY (A) 04/10/2017 0540   LABSPEC 1.000 (L) 04/10/2017 0540   PHURINE 7.0 04/10/2017 0540   GLUCOSEU NEGATIVE 04/10/2017 0540   HGBUR MODERATE (A) 04/10/2017 0540   BILIRUBINUR NEGATIVE 04/10/2017 0540   KETONESUR NEGATIVE 04/10/2017 0540   PROTEINUR 30 (A) 04/10/2017 0540   UROBILINOGEN 0.2 05/30/2010 1628   NITRITE NEGATIVE 04/10/2017 0540   LEUKOCYTESUR NEGATIVE 04/10/2017 0540   Sepsis Labs: @LABRCNTIP (procalcitonin:4,lacticidven:4)  ) Recent Results  (from the past 240 hour(s))  Culture, blood (Routine X 2) w Reflex to ID Panel     Status: None   Collection Time: 04/03/17  7:01 PM  Result Value Ref Range Status   Specimen Description BLOOD LEFT WRIST  Final   Special Requests   Final    BOTTLES DRAWN AEROBIC AND ANAEROBIC Blood Culture adequate volume   Culture NO GROWTH 5 DAYS  Final   Report Status 04/08/2017 FINAL  Final  Culture, blood (Routine X 2) w Reflex to ID Panel     Status: None   Collection Time: 04/03/17  7:01 PM  Result Value Ref Range Status   Specimen Description BLOOD RIGHT ANTECUBITAL  Final   Special Requests   Final    BOTTLES DRAWN AEROBIC AND ANAEROBIC Blood Culture results may not be optimal due to an excessive volume of blood  received in culture bottles   Culture NO GROWTH 5 DAYS  Final   Report Status 04/08/2017 FINAL  Final  Surgical pcr screen     Status: None   Collection Time: 04/02/2017  7:43 AM  Result Value Ref Range Status   MRSA, PCR NEGATIVE NEGATIVE Final   Staphylococcus aureus NEGATIVE NEGATIVE Final    Comment: (NOTE) The Xpert SA Assay (FDA approved for NASAL specimens in patients 59 years of age and older), is one component of a comprehensive surveillance program. It is not intended to diagnose infection nor to guide or monitor treatment.   C difficile quick scan w PCR reflex     Status: Abnormal   Collection Time: 04/09/17 11:52 AM  Result Value Ref Range Status   C Diff antigen POSITIVE (A) NEGATIVE Final   C Diff toxin NEGATIVE NEGATIVE Final   C Diff interpretation Results are indeterminate. See PCR results.  Final  Clostridium Difficile by PCR     Status: Abnormal   Collection Time: 04/09/17 11:52 AM  Result Value Ref Range Status   Toxigenic C Difficile by pcr POSITIVE (A) NEGATIVE Final    Comment: Positive for toxigenic C. difficile with little to no toxin production. Only treat if clinical presentation suggests symptomatic illness.      Studies: No results  found.  Scheduled Meds: . amLODipine  10 mg Oral QHS  . bacitracin   Topical Once  . darbepoetin (ARANESP) injection - DIALYSIS  200 mcg Intravenous Q Mon-HD  . gabapentin  300 mg Oral Daily  . heparin subcutaneous  5,000 Units Subcutaneous Q8H  . insulin aspart  0-15 Units Subcutaneous TID WC  . insulin detemir  32 Units Subcutaneous BID  . labetalol  200 mg Oral BID  . lanthanum  1,000 mg Oral TID WC  . lidocaine-EPINEPHrine  10 mL Intradermal Once  . methocarbamol  500 mg Oral TID  . multivitamin  1 tablet Oral QHS  . simvastatin  20 mg Oral Daily  . sucroferric oxyhydroxide  1,000 mg Oral TID WC  . vancomycin  125 mg Oral QID    Continuous Infusions: . sodium chloride 10 mL/hr at 04/07/2017 0844  . ferric gluconate (FERRLECIT/NULECIT) IV 125 mg (04/10/17 1246)  . [START ON 04/12/2017] sodium thiosulfate infusion for calciphylaxis       LOS: 12 days     Briant Cedar, MD [facility] Pager [phone]  If 7PM-7AM, please contact night-coverage [email or web address] Password [email or web address] 04/11/2017, 3:52 PM

## 2017-04-11 NOTE — Progress Notes (Signed)
Patient ID: Teresa Kayseriahann Murillo V., female   DOB: 1965-06-15, 51 y.o.   MRN: [id number] Slightly more oriented this morning.  Still difficult to arouse.  Follows commands and answers questions appropriately.  Explained the concern regarding extensive calciphylaxis in both lower extremities.  She does have full-thickness loss in her calf.  Noninvasive study shows monophasic flow in her tibial vessels bilaterally.  Explained that we would plan arteriography to determine if there is any solution to improve flow to her lower extremities for healing.  Do not feel her wounds are related to arterial insufficiency.  She specifically denies any pain in her feet.  Motor and sensory intact in her feet.  For hemodialysis tomorrow.  We will plan arteriography on Friday and possible intervention

## 2017-04-11 NOTE — Progress Notes (Signed)
Patient fell this morning, was being transferred from bed to chair. Assisted to floor by RN and NT. Patient sustained no injuries. Maximove lift was used to assist patient to chair. Charge nurse and MD notified.

## 2017-04-11 NOTE — Progress Notes (Signed)
Physical Therapy Treatment Patient Details Name: Teresa Dalton MRN: [id number] DOB: December 06, 1965 Today's Date: 04/11/2017    History of Present Illness 51 year old female with history of hypertension, type 2 diabetes, chronic kidney disease stage IV in 2015 Presented with bilateral foot pain associated with lower extremity edema. New onset ESRD, Calicihylaxis, HTN, Cdiff, AMS.     PT Comments    Pt noted to not be moving LLE to command at all. Pt moving other extremities to command but no movement noted in LLE. Pt lethargic but would arouse to stimuli and then would close eyes again if not stimulated. Per nursing pt lowered to the floor earlier this AM when trying to get up. Nursing now using maximove for transfers.  Continue to recommend SNF for further rehab.  Follow Up Recommendations  SNF     Equipment Recommendations  None recommended by PT    Recommendations for Other Services       Precautions / Restrictions Precautions Precautions: Fall Restrictions Weight Bearing Restrictions: No    Mobility  Bed Mobility               General bed mobility comments: Sitting OOB in recliner when therapy entered  Transfers                 General transfer comment: not safe to attempt at this time due to LE weakness  Ambulation/Gait             General Gait Details: unable   Stairs            Wheelchair Mobility    Modified Rankin (Stroke Patients Only)       Balance Overall balance assessment: Needs assistance Sitting-balance support: Feet supported;No upper extremity supported Sitting balance-Leahy Scale: Fair Sitting balance - Comments: restless, using BUE to shift back and forth - no LOB during session                                    Cognition Arousal/Alertness: Lethargic Behavior During Therapy: Restless Overall Cognitive Status: No family/caregiver present to determine baseline cognitive functioning                                  General Comments: Pt with one word responses, oriented to self, bday and place - moaning sometimes. Restless in the chair shifting weight back and forth. Following simple one step commands. Would arouse briefly but then quickly back to sleep      Exercises General Exercises - Lower Extremity Long Arc Quad: AAROM;Right;5 reps;Seated Other Exercises Other Exercises:  (limited participation)    General Comments        Pertinent Vitals/Pain Pain Assessment: Faces Faces Pain Scale: Hurts little more Pain Location: BLEs with movement and buttocks Pain Descriptors / Indicators: Shooting;Sharp;Grimacing Pain Intervention(s): Limited activity within patient's tolerance;Monitored during session;Repositioned    Home Living                      Prior Function            PT Goals (current goals can now be found in the care plan section) Acute Rehab PT Goals Patient Stated Goal: none stated Progress towards PT goals: Not progressing toward goals - comment (Severe LLE weakness and lethargy)    Frequency    Min 2X/week  PT Plan Current plan remains appropriate    Co-evaluation PT/OT/SLP Co-Evaluation/Treatment: Yes Reason for Co-Treatment: Complexity of the patient's impairments (multi-system involvement);For patient/therapist safety   OT goals addressed during session: ADL's and self-care;Strengthening/ROM      AM-PAC PT "6 Clicks" Daily Activity  Outcome Measure  Difficulty turning over in bed (including adjusting bedclothes, sheets and blankets)?: Unable Difficulty moving from lying on back to sitting on the side of the bed? : Unable Difficulty sitting down on and standing up from a chair with arms (e.g., wheelchair, bedside commode, etc,.)?: Unable Help needed moving to and from a bed to chair (including a wheelchair)?: Total Help needed walking in hospital room?: Total Help needed climbing 3-5 steps with a railing? :  Total 6 Click Score: 6    End of Session   Activity Tolerance: Patient limited by lethargy Patient left: in chair;with call bell/phone within reach;with chair alarm set Nurse Communication: Need for lift equipment;Other (comment) (LLE weakness) PT Visit Diagnosis: Muscle weakness (generalized) (M62.81);Other abnormalities of gait and mobility (R26.89) Pain - part of body: Leg     Time: [id number] PT Time Calculation (min) (ACUTE ONLY): 14 min  Charges:                       G Codes:       [facility] Ilisa Hayworth PT [phone]    Angelina OkCary W [facility] 04/11/2017, 2:08 PM

## 2017-04-11 NOTE — Progress Notes (Signed)
Floor staff reports they do not have any punch Bx trays.  I have ordered minor suture tray and supplies.  Will come back after they have supplies needed to do BX.

## 2017-04-11 NOTE — Progress Notes (Addendum)
Occupational Therapy Treatment Patient Details Name: Teresa KayserDiahann Dalton V. MRN: [id number] DOB: Oct 11, 1965 Today's Date: 04/11/2017    History of present illness 51 year old female with history of hypertension, type 2 diabetes, chronic kidney disease stage IV in 2015 Presented with bilateral foot pain associated with lower extremity edema. New onset ESRD, Calicihylaxis, HTN, Cdiff, AMS.   OT comments  Pt limited by lethargy this session, but agreeable to minimal exercises in the chair. PT/OT deemed unsafe for transfer with anything other than lift due to LLE weakness (see PT note). Pt with lethargy throughout session, minimal interaction, but did follow simple one step commands. OT will continue to follow in acute setting. SNF continues to be appropriate.   Follow Up Recommendations  SNF;Supervision/Assistance - 24 hour    Equipment Recommendations  3 in 1 bedside commode    Recommendations for Other Services      Precautions / Restrictions Precautions Precautions: Fall Restrictions Weight Bearing Restrictions: No       Mobility Bed Mobility               General bed mobility comments: Sitting OOB in recliner when therapy entered  Transfers                 General transfer comment: not safe to attempt at this time    Balance Overall balance assessment: Needs assistance Sitting-balance support: Feet supported;No upper extremity supported Sitting balance-Leahy Scale: Fair Sitting balance - Comments: restless, using BUE to shift back and forth - no LOB during session                                   ADL either performed or assessed with clinical judgement   ADL Overall ADL's : Needs assistance/impaired     Grooming: Wash/dry hands;Wash/dry face;Set up;Sitting Grooming Details (indicate cue type and reason): in recliner                                     Vision       Perception     Praxis      Cognition  Arousal/Alertness: Lethargic Behavior During Therapy: Restless Overall Cognitive Status: No family/caregiver present to determine baseline cognitive functioning                                 General Comments: Pt with one word responses, oriented to self, bday and place - moaning sometimes. Restless in the chair shifting weight back and forth. Following simple one step commands        Exercises Exercises: Other exercises Other Exercises Other Exercises: general BUE AROM ex. Encouraged BUE EX (limited participation)   Shoulder Instructions       General Comments      Pertinent Vitals/ Pain       Pain Assessment: Faces Faces Pain Scale: Hurts little more Pain Location: BLEs with movement Pain Descriptors / Indicators: Shooting;Sharp;Grimacing Pain Intervention(s): Monitored during session;Limited activity within patient's tolerance;Repositioned  Home Living                                          Prior Functioning/Environment  Frequency  Min 2X/week        Progress Toward Goals  OT Goals(current goals can now be found in the care plan section)  Progress towards OT goals: Not progressing toward goals - comment (limited by lethargy/weakness)  Acute Rehab OT Goals Patient Stated Goal: none stated OT Goal Formulation: With patient Time For Goal Achievement: 04/20/17 Potential to Achieve Goals: Fair  Plan Discharge plan remains appropriate;Frequency remains appropriate    Co-evaluation    PT/OT/SLP Co-Evaluation/Treatment: Yes Reason for Co-Treatment: Complexity of the patient's impairments (multi-system involvement);For patient/therapist safety;To address functional/ADL transfers   OT goals addressed during session: ADL's and self-care;Strengthening/ROM      AM-PAC PT "6 Clicks" Daily Activity     Outcome Measure   Help from another person eating meals?: None Help from another person taking care of personal  grooming?: None Help from another person toileting, which includes using toliet, bedpan, or urinal?: A Lot Help from another person bathing (including washing, rinsing, drying)?: A Lot Help from another person to put on and taking off regular upper body clothing?: A Little Help from another person to put on and taking off regular lower body clothing?: A Lot 6 Click Score: 17    End of Session    OT Visit Diagnosis: Unsteadiness on feet (R26.81);Other abnormalities of gait and mobility (R26.89);Pain;Muscle weakness (generalized) (M62.81) Pain - part of body: Knee;Leg (Bil)   Activity Tolerance Patient limited by lethargy   Patient Left in chair;with call bell/phone within reach;with chair alarm set   Nurse Communication Mobility status        Time: 9147-8295 OT Time Calculation (min): 15 min  Charges: OT General Charges $OT Visit: 1 Visit OT Treatments $Self Care/Home Management : 8-22 mins  Sherryl Manges OTR/L [phone]  Teresa Dalton 04/11/2017, 1:32 PM

## 2017-04-11 NOTE — Clinical Social Work Note (Signed)
CSW attempted to talk with patient at 12:20 pm regarding discharge disposition. Patient was sitting up in a chair at bedside and was very lethargic. CSW attempted to talk with patient, however she would not respond to my questions and the said "Oh my tail, tail hurt!". Patient informed that CSW will return to talk with her later. CSW advised by nursing and OT that patient remains lethargic.  Genelle BalVanessa Melda Mermelstein, MSW, LCSW Licensed Clinical Social Worker Clinical Social Work Department [facility] [phone]

## 2017-04-11 NOTE — Progress Notes (Signed)
Teresa Dalton Progress Note    Assessment/ Plan:   1. ESRD - TTS schedule.  [facility]  2. Calciphylaxis: gen surg consulted for bx, will need assiduous wound care as well (? Debridement) have started Na thiosulfate (10/30-) TIW with dialysis  3. Peripheral arterial disease: planning for angio with possible intervention Friday per vascular surgery 4. HTN - continue amlodipine 5. Anemia - cont ESA and IV iron with HD 6. Mineral metabolic disorder - continue lanthanum, added Velphoro today 7. DM - per primary 8. Obesity - will need continued encouragement  9. Hyperlipidemia - continue simvastatin  10. C diff: on po vanc   Subjective:    Reports pain in both calves.   Objective:   BP (!) 128/58 (BP Location: Right Arm)   Pulse 80   Temp 98.6 F (37 C) (Oral)   Resp (!) 22   Ht 5\' 5"  (1.651 m)   Wt 108 kg (238 lb 1.6 oz)   LMP 03/26/2017   SpO2 92%   BMI 39.62 kg/m   Intake/Output Summary (Last 24 hours) at 04/11/17 1242 Last data filed at 04/11/17 0929  Gross per 24 hour  Intake              290 ml  Output             3319 ml  Net            -3029 ml   Weight change: 5.2 kg (11 lb 7.4 oz)  Physical Exam: UJW:[id number]:sitting in chair, falling asleep intermittently HEENT: EOMI, sclerae anicteric CVS: regular rate and rhythm. Resp: clear bilaterally. Abd: soft, non tender, obese, bowel sounds positive.  Ext: 2+ lower extremity edema with multiple violaceous, indurated, painful nodules on bilateral shins/ calves with  R > L ACCESS: New aVF LUA, PC R IJ  Imaging: No results found.  Labs: BMET  Recent Labs Lab 03/23/2017 0353 04/08/17 0120 04/09/17 0209 04/10/17 0808 04/11/17 0833  NA 142 136 135 136 136  K 4.4 4.9 4.6 4.8 5.0  CL 103 97* 95* 96* 96*  CO2 24 24 25 22  21*  GLUCOSE 96 95 114* 88 100*  BUN 44* 48* 34* 56* 41*  CREATININE 9.17* 10.51* 7.73* 10.01* 7.54*  CALCIUM 9.3 9.0 9.5 9.1 9.9  PHOS  --  7.8* 7.6* 9.2*   --    CBC  Recent Labs Lab 04/08/17 0115 04/09/17 0209 04/10/17 0808 04/11/17 0833  WBC 16.3* 16.8* 14.2* 16.4*  NEUTROABS  --  13.1*  --  12.1*  HGB 7.2* 8.0* 7.3* 8.3*  HCT 23.6* 25.7* 24.3* 26.9*  MCV 94.4 94.5 93.8 93.4  PLT 264 278 278 318    Medications:    . amLODipine  10 mg Oral QHS  . darbepoetin (ARANESP) injection - DIALYSIS  200 mcg Intravenous Q Mon-HD  . gabapentin  300 mg Oral Daily  . heparin subcutaneous  5,000 Units Subcutaneous Q8H  . insulin aspart  0-15 Units Subcutaneous TID WC  . insulin detemir  32 Units Subcutaneous BID  . labetalol  200 mg Oral BID  . lanthanum  1,000 mg Oral TID WC  . lidocaine-EPINEPHrine  10 mL Intradermal Once  . methocarbamol  500 mg Oral TID  . multivitamin  1 tablet Oral QHS  . simvastatin  20 mg Oral Daily  . sucroferric oxyhydroxide  1,000 mg Oral TID WC  . vancomycin  125 mg Oral QID  Bufford Buttner MD [facility] pgr [phone] 04/11/2017, 12:42 PM

## 2017-04-12 ENCOUNTER — Inpatient Hospital Stay (HOSPITAL_COMMUNITY): Payer: BLUE CROSS/BLUE SHIELD

## 2017-04-12 LAB — GLUCOSE, CAPILLARY
GLUCOSE-CAPILLARY: 84 mg/dL (ref 65–99)
GLUCOSE-CAPILLARY: 98 mg/dL (ref 65–99)
Glucose-Capillary: 99 mg/dL (ref 65–99)

## 2017-04-12 LAB — BLOOD GAS, ARTERIAL
Acid-base deficit: 2.8 mmol/L — ABNORMAL HIGH (ref 0.0–2.0)
BICARBONATE: 21.3 mmol/L (ref 20.0–28.0)
Drawn by: 12971
FIO2: 21
O2 SAT: 92.6 %
PATIENT TEMPERATURE: 98.6
PO2 ART: 74.4 mmHg — AB (ref 83.0–108.0)
pCO2 arterial: 35.5 mmHg (ref 32.0–48.0)
pH, Arterial: 7.396 (ref 7.350–7.450)

## 2017-04-12 LAB — CBC WITH DIFFERENTIAL/PLATELET
BASOS ABS: 0.1 10*3/uL (ref 0.0–0.1)
BASOS PCT: 0 %
EOS PCT: 2 %
Eosinophils Absolute: 0.3 10*3/uL (ref 0.0–0.7)
HCT: 27.1 % — ABNORMAL LOW (ref 36.0–46.0)
Hemoglobin: 8.6 g/dL — ABNORMAL LOW (ref 12.0–15.0)
Lymphocytes Relative: 9 %
Lymphs Abs: 1.8 10*3/uL (ref 0.7–4.0)
MCH: 29.4 pg (ref 26.0–34.0)
MCHC: 31.7 g/dL (ref 30.0–36.0)
MCV: 92.5 fL (ref 78.0–100.0)
MONO ABS: 2.5 10*3/uL — AB (ref 0.1–1.0)
Monocytes Relative: 12 %
NEUTROS ABS: 15.7 10*3/uL — AB (ref 1.7–7.7)
Neutrophils Relative %: 77 %
PLATELETS: 386 10*3/uL (ref 150–400)
RBC: 2.93 MIL/uL — ABNORMAL LOW (ref 3.87–5.11)
RDW: 14.6 % (ref 11.5–15.5)
WBC: 20.4 10*3/uL — ABNORMAL HIGH (ref 4.0–10.5)

## 2017-04-12 LAB — BASIC METABOLIC PANEL
ANION GAP: 21 — AB (ref 5–15)
BUN: 67 mg/dL — ABNORMAL HIGH (ref 6–20)
CALCIUM: 10.4 mg/dL — AB (ref 8.9–10.3)
CO2: 21 mmol/L — ABNORMAL LOW (ref 22–32)
Chloride: 94 mmol/L — ABNORMAL LOW (ref 101–111)
Creatinine, Ser: 9.03 mg/dL — ABNORMAL HIGH (ref 0.44–1.00)
GFR, EST AFRICAN AMERICAN: 5 mL/min — AB (ref >60)
GFR, EST NON AFRICAN AMERICAN: 5 mL/min — AB (ref >60)
Glucose, Bld: 74 mg/dL (ref 65–99)
Potassium: 5.5 mmol/L — ABNORMAL HIGH (ref 3.5–5.1)
SODIUM: 136 mmol/L (ref 135–145)

## 2017-04-12 MED ORDER — DEXTROSE 50 % IV SOLN
INTRAVENOUS | Status: AC
  Administered 2017-04-12: 50 mL via INTRAVENOUS
  Filled 2017-04-12: qty 50

## 2017-04-12 MED ORDER — HYDROCODONE-ACETAMINOPHEN 5-325 MG PO TABS
ORAL_TABLET | ORAL | Status: AC
  Administered 2017-04-12: 1 via ORAL
  Filled 2017-04-12: qty 1

## 2017-04-12 MED ORDER — DEXTROSE 50 % IV SOLN
1.0000 | Freq: Once | INTRAVENOUS | Status: AC
  Administered 2017-04-12: 50 mL via INTRAVENOUS

## 2017-04-12 MED ORDER — DARBEPOETIN ALFA 200 MCG/0.4ML IJ SOSY
PREFILLED_SYRINGE | INTRAMUSCULAR | Status: AC
  Filled 2017-04-12: qty 0.4

## 2017-04-12 NOTE — Progress Notes (Signed)
[facility] Progress Note    Assessment/ Plan:   1. ESRD - TTS schedule.  [facility].  Tolerating well.  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 Debridement) have started Na thiosulfate (10/30-) TIW with dialysis  3. Peripheral arterial disease: planning for angio with possible intervention Friday per vascular surgery 4. HTN - continue amlodipine 5. 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 DM - per primary 8. Obesity - will need continued encouragement  9. Hyperlipidemia - continue simvastatin  10. C diff: on po vanc   Subjective:    For angiogram tomorrow.  Biopsy of leg wounds yesterday.     Objective:   BP 136/75   Pulse 83   Temp 98 F (36.7 C) (Oral)   Resp 19   Ht 5\' 5"  (1.651 m)   Wt 107.3 kg (236 lb 8.9 oz)   LMP 03/26/2017   SpO2 96%   BMI 39.36 kg/m   Intake/Output Summary (Last 24 hours) at 04/12/17 1233 Last data filed at 04/12/17 1125  Gross per 24 hour  Intake                0 ml  Output              691 ml  Net             -691 ml   Weight change: -5.1 kg (-11 lb 3.9 oz)  Physical Exam: Gen: lying in bed HEENT: EOMI, sclerae anicteric CVS: regular rate and rhythm. Resp: clear bilaterally. Abd: soft, non tender, obese, bowel sounds positive.  Ext: 2+ lower extremity edema with multiple violaceous, indurated, painful nodules on bilateral shins/ calves with  R > L ACCESS: New aVF LUA, PC R IJ  Imaging: Ct Head Wo Contrast  Result Date: 04/11/2017 CLINICAL DATA:  51 y/o  F; altered mental status. EXAM: CT HEAD WITHOUT CONTRAST TECHNIQUE: Contiguous axial images were obtained from the base of the skull through the vertex without intravenous contrast. COMPARISON:  None. FINDINGS: Brain: No evidence of acute infarction, hemorrhage, hydrocephalus, extra-axial collection or mass  lesion/mass effect. Vascular: Calcific atherosclerosis of carotid siphons. No hyperdense vessel. Skull: Normal. Negative for fracture or focal lesion. Sinuses/Orbits: No acute finding. Other: None. IMPRESSION: 1. No acute intracranial abnormality. 2. Advanced calcific atherosclerosis of carotid siphons. 3. Otherwise unremarkable CT of the head. Electronically Signed   By: Mitzi HansenLance  Furusawa-Stratton M.D.   On: 04/11/2017 18:34    Labs: BMET  Recent Labs Lab 04/08/17 0120 04/09/17 0209 04/10/17 0808 04/11/17 0833 04/12/17 0406  NA 136 135 136 136 136  K 4.9 4.6 4.8 5.0 5.5*  CL 97* 95* 96* 96* 94*  CO2 24 25 22  21* 21*  GLUCOSE 95 114* 88 100* 74  BUN 48* 34* 56* 41* 67*  CREATININE 10.51* 7.73* 10.01* 7.54* 9.03*  CALCIUM 9.0 9.5 9.1 9.9 10.4*  PHOS 7.8* 7.6* 9.2*  --   --    CBC  Recent Labs Lab 04/09/17 0209 04/10/17 0808 04/11/17 0833 04/12/17 0406  WBC 16.8* 14.2* 16.4* 20.4*  NEUTROABS 13.1*  --  12.1* 15.7*  HGB 8.0* 7.3* 8.3* 8.6*  HCT 25.7* 24.3* 26.9* 27.1*  MCV 94.5 93.8 93.4 92.5  PLT 278 278 318 386    Medications:    . amLODipine  10 mg Oral QHS  .  bacitracin   Topical Once  . Darbepoetin Alfa      . darbepoetin (ARANESP) injection - DIALYSIS  200 mcg Intravenous Q Thu-HD  . gabapentin  300 mg Oral Daily  . heparin subcutaneous  5,000 Units Subcutaneous Q8H  . insulin aspart  0-15 Units Subcutaneous TID WC  . insulin detemir  32 Units Subcutaneous BID  . labetalol  200 mg Oral BID  . lanthanum  1,000 mg Oral TID WC  . lidocaine-EPINEPHrine  10 mL Intradermal Once  . multivitamin  1 tablet Oral QHS  . simvastatin  20 mg Oral Daily  . sucroferric oxyhydroxide  1,000 mg Oral TID WC  . vancomycin  125 mg Oral QID      Bufford Buttner MD [facility] pgr [phone] 04/12/2017, 12:33 PM

## 2017-04-12 NOTE — Progress Notes (Signed)
BG 144

## 2017-04-12 NOTE — Progress Notes (Signed)
Hemodialysis- Patient agitated. Rolling all over bed. Continuing to explain the importance of remaining still throughout the procedure. Repositioned patient for comfort. Given warm blankets and tv. Discussed safety with patient. Had to reduce BFR to 300 due to constant machine alarming. Continue to monitor closely.

## 2017-04-12 NOTE — Progress Notes (Signed)
HD initiated via R Chest HD catheter without issue. Labs predrawn in am. Patient currently sleeping without complaints. 4L goal as ordered. Continue to monitor closely

## 2017-04-12 NOTE — Progress Notes (Signed)
PROGRESS NOTE  Teresa Dalton RUE:[id number] DOB: [birth year] DOA: 03/14/2017 PCP: Patient, No Pcp Per  HPI/Recap of past 43 hours: 51 year old female with history of hypertension, diabetes, hyperlipidemia, CKD, presents with bilateral foot pain associated with lower extremity edema.  Patient is currently being managed for end-stage renal disease on dialysis.  Patient also noted to be C. difficile positive  Today, patient noted to be more alert oriented x3.  Patient continues to complain of bilateral leg pain worse on the left.  Patient denied any other symptoms denied chest pain, abdominal pain, worsening shortness of breath, cough, nausea vomiting.  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 difficile diarrhea  #New onset end-stage renal disease Currently on hemodialysis T/T/SA via tunneled catheter Status post  AV fistula by vascular on 10/25 Nephrology on board Outpatient hemodialysis is arranged  #Bilateral leg pain Likely from calciphylaxis Bilateral Doppler negative for DVT X-ray with subcutaneous edema.  ABI vascular study incomplete Skin punch biopsy done on 10/31 by general surgery pending Vascular surgery recommending arteriogram on 11/2 Continue treatment for calciphylaxis during dialysis  #Lethargy Improved Unknown etiology, ongoing for several days CT head negative ?? Uremia Vs OSA Vs poly-pharmacy, r/o sepsis Adjusted pain meds,  Monitor closely  #Anemia of chronic kidney disease Continue IV iron and ESA during dialysis  #C. difficile diarrhea Afebrile, leukocytosis trending upwards We  will continue oral Vanco Consider ID consult  #Type 2 diabetes Continue current insulin regimen  #Hyperlipidemia Continue statins    Code Status: Full  Family Communication: None at bedside  Disposition Plan: Home once stable   Consultants:  Nephrologist  Vascular surgeon  General surgeon  Procedures:  AV fistula on 10/25  Antimicrobials:  P.o. vancomycin  DVT prophylaxis: Heparin   Objective: Vitals:   04/12/17 1125 04/12/17 1139 04/12/17 1741 04/12/17 2207  BP: 135/89 136/75 (!) 146/63 133/81  Pulse: 80 83 89 87  Resp: 20 19 20 19   Temp: 98 F (36.7 C)  98.2 F (36.8 C) 98.4 F (36.9 C)  TempSrc: Oral  Oral Oral  SpO2: 96%  100% 100%  Weight: 107.3 kg (236 lb 8.9 oz)   107.2 kg (236 lb 5.3 oz)  Height:        Intake/Output Summary (Last 24 hours) at 04/12/17 2234 Last data filed at 04/12/17 2219  Gross per 24 hour  Intake                0 ml  Output              691 ml  Net             -691 ml   Filed Weights   04/12/17 0651 04/12/17 1125 04/12/17 2207  Weight: 108 kg (238 lb 1.6 oz) 107.3 kg (236 lb 8.9 oz) 107.2 kg (236 lb 5.3 oz)    Exam:   General: Awake, alert, oriented x3  Cardiovascular: S1-S2 present, no added heart sounds  Respiratory: Chest clear bilaterally  Abdomen: Soft, nontender, nondistended  Musculoskeletal: Trace bilateral pedal edema, superficial wound on the  left lower extremity  Skin: Normal  Psychiatry: Normal mood   Data Reviewed: CBC:  Recent Labs Lab 04/08/17 0115 04/09/17 0209 04/10/17 0808 04/11/17 0833 04/12/17 0406  WBC 16.3* 16.8* 14.2* 16.4* 20.4*  NEUTROABS  --  13.1*  --  12.1* 15.7*  HGB 7.2* 8.0* 7.3* 8.3* 8.6*  HCT 23.6* 25.7* 24.3* 26.9* 27.1*  MCV 94.4 94.5 93.8 93.4 92.5  PLT 264 278 278 318 386   Basic Metabolic Panel:  Recent Labs Lab 04/08/17 0120 04/09/17 0209 04/10/17 0808 04/11/17 0833 04/12/17 0406  NA 136 135 136 136 136  K 4.9 4.6 4.8 5.0 5.5*  CL 97* 95*  96* 96* 94*  CO2 24 25 22  21* 21*  GLUCOSE 95 114* 88 100* 74  BUN 48* 34* 56* 41* 67*  CREATININE 10.51* 7.73* 10.01* 7.54* 9.03*  CALCIUM 9.0 9.5 9.1 9.9 10.4*  PHOS 7.8* 7.6* 9.2*  --   --    GFR: Estimated Creatinine Clearance: 9.2 mL/min (A) (by C-G formula based on SCr of 9.03 mg/dL (H)). Liver Function Tests:  Recent Labs Lab 04/08/17 0120 04/09/17 0209 04/10/17 0808  ALBUMIN 2.5* 2.8* 2.6*   No results for input(s): LIPASE, AMYLASE in the last 168 hours. No results for input(s): AMMONIA in the last 168 hours. Coagulation Profile: No results for input(s): INR, PROTIME in the last 168 hours. Cardiac Enzymes:  Recent Labs Lab 04/09/17 1412  CKTOTAL 515*   BNP (last 3 results) No results for input(s): PROBNP in the last 8760 hours. HbA1C: No results for input(s): HGBA1C in the last 72 hours. CBG:  Recent Labs Lab 04/11/17 1734 04/11/17 2157 04/12/17 0204 04/12/17 1738 04/12/17 2206  GLUCAP 125* 94 84 98 99   Lipid Profile: No results for input(s): CHOL, HDL, LDLCALC, TRIG, CHOLHDL, LDLDIRECT in the last 72 hours. Thyroid Function Tests: No results for input(s): TSH, T4TOTAL, FREET4, T3FREE, THYROIDAB in the last 72 hours. Anemia Panel: No results for input(s): VITAMINB12, FOLATE, FERRITIN, TIBC, IRON, RETICCTPCT in the last 72 hours. Urine analysis:    Component Value Date/Time   COLORURINE YELLOW 04/10/2017 0540   APPEARANCEUR HAZY (A) 04/10/2017 0540   LABSPEC 1.000 (L) 04/10/2017 0540   PHURINE 7.0 04/10/2017 0540   GLUCOSEU NEGATIVE 04/10/2017 0540   HGBUR MODERATE (A) 04/10/2017 0540   BILIRUBINUR NEGATIVE 04/10/2017 0540   KETONESUR NEGATIVE 04/10/2017 0540   PROTEINUR 30 (A) 04/10/2017 0540   UROBILINOGEN 0.2 05/30/2010 1628   NITRITE NEGATIVE 04/10/2017 0540   LEUKOCYTESUR NEGATIVE 04/10/2017 0540   Sepsis Labs: @LABRCNTIP (procalcitonin:4,lacticidven:4)  ) Recent Results (from the past 240 hour(s))  Culture, blood (Routine X 2) w  Reflex to ID Panel     Status: None   Collection Time: 04/03/17  7:01 PM  Result Value Ref Range Status   Specimen Description BLOOD LEFT WRIST  Final   Special Requests   Final    BOTTLES DRAWN AEROBIC AND ANAEROBIC Blood Culture adequate volume   Culture NO GROWTH 5 DAYS  Final   Report Status 04/08/2017 FINAL  Final  Culture, blood (Routine X 2) w Reflex to ID Panel     Status: None   Collection Time: 04/03/17  7:01 PM  Result Value Ref Range Status   Specimen Description BLOOD RIGHT ANTECUBITAL  Final   Special Requests   Final    BOTTLES DRAWN AEROBIC AND ANAEROBIC Blood Culture results may not be optimal due to an excessive volume of blood received in culture bottles   Culture NO  GROWTH 5 DAYS  Final   Report Status 04/08/2017 FINAL  Final  Surgical pcr screen     Status: None   Collection Time: 2017/04/22  7:43 AM  Result Value Ref Range Status   MRSA, PCR NEGATIVE NEGATIVE Final   Staphylococcus aureus NEGATIVE NEGATIVE Final    Comment: (NOTE) The Xpert SA Assay (FDA approved for NASAL specimens in patients 42 years of age and older), is one component of a comprehensive surveillance program. It is not intended to diagnose infection nor to guide or monitor treatment.   C difficile quick scan w PCR reflex     Status: Abnormal   Collection Time: 04/09/17 11:52 AM  Result Value Ref Range Status   C Diff antigen POSITIVE (A) NEGATIVE Final   C Diff toxin NEGATIVE NEGATIVE Final   C Diff interpretation Results are indeterminate. See PCR results.  Final  Clostridium Difficile by PCR     Status: Abnormal   Collection Time: 04/09/17 11:52 AM  Result Value Ref Range Status   Toxigenic C Difficile by pcr POSITIVE (A) NEGATIVE Final    Comment: Positive for toxigenic C. difficile with little to no toxin production. Only treat if clinical presentation suggests symptomatic illness.      Studies: Dg Chest 2 View  Result Date: 04/12/2017 CLINICAL DATA:  Elevated white blood cell  count, mental status change. History of diabetes, end-stage renal disease on dialysis. Current smoker. EXAM: CHEST  2 VIEW COMPARISON:  Chest x-ray of April 03, 2017 FINDINGS: The lungs are adequately inflated. There is no focal infiltrate. The cardiac silhouette is enlarged. The pulmonary vascularity is normal. The dialysis catheter tip projects over the distal third of the SVC. IMPRESSION: Stable cardiomegaly without pulmonary vascular congestion or pulmonary edema. No acute pneumonia. Electronically Signed   By: David  [place] M.D.   On: 04/12/2017 13:21    Scheduled Meds: . amLODipine  10 mg Oral QHS  . bacitracin   Topical Once  . darbepoetin (ARANESP) injection - DIALYSIS  200 mcg Intravenous Q Thu-HD  . gabapentin  300 mg Oral Daily  . heparin subcutaneous  5,000 Units Subcutaneous Q8H  . insulin aspart  0-15 Units Subcutaneous TID WC  . insulin detemir  32 Units Subcutaneous BID  . labetalol  200 mg Oral BID  . lanthanum  1,000 mg Oral TID WC  . lidocaine-EPINEPHrine  10 mL Intradermal Once  . multivitamin  1 tablet Oral QHS  . simvastatin  20 mg Oral Daily  . sucroferric oxyhydroxide  1,000 mg Oral TID WC  . vancomycin  125 mg Oral QID    Continuous Infusions: . sodium chloride 10 mL/hr at 04/22/2017 0844  . ferric gluconate (FERRLECIT/NULECIT) IV 125 mg (04/10/17 1246)  . sodium thiosulfate infusion for calciphylaxis Stopped (04/12/17 1455)     LOS: 13 days     Briant Cedar, MD [facility]   If 7PM-7AM, please contact night-coverage [email or web address] Password [email or web address] 04/12/2017, 10:34 PM

## 2017-04-12 NOTE — Progress Notes (Signed)
Patient ID: Teresa Trachtenberg V., female   DOB: 05/05/1966, 51 y.o.   MRN: [id number] Scheduled for arteriogram tomorrow to define anatomy to her lower extremities.  Treatment of severe calciphylaxis per primary team 

## 2017-04-12 NOTE — Progress Notes (Signed)
Patient ID: Teresa Kayseriahann Elsasser V., female   DOB: [birth year], 51 y.o.   MRN: [id number]  [facility]  7 Days Post-Op  Subjective: CC-  Patient currently in dialysis. No complaints about punch biopsy site from yesterday.  Objective: Vital signs in last 24 hours: Temp:  [98 F (36.7 C)-98.3 F (36.8 C)] 98 F (36.7 C) (11/01 1125) Pulse Rate:  [73-87] 83 (11/01 1139) Resp:  [18-23] 19 (11/01 1139) BP: (92-148)/(41-92) 136/75 (11/01 1139) SpO2:  [91 %-100 %] 96 % (11/01 1125) Weight:  [236 lb 8.9 oz (107.3 kg)-238 lb 1.6 oz (108 kg)] 236 lb 8.9 oz (107.3 kg) (11/01 1125) Last BM Date: 04/10/17  Intake/Output from previous day: 10/31 0701 - 11/01 0700 In: 120 [P.O.:120] Out: 0  Intake/Output this shift: Total I/O In: -  Out: 691 [Other:691]  PE: Gen:  Alert, NAD, drowsy HEENT: EOM's intact, pupils equal and round Pulm:  effort normal Abd: Soft, NT/ND, +BS RLE:  Distal medial aspect of lower leg with multiple small/firm/black eschars and 1 large necrotic ulceration where punch biopsy was taken at margin, site is clean/dry with no drainage or surrounding erythema  Lab Results:   Recent Labs  04/11/17 0833 04/12/17 0406  WBC 16.4* 20.4*  HGB 8.3* 8.6*  HCT 26.9* 27.1*  PLT 318 386   BMET  Recent Labs  04/11/17 0833 04/12/17 0406  NA 136 136  K 5.0 5.5*  CL 96* 94*  CO2 21* 21*  GLUCOSE 100* 74  BUN 41* 67*  CREATININE 7.54* 9.03*  CALCIUM 9.9 10.4*   PT/INR No results for input(s): LABPROT, INR in the last 72 hours. CMP     Component Value Date/Time   NA 136 04/12/2017 0406   K 5.5 (H) 04/12/2017 0406   CL 94 (L) 04/12/2017 0406   CO2 21 (L) 04/12/2017 0406   GLUCOSE 74 04/12/2017 0406   BUN 67 (H) 04/12/2017 0406   CREATININE 9.03 (H) 04/12/2017 0406   CREATININE 1.40 (H) 07/21/2011 1429   CALCIUM 10.4 (H) 04/12/2017 0406   PROT 6.8 03/31/2017 1937   ALBUMIN 2.6 (L) 04/10/2017 0808   AST 11 (L) 04/09/2017 1937   ALT 11  (L) 03/21/2017 1937   ALKPHOS 118 04/10/2017 1937   BILITOT 0.8 04/08/2017 1937   GFRNONAA 5 (L) 04/12/2017 0406   GFRAA 5 (L) 04/12/2017 0406   Lipase  No results found for: LIPASE     Studies/Results: Ct Head Wo Contrast  Result Date: 04/11/2017 CLINICAL DATA:  51 y/o  F; altered mental status. EXAM: CT HEAD WITHOUT CONTRAST TECHNIQUE: Contiguous axial images were obtained from the base of the skull through the vertex without intravenous contrast. COMPARISON:  None. FINDINGS: Brain: No evidence of acute infarction, hemorrhage, hydrocephalus, extra-axial collection or mass lesion/mass effect. Vascular: Calcific atherosclerosis of carotid siphons. No hyperdense vessel. Skull: Normal. Negative for fracture or focal lesion. Sinuses/Orbits: No acute finding. Other: None. IMPRESSION: 1. No acute intracranial abnormality. 2. Advanced calcific atherosclerosis of carotid siphons. 3. Otherwise unremarkable CT of the head. Electronically Signed   By: Mitzi HansenLance  Furusawa-Stratton M.D.   On: 04/11/2017 18:34    Anti-infectives: Anti-infectives    Start     Dose/Rate Route Frequency Ordered Stop   04/10/17 0000  vancomycin (VANCOCIN) 50 mg/mL oral solution 125 mg     125 mg Oral 4 times daily 04/09/17 2325 04/19/17 2159   09-13-16 1000  ceFAZolin (ANCEF) IVPB 1 g/50 mL premix    Comments:  Send  with pt to OR   1 g 100 mL/hr over 30 Minutes Intravenous To Surgery 04/04/17 1112 03/16/2017 1100   03/31/17 1100  ceFAZolin (ANCEF) IVPB 2g/100 mL premix     2 g 200 mL/hr over 30 Minutes Intravenous  Once 03/31/17 1045 03/31/17 1113   03/31/17 1037  ceFAZolin (ANCEF) 2-4 GM/100ML-% IVPB    Comments:  Ferd Hibbs   : cabinet override      03/31/17 1037 03/31/17 2244       Assessment/Plan Bilateral leg wounds/pain - suspect calciphylaxis, currently on Na thiosulfate - s/p punch biopsy 10/31. Site is clean with no signs of infection, dressing changed. Change dressing daily until wound closes. General  surgery will sign off. Please call with concerns.    LOS: 13 days    Franne Forts , [facility] 04/12/2017, 12:50 PM Pager: [phone] Consults: [phone] Mon-Fri 7:00 am-4:30 pm Sat-Sun 7:00 am-11:30 am

## 2017-04-12 NOTE — Progress Notes (Signed)
Hemodialysis- Just prior to rinseback patient awoke and became very restless, diaphoretic. CBG checked=53. Attempted to give patient juice but would not drink much. d50 given. Will recheck before sending back to floor. Vitals stable and patient currently sleeping.

## 2017-04-12 NOTE — Procedures (Signed)
Patient seen and examined on Hemodialysis this morning. QB 400 via TDC UF goal 2L Treatment adjusted as needed.  Teresa ButtnerElizabeth Dallan Schonberg MD [facility] pgr [phone] 12:36 PM

## 2017-04-12 NOTE — Progress Notes (Signed)
Medicated for pain. Continue to monitor.

## 2017-04-12 DEATH — deceased

## 2017-04-13 ENCOUNTER — Inpatient Hospital Stay (HOSPITAL_COMMUNITY): Payer: BLUE CROSS/BLUE SHIELD

## 2017-04-13 ENCOUNTER — Encounter (HOSPITAL_COMMUNITY): Admission: EM | Disposition: E | Payer: Self-pay | Source: Home / Self Care | Attending: Nephrology

## 2017-04-13 DIAGNOSIS — Z833 Family history of diabetes mellitus: Secondary | ICD-10-CM

## 2017-04-13 DIAGNOSIS — F17210 Nicotine dependence, cigarettes, uncomplicated: Secondary | ICD-10-CM

## 2017-04-13 DIAGNOSIS — E785 Hyperlipidemia, unspecified: Secondary | ICD-10-CM

## 2017-04-13 DIAGNOSIS — Z8249 Family history of ischemic heart disease and other diseases of the circulatory system: Secondary | ICD-10-CM

## 2017-04-13 DIAGNOSIS — A419 Sepsis, unspecified organism: Secondary | ICD-10-CM

## 2017-04-13 DIAGNOSIS — E079 Disorder of thyroid, unspecified: Secondary | ICD-10-CM

## 2017-04-13 DIAGNOSIS — D729 Disorder of white blood cells, unspecified: Secondary | ICD-10-CM

## 2017-04-13 LAB — CBC WITH DIFFERENTIAL/PLATELET
BASOS ABS: 0 10*3/uL (ref 0.0–0.1)
BASOS PCT: 0 %
Eosinophils Absolute: 0.3 10*3/uL (ref 0.0–0.7)
Eosinophils Relative: 1 %
HCT: 27.5 % — ABNORMAL LOW (ref 36.0–46.0)
HEMOGLOBIN: 8.7 g/dL — AB (ref 12.0–15.0)
LYMPHS PCT: 10 %
Lymphs Abs: 3 10*3/uL (ref 0.7–4.0)
MCH: 28.9 pg (ref 26.0–34.0)
MCHC: 31.6 g/dL (ref 30.0–36.0)
MCV: 91.4 fL (ref 78.0–100.0)
MONOS PCT: 10 %
Monocytes Absolute: 3 10*3/uL — ABNORMAL HIGH (ref 0.1–1.0)
NEUTROS ABS: 23.7 10*3/uL — AB (ref 1.7–7.7)
Neutrophils Relative %: 79 %
Platelets: 325 10*3/uL (ref 150–400)
RBC: 3.01 MIL/uL — ABNORMAL LOW (ref 3.87–5.11)
RDW: 14.7 % (ref 11.5–15.5)
WBC: 30 10*3/uL — ABNORMAL HIGH (ref 4.0–10.5)

## 2017-04-13 LAB — BASIC METABOLIC PANEL
ANION GAP: 35 — AB (ref 5–15)
ANION GAP: 28 — AB (ref 5–15)
BUN: 45 mg/dL — ABNORMAL HIGH (ref 6–20)
BUN: 70 mg/dL — AB (ref 6–20)
CALCIUM: 10.2 mg/dL (ref 8.9–10.3)
CO2: 17 mmol/L — AB (ref 22–32)
CO2: 18 mmol/L — AB (ref 22–32)
CREATININE: 6.96 mg/dL — AB (ref 0.44–1.00)
Calcium: 11.1 mg/dL — ABNORMAL HIGH (ref 8.9–10.3)
Chloride: 89 mmol/L — ABNORMAL LOW (ref 101–111)
Chloride: 92 mmol/L — ABNORMAL LOW (ref 101–111)
Creatinine, Ser: 6.21 mg/dL — ABNORMAL HIGH (ref 0.44–1.00)
GFR calc Af Amer: 8 mL/min — ABNORMAL LOW (ref >60)
GFR calc Af Amer: 7 mL/min — ABNORMAL LOW (ref >60)
GFR calc non Af Amer: 6 mL/min — ABNORMAL LOW (ref >60)
GFR, EST NON AFRICAN AMERICAN: 7 mL/min — AB (ref >60)
GLUCOSE: 96 mg/dL (ref 65–99)
GLUCOSE: 106 mg/dL — AB (ref 65–99)
POTASSIUM: 6.1 mmol/L — AB (ref 3.5–5.1)
Potassium: 5.2 mmol/L — ABNORMAL HIGH (ref 3.5–5.1)
Sodium: 141 mmol/L (ref 135–145)
Sodium: 138 mmol/L (ref 135–145)

## 2017-04-13 LAB — LACTIC ACID, PLASMA
LACTIC ACID, VENOUS: 5.6 mmol/L — AB (ref 0.5–1.9)
Lactic Acid, Venous: 2.3 mmol/L (ref 0.5–1.9)

## 2017-04-13 LAB — GLUCOSE, CAPILLARY
GLUCOSE-CAPILLARY: 53 mg/dL — AB (ref 65–99)
GLUCOSE-CAPILLARY: 144 mg/dL — AB (ref 65–99)
Glucose-Capillary: 142 mg/dL — ABNORMAL HIGH (ref 65–99)
Glucose-Capillary: 104 mg/dL — ABNORMAL HIGH (ref 65–99)
Glucose-Capillary: 104 mg/dL — ABNORMAL HIGH (ref 65–99)
Glucose-Capillary: 94 mg/dL (ref 65–99)

## 2017-04-13 LAB — PROCALCITONIN: PROCALCITONIN: 43.95 ng/mL

## 2017-04-13 LAB — POTASSIUM: POTASSIUM: 6.4 mmol/L — AB (ref 3.5–5.1)

## 2017-04-13 SURGERY — ABDOMINAL AORTOGRAM W/LOWER EXTREMITY
Anesthesia: LOCAL

## 2017-04-13 MED ORDER — PIPERACILLIN-TAZOBACTAM 3.375 G IVPB
3.3750 g | Freq: Two times a day (BID) | INTRAVENOUS | Status: DC
  Administered 2017-04-13: 3.375 g via INTRAVENOUS
  Filled 2017-04-13 (×2): qty 50

## 2017-04-13 MED ORDER — SODIUM CHLORIDE 0.9 % IV SOLN
INTRAVENOUS | Status: DC
  Administered 2017-04-13: 13:00:00 via INTRAVENOUS

## 2017-04-13 MED ORDER — PRO-STAT SUGAR FREE PO LIQD
30.0000 mL | Freq: Two times a day (BID) | ORAL | Status: DC
  Administered 2017-04-13 – 2017-04-15 (×2): 30 mL via ORAL
  Filled 2017-04-13 (×3): qty 30

## 2017-04-13 MED ORDER — VANCOMYCIN HCL 10 G IV SOLR
2500.0000 mg | Freq: Once | INTRAVENOUS | Status: DC
  Administered 2017-04-13: 2500 mg via INTRAVENOUS
  Filled 2017-04-13: qty 2500

## 2017-04-13 MED ORDER — NEPRO/CARBSTEADY PO LIQD
237.0000 mL | Freq: Two times a day (BID) | ORAL | Status: DC
  Administered 2017-04-13 – 2017-04-15 (×2): 237 mL via ORAL
  Filled 2017-04-13 (×9): qty 237

## 2017-04-13 MED ORDER — SODIUM POLYSTYRENE SULFONATE 15 GM/60ML PO SUSP: 30.0000 g | Freq: Once | ORAL | Status: DC

## 2017-04-13 MED ORDER — PATIROMER SORBITEX CALCIUM 8.4 G PO PACK
8.4000 g | PACK | Freq: Every day | ORAL | Status: DC
  Administered 2017-04-13 – 2017-04-17 (×4): 8.4 g via ORAL
  Filled 2017-04-13 (×6): qty 4

## 2017-04-13 NOTE — Progress Notes (Signed)
Called by Dr Signe ColtUpton.  Concerns for ongoing worsening sepsis.  Will cancel arteriogram today.  Will have Dr Randie Heinzain follow up with pt on Sunday to decided on when to reschedule.  Teresa Brunsharles [name], MD [facility] Office: [phone] Pager: [phone]

## 2017-04-13 NOTE — Progress Notes (Signed)
Spoke with Dr. Darrick PennaFields. He will call patients son and explain procedure, and receive verbal consent.

## 2017-04-13 NOTE — Progress Notes (Signed)
PT Cancellation Note  Patient Details Name: Roma KayserDiahann Wagar V. MRN: [id number] DOB: 1965-10-17   Cancelled Treatment:    Reason Eval/Treat Not Completed: Patient at procedure or test/unavailable. Transport present to take pt to HD soon after PT arrived. Will check back as schedule allows to continue with PT POC.    Marylynn PearsonLaura D Niasha Devins 04/16/2017, 10:36 AM   Conni SlipperLaura Danicia Terhaar, PT, DPT Acute Rehabilitation Services Pager: [phone]

## 2017-04-13 NOTE — Progress Notes (Signed)
Spoke with son about arteriogram today.  Risks benefits possible complications and procedure details discussed.  He is ok to proceed.  We will do telephone consent  Fabienne Brunsharles Fields, MD [facility] [place] Office: [phone] Pager: [phone]

## 2017-04-13 NOTE — Progress Notes (Signed)
Patient arrived to unit by bed.  Reviewed treatment plan and this RN agrees with plan.  Report received from bedside RN, Albin Fellingarla.  Consent verified.  Patient Alert and oriented to self, place.   Lung sounds diminished to ausculation in all fields. Generalized BLE edema. Cardiac:  NSr.  Removed caps and cleansed RIJ catheter with chlorhedxidine.  Aspirated ports of heparin and flushed them with saline per protocol.  Connected and secured lines, initiated treatment at 1053.  UF Goal of 1500 mL and net fluid removal 1 L.  Will continue to monitor.

## 2017-04-13 NOTE — Progress Notes (Signed)
Teresa Dalton Progress Note    Assessment/ Plan:   1. Leukocytosis, sepsis: increasing leukocytosis, abd pain, combative in HD today--> this needs eval before arteriogram in my opinion.  Exam with ? Area of fluctuance in her abdominal Raymond.  Cross sectional imaging recommended.  Blood cultures drawn in HD.  On PO vanc already for treatment of C diff, recommend broadening antibiotics too.  Will discuss case with Dr. Sharolyn Douglas and Dr. Darrick Penna.  2. ESRD - TTS schedule.  CLIP'd to [facility] when ready for d/c.  Had to come off today after 30 min due to combativeness.  Giving patiromer for hyperK  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 Debridement) have started Na thiosulfate (10/30-) TIW with dialysis  3. Peripheral arterial disease: planning for angio with possible intervention Friday per vascular surgery 4. HTN - continue amlodipine 5. 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 DM - per primary 8. Obesity - will need continued encouragement  9. Hyperlipidemia - continue simvastatin  10. C diff: on po vanc   Subjective:    K 6.4 today, short run of HD prescribed, supposed to go for arteriogram but was combative 30 min into dialysis and had to stop.     Objective:   BP 103/68   Pulse 77   Temp 98 F (36.7 C)   Resp 19   Ht 5\' 5"  (1.651 m)   Wt 109.7 kg (241 lb 13.5 oz)   LMP 03/26/2017   SpO2 98%   BMI 40.25 kg/m   Intake/Output Summary (Last 24 hours) at 04/16/2017 1142 Last data filed at 04/12/2017 0900  Gross per 24 hour  Intake               60 ml  Output                0 ml  Net               60 ml   Weight change: -0.2 kg (-7.1 oz)  Physical Exam: Gen: lying in bed HEENT: EOMI, sclerae anicteric CVS: regular rate and rhythm. Resp: clear bilaterally. Abd: soft, tender today with possible 4-5 cm area of [place] fluctuance in the  pannus without erythema obese, bowel sounds quiet. Ext: 2+ lower extremity edema with multiple violaceous, indurated, painful nodules on bilateral shins/ calves with  R > L ACCESS: New aVF LUA, PC R IJ  Imaging: Dg Chest 2 View  Result Date: 04/12/2017 CLINICAL DATA:  Elevated white blood cell count, mental status change. History of diabetes, end-stage renal disease on dialysis. Current smoker. EXAM: CHEST  2 VIEW COMPARISON:  Chest x-ray of April 03, 2017 FINDINGS: The lungs are adequately inflated. There is no focal infiltrate. The cardiac silhouette is enlarged. The pulmonary vascularity is normal. The dialysis catheter tip projects over the distal third of the SVC. IMPRESSION: Stable cardiomegaly without pulmonary vascular congestion or pulmonary edema. No acute pneumonia. Electronically Signed   By: David  [place]and M.D.   On: 04/12/2017 13:21   Ct Head Wo Contrast  Result Date: 04/11/2017 CLINICAL DATA:  51 y/o  F; altered mental status. EXAM: CT HEAD WITHOUT CONTRAST TECHNIQUE: Contiguous axial images were obtained from the base of the skull through the vertex without intravenous contrast. COMPARISON:  None. FINDINGS: Brain: No evidence of acute infarction, hemorrhage, hydrocephalus, extra-axial collection or mass lesion/mass effect.  Vascular: Calcific atherosclerosis of carotid siphons. No hyperdense vessel. Skull: Normal. Negative for fracture or focal lesion. Sinuses/Orbits: No acute finding. Other: None. IMPRESSION: 1. No acute intracranial abnormality. 2. Advanced calcific atherosclerosis of carotid siphons. 3. Otherwise unremarkable CT of the head. Electronically Signed   By: Mitzi HansenLance  Furusawa-Stratton M.D.   On: 04/11/2017 18:34   Dg Abd 2 Views  Result Date: 04/18/2017 CLINICAL DATA:  Pain all over.  Leukocytosis . EXAM: ABDOMEN - 2 VIEW COMPARISON:  Ultrasound Apr 01, 2017 FINDINGS: Limited exam due to patient positioning and movement. Right side and lower pelvis not imaged. Soft tissue  structures are unremarkable. Contrast in the colon. Radiopacity noted over the right abdomen may represent a pill fragment. No bowel distention. No free air. IMPRESSION: Limited exam.  No acute abnormality identified. Electronically Signed   ByMaisie Fus: Thomas  Register   On: 04/20/2017 09:36    Labs: BMET  Recent Labs Lab 04/08/17 0120 04/09/17 [id number] 04/10/17 [id number] 04/11/17 [id number] 04/12/17 0406 04/18/2017 0200 04/24/2017 0639  NA 136 135 136 136 136 141  --   K 4.9 4.6 4.8 5.0 5.5* 6.1* 6.4*  CL 97* 95* 96* 96* 94* 89*  --   CO2 24 25 22  21* 21* 17*  --   GLUCOSE 95 114* 88 100* 74 96  --   BUN 48* 34* 56* 41* 67* 45*  --   CREATININE 10.51* 7.73* 10.01* 7.54* 9.03* 6.21*  --   CALCIUM 9.0 9.5 9.1 9.9 10.4* 11.1*  --   PHOS 7.8* 7.6* 9.2*  --   --   --   --    CBC  Recent Labs Lab 04/09/17 0209 04/10/17 0808 04/11/17 0833 04/12/17 0406 04/25/2017 0200  WBC 16.8* 14.2* 16.4* 20.4* 30.0*  NEUTROABS 13.1*  --  12.1* 15.7* 23.7*  HGB 8.0* 7.3* 8.3* 8.6* 8.7*  HCT 25.7* 24.3* 26.9* 27.1* 27.5*  MCV 94.5 93.8 93.4 92.5 91.4  PLT 278 278 318 386 325    Medications:    . amLODipine  10 mg Oral QHS  . bacitracin   Topical Once  . darbepoetin (ARANESP) injection - DIALYSIS  200 mcg Intravenous Q Thu-HD  . gabapentin  300 mg Oral Daily  . heparin subcutaneous  5,000 Units Subcutaneous Q8H  . insulin aspart  0-15 Units Subcutaneous TID WC  . insulin detemir  32 Units Subcutaneous BID  . labetalol  200 mg Oral BID  . lanthanum  1,000 mg Oral TID WC  . lidocaine-EPINEPHrine  10 mL Intradermal Once  . multivitamin  1 tablet Oral QHS  . simvastatin  20 mg Oral Daily  . sucroferric oxyhydroxide  1,000 mg Oral TID WC  . vancomycin  125 mg Oral QID      Bufford ButtnerElizabeth Oisin Yoakum MD [facility] pgr [phone] 04/21/2017, 11:42 AM

## 2017-04-13 NOTE — Progress Notes (Signed)
PROGRESS NOTE  Teresa Dalton MWU:[id number] DOB: 1966/01/05 DOA: 03/31/2017 PCP: Patient, No Pcp Per  HPI/Recap of past 4 hours: 51 year old female with history of hypertension, diabetes, hyperlipidemia, CKD, presents with bilateral foot pain associated with lower extremity edema.  Patient was also noted to be C. difficile positive on admission as patient was having diarrhea.  Patient is currently being managed for end-stage renal disease requiring dialysis.  Today, while receiving dialysis early this a.m, patient noted to be agitated, diaphoretic, with an episode of hypotension.  Patient was also complaining of generalized abdominal tenderness which was not present the day prior.  Patient continues to complain of bilateral leg pain worse on the left.  Patient was subsequently transferred up to the unit without completing dialysis in stable condition.  On examination patient appears uncomfortable, generalized abdominal tenderness was noted, patient has not been having any diarrhea for the past few days as confirmed with the nurses.  Patient remained afebrile, with persistent rising WBC.  Chest x-ray, abdominal x-ray, UA are all unremarkable.  Patient denied any other symptoms, denied chest pain, worsening shortness of breath, cough, nausea/vomiting, diarrhea.  Of note patient has been intermittently lethargic for the past few days.    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 difficile diarrhea  #Sepsis Unclear etiology, due to generalized abdominal pain, suspect ??intra-abdominal infection Afebrile, severe leukocytosis, 30,000 Elevated lactic acid, will trend, Pro-calcitonin high Repeat  Blood culture x2 pending Repeat Urine culture pending Abdominal & chest x-ray unremarkable CT head negative CT abdomen/pelvis without contrast pending Start IV Vanco + IV Zosyn, both renally dosed Due to ESRD, with pt net +, cautious hydration with 100cc/hr with very close monitoring of BP (discussed with nephrology) VSS, currently stable, VS every 2H, if hypotensive, will start IV bolus ID consulted Monitor closely  #C. difficile diarrhea Patient no longer having diarrhea for the past 24-48 hours Afebrile, leukocytosis trending upwards Continue oral vancomycin ID on consult  #New onset end-stage renal disease Continue hemodialysis Nephrology on board  #Hyperkalemia Potassium 6.4, had a brief session of dialysis this a.m. Nephrology gave further treatments Repeat BMP at 6 PM  #Bilateral leg pain Likely from calciphylaxis Bilateral Doppler negative for DVT X-ray with subcutaneous edema Skin punch biopsy on 10/31 by general surgery pending Vascular surgery recommending arteriogram, will postpone due to patient's condition Continue treatment for calciphylaxis during dialysis  #Anemia of chronic kidney disease Continue IV iron and ESA during dialysis  #Type 2 diabetes Continue current insulin regimen  #Hyperlipidemia Continue statin     Code Status: Full  Family Communication: None at bedside  Disposition Plan: Home once stable   Consultants:  Nephrologist  Vascular surgeon  General surgeon  Procedures:  AV fistula on 10/25  Antimicrobials:  IV Zosyn  IV vancomycin  P.o. vancomycin  DVT prophylaxis: Heparin   Objective: Vitals:   05/13/17 0745 2017/05/13 1047 05/13/2017 1053 05-13-2017 1100  BP: 123/62 118/70 (!) 104/56 103/68  Pulse: 74 75 73 77  Resp: 20 19    Temp: 97.9 F (36.6 C) 98 F (36.7 C)    TempSrc: Oral     SpO2: 98%  Weight:  109.7 kg (241 lb 13.5 oz)    Height:        Intake/Output Summary (Last 24 hours) at 05/08/2017  1311 Last data filed at 04/12/2017 0900  Gross per 24 hour  Intake               60 ml  Output                0 ml  Net               60 ml   Filed Weights   04/12/17 1125 04/12/17 2207 05/06/2017 1047  Weight: 107.3 kg (236 lb 8.9 oz) 107.2 kg (236 lb 5.3 oz) 109.7 kg (241 lb 13.5 oz)    Exam:   General: Awake, agitated, lethargic/mild distress  Cardiovascular: S1-S2 present, no added heart sounds  Respiratory: Chest clear bilaterally  Abdomen: Soft, diffusely tender, nondistended, minimal bowel sounds  Musculoskeletal: No bilateral pedal edema, superficial wound on the lower left extremity  Skin: Normal  Psychiatry: Agitated   Data Reviewed: CBC:  Recent Labs Lab 04/09/17 0209 04/10/17 0808 04/11/17 0833 04/12/17 0406 04/23/2017 0200  WBC 16.8* 14.2* 16.4* 20.4* 30.0*  NEUTROABS 13.1*  --  12.1* 15.7* 23.7*  HGB 8.0* 7.3* 8.3* 8.6* 8.7*  HCT 25.7* 24.3* 26.9* 27.1* 27.5*  MCV 94.5 93.8 93.4 92.5 91.4  PLT 278 278 318 386 325   Basic Metabolic Panel:  Recent Labs Lab 04/08/17 0120 04/09/17 0209 04/10/17 0808 04/11/17 0833 04/12/17 0406 05/03/2017 0200 05/05/2017 0639  NA 136 135 136 136 136 141  --   K 4.9 4.6 4.8 5.0 5.5* 6.1* 6.4*  CL 97* 95* 96* 96* 94* 89*  --   CO2 24 25 22  21* 21* 17*  --   GLUCOSE 95 114* 88 100* 74 96  --   BUN 48* 34* 56* 41* 67* 45*  --   CREATININE 10.51* 7.73* 10.01* 7.54* 9.03* 6.21*  --   CALCIUM 9.0 9.5 9.1 9.9 10.4* 11.1*  --   PHOS 7.8* 7.6* 9.2*  --   --   --   --    GFR: Estimated Creatinine Clearance: 13.5 mL/min (A) (by C-G formula based on SCr of 6.21 mg/dL (H)). Liver Function Tests:  Recent Labs Lab 04/08/17 0120 04/09/17 0209 04/10/17 0808  ALBUMIN 2.5* 2.8* 2.6*   No results for input(s): LIPASE, AMYLASE in the last 168 hours. No results for input(s): AMMONIA in the last 168 hours. Coagulation Profile: No results for input(s): INR, PROTIME in the last 168 hours. Cardiac Enzymes:  Recent Labs Lab  04/09/17 1412  CKTOTAL 515*   BNP (last 3 results) No results for input(s): PROBNP in the last 8760 hours. HbA1C: No results for input(s): HGBA1C in the last 72 hours. CBG:  Recent Labs Lab 04/12/17 1145 04/12/17 1738 04/12/17 2206 04/18/2017 0741 04/18/2017 1118  GLUCAP 144* 98 99 142* 104*   Lipid Profile: No results for input(s): CHOL, HDL, LDLCALC, TRIG, CHOLHDL, LDLDIRECT in the last 72 hours. Thyroid Function Tests: No results for input(s): TSH, T4TOTAL, FREET4, T3FREE, THYROIDAB in the last 72 hours. Anemia Panel: No results for input(s): VITAMINB12, FOLATE, FERRITIN, TIBC, IRON, RETICCTPCT in the last 72 hours. Urine analysis:    Component Value Date/Time   COLORURINE YELLOW 04/10/2017 0540   APPEARANCEUR HAZY (A) 04/10/2017 0540   LABSPEC 1.000 (L) 04/10/2017 0540   PHURINE 7.0 04/10/2017 0540   GLUCOSEU NEGATIVE 04/10/2017 0540   HGBUR MODERATE (A) 04/10/2017  0540   BILIRUBINUR NEGATIVE 04/10/2017 0540   KETONESUR NEGATIVE 04/10/2017 0540   PROTEINUR 30 (A) 04/10/2017 0540   UROBILINOGEN 0.2 05/30/2010 1628   NITRITE NEGATIVE 04/10/2017 0540   LEUKOCYTESUR NEGATIVE 04/10/2017 0540   Sepsis Labs: @LABRCNTIP (procalcitonin:4,lacticidven:4)  ) Recent Results (from the past 240 hour(s))  Culture, blood (Routine X 2) w Reflex to ID Panel     Status: None   Collection Time: 04/03/17  7:01 PM  Result Value Ref Range Status   Specimen Description BLOOD LEFT WRIST  Final   Special Requests   Final    BOTTLES DRAWN AEROBIC AND ANAEROBIC Blood Culture adequate volume   Culture NO GROWTH 5 DAYS  Final   Report Status 04/08/2017 FINAL  Final  Culture, blood (Routine X 2) w Reflex to ID Panel     Status: None   Collection Time: 04/03/17  7:01 PM  Result Value Ref Range Status   Specimen Description BLOOD RIGHT ANTECUBITAL  Final   Special Requests   Final    BOTTLES DRAWN AEROBIC AND ANAEROBIC Blood Culture results may not be optimal due to an excessive volume of  blood received in culture bottles   Culture NO GROWTH 5 DAYS  Final   Report Status 04/08/2017 FINAL  Final  Surgical pcr screen     Status: None   Collection Time: 03/27/2017  7:43 AM  Result Value Ref Range Status   MRSA, PCR NEGATIVE NEGATIVE Final   Staphylococcus aureus NEGATIVE NEGATIVE Final    Comment: (NOTE) The Xpert SA Assay (FDA approved for NASAL specimens in patients 34 years of age and older), is one component of a comprehensive surveillance program. It is not intended to diagnose infection nor to guide or monitor treatment.   C difficile quick scan w PCR reflex     Status: Abnormal   Collection Time: 04/09/17 11:52 AM  Result Value Ref Range Status   C Diff antigen POSITIVE (A) NEGATIVE Final   C Diff toxin NEGATIVE NEGATIVE Final   C Diff interpretation Results are indeterminate. See PCR results.  Final  Clostridium Difficile by PCR     Status: Abnormal   Collection Time: 04/09/17 11:52 AM  Result Value Ref Range Status   Toxigenic C Difficile by pcr POSITIVE (A) NEGATIVE Final    Comment: Positive for toxigenic C. difficile with little to no toxin production. Only treat if clinical presentation suggests symptomatic illness.      Studies: Dg Chest 2 View  Result Date: 04/12/2017 CLINICAL DATA:  Elevated white blood cell count, mental status change. History of diabetes, end-stage renal disease on dialysis. Current smoker. EXAM: CHEST  2 VIEW COMPARISON:  Chest x-ray of April 03, 2017 FINDINGS: The lungs are adequately inflated. There is no focal infiltrate. The cardiac silhouette is enlarged. The pulmonary vascularity is normal. The dialysis catheter tip projects over the distal third of the SVC. IMPRESSION: Stable cardiomegaly without pulmonary vascular congestion or pulmonary edema. No acute pneumonia. Electronically Signed   By: David  [place]and M.D.   On: 04/12/2017 13:21   Dg Abd 2 Views  Result Date: 05-05-17 CLINICAL DATA:  Pain all over.  Leukocytosis . EXAM:  ABDOMEN - 2 VIEW COMPARISON:  Ultrasound 04/03/2017 FINDINGS: Limited exam due to patient positioning and movement. Right side and lower pelvis not imaged. Soft tissue structures are unremarkable. Contrast in the colon. Radiopacity noted over the right abdomen may represent a pill fragment. No bowel distention. No free air. IMPRESSION: Limited exam.  No  acute abnormality identified. Electronically Signed   By: Maisie Fushomas  Register   On: 05/04/2017 09:36    Scheduled Meds: . amLODipine  10 mg Oral QHS  . bacitracin   Topical Once  . darbepoetin (ARANESP) injection - DIALYSIS  200 mcg Intravenous Q Thu-HD  . gabapentin  300 mg Oral Daily  . heparin subcutaneous  5,000 Units Subcutaneous Q8H  . insulin aspart  0-15 Units Subcutaneous TID WC  . insulin detemir  32 Units Subcutaneous BID  . labetalol  200 mg Oral BID  . lanthanum  1,000 mg Oral TID WC  . lidocaine-EPINEPHrine  10 mL Intradermal Once  . multivitamin  1 tablet Oral QHS  . patiromer  8.4 g Oral Daily  . simvastatin  20 mg Oral Daily  . sucroferric oxyhydroxide  1,000 mg Oral TID WC  . vancomycin  125 mg Oral QID    Continuous Infusions: . sodium chloride 10 mL/hr at 03/18/2017 0844  . sodium chloride 100 mL/hr at 05/02/2017 1238  . ferric gluconate (FERRLECIT/NULECIT) IV 125 mg (04/10/17 1246)  . piperacillin-tazobactam (ZOSYN)  IV    . sodium thiosulfate infusion for calciphylaxis Stopped (04/12/17 1455)  . vancomycin       LOS: 14 days     Briant CedarNkeiruka J Krishang Reading, MD [facility]  If 7PM-7AM, please contact night-coverage [email or web address] Password [email or web address] 04/27/2017, 1:11 PM

## 2017-04-13 NOTE — Progress Notes (Signed)
Pt. Currently having HD with surgical procedure also scheduled for today. Will hold OT for today and follow up for skilled occupational therapy as pt. Able.   Tommy MedalJenny Shadonna Benedick, COTA/L

## 2017-04-13 NOTE — H&P (View-Only) (Signed)
Patient ID: Teresa Kayseriahann Sayer V., female   DOB: 1965/12/08, 51 y.o.   MRN: [id number] Scheduled for arteriogram tomorrow to define anatomy to her lower extremities.  Treatment of severe calciphylaxis per primary team

## 2017-04-13 NOTE — Progress Notes (Signed)
Dr. Signe ColtUpton aware, patient drank 15mL out of 30mL of Veltassa to lower potassium. Patient refused the rest of the medication. Will continue to monitor

## 2017-04-13 NOTE — Interval H&P Note (Signed)
History and Physical Interval Note:  05/03/2017 10:45 AM  Teresa President V.  has presented today for surgery, with the diagnosis of pvd - ulcers  The various methods of treatment have been discussed with the patient and family. After consideration of risks, benefits and other options for treatment, the patient has consented to  Procedure(s): ABDOMINAL AORTOGRAM W/LOWER EXTREMITY (N/A) as a surgical intervention .  The patient's history has been reviewed, patient examined, no change in status, stable for surgery.  I have reviewed the patient's chart and labs.  Questions were answered to the patient's satisfaction.     Fabienne BrunsFields, Chonita Gadea

## 2017-04-13 NOTE — Progress Notes (Signed)
Initial Nutrition Assessment  DOCUMENTATION CODES:   Morbid obesity  INTERVENTION:   -Nepro Shake po BID, each supplement provides 425 kcal and 19 grams protein  -Pro-Stat 30 mL BID (100 kcals, 15 g of protien per packet)   NUTRITION DIAGNOSIS:   Inadequate oral intake related to poor appetite as evidenced by per patient/family report, meal completion < 50%.  GOAL:   Patient will meet greater than or equal to 90% of their needs  MONITOR:   PO intake, Supplement acceptance, Labs, Weight trends  REASON FOR ASSESSMENT:   Consult Poor PO  ASSESSMENT:   51 yo female admitted with bilateral foot pain associated with LE edema with calciphylaxis, new onset ESRD with initiation of HD. Pt also C.diff postive . Pt with hx of HTN, DM, HLD, CKD  10/20 HD cath placed, HD initiated 10/25 AV fistula placed 10/31suspected calciphylaxis, punch biopsy of leg wound performed 11/2 arteriogram today  Pt with poor appetite; recorded po intake 0-100% of meals. NPO today.   Labs: potassium 6.4, calcium 11.1 (H-corrected calcium 12.2 with albumin 2.6), phosphorus 9.2 on 10/30 (H) Meds: fosrenol, Rena-Vit, sodium thiosulfate, velphoro  NUTRITION - FOCUSED PHYSICAL EXAM:  Unable to complete Nutrition-Focused physical exam at this time.   Diet Order:  Diet NPO time specified Except for: Sips with Meds  EDUCATION NEEDS:   Education needs have been addressed  Skin:  Skin Assessment: Reviewed RN Assessment  Last BM:  10/30  Height:   Ht Readings from Last 1 Encounters:  03/30/2017 5\' 5"  (1.651 m)    Weight:   Wt Readings from Last 1 Encounters:  04/12/2017 241 lb 13.5 oz (109.7 kg)    Ideal Body Weight:  56.8 kg  BMI:  Body mass index is 40.25 kg/m.  Estimated Nutritional Needs:   Kcal:  2000-2280 kcals  Protein:  110-120 g  Fluid:  1000 mL plus UOP  CSX CorporationCate Blaine Hari MS, RD, LDN, CNSC [phone] Pager  [phone] Weekend/On-Call Pager

## 2017-04-13 NOTE — Progress Notes (Signed)
Results for Roma KayserHAMILTON, Yailin V. (MRN [id number]) as of 04/24/2017 08:19  Ref. Range 05/01/2017 06:39  Potassium Latest Ref Range: 3.5 - 5.1 mmol/L 6.4 (HH)    Dr. Signe ColtUpton notified, ordering short dialysis treatment before procedure.

## 2017-04-13 NOTE — Progress Notes (Signed)
Spoke with Donta(son), and Devon(son) about consent for procedure. Both sons agreed verbally to consent/permission.

## 2017-04-13 NOTE — Progress Notes (Signed)
Pharmacy Antibiotic Note  Teresa Liana GeroldHamilton V. is a 51 y.o. female admitted on 03/28/2017 with sepsis.  Pharmacy has been consulted for vancomycin and Zosyn dosing.  Patient was supposed to go for short HD session today prior to arteriogram. She only tolerated 30 minutes and then became very combative and had to come off HD. She is currently receiving treatment for CDiff with PO vancomycin, but due to worsening leukocytosis, increasing abdominal pain, elevated PCT and LA- broad spectrum antibiotics being started.  Plan: Vancomycin 2500mg  IV x1- will not enter maintenance doses as this time since she is new to HD (noted she was CLIP'd to a TTSat schedule) and tenuous situation may change HD schedule Zosyn 3.375g IV q12h EI PO Vancomycin 125mg  PO 4 times daily Follow c/s, clinical progression, HD schedule/tolernace, level PRN   Height: 5\' 5"  (165.1 cm) Weight: 241 lb 13.5 oz (109.7 kg) IBW/kg (Calculated) : 57  Temp (24hrs), Avg:98.2 F (36.8 C), Min:97.9 F (36.6 C), Max:98.6 F (37 C)   Recent Labs Lab 04/09/17 0209 04/10/17 0808 04/11/17 0833 04/12/17 0406 04/29/2017 0200 04/28/2017 0908  WBC 16.8* 14.2* 16.4* 20.4* 30.0*  --   CREATININE 7.73* 10.01* 7.54* 9.03* 6.21*  --   LATICACIDVEN  --   --   --   --   --  5.6*    Estimated Creatinine Clearance: 13.5 mL/min (A) (by C-G formula based on SCr of 6.21 mg/dL (H)).    No Known Allergies  Antimicrobials this admission: PO vanc 10/30>> IV Vanc 11/2>> Zosyn 11/2>>  Dose adjustments this admission: n/a  Microbiology results: 10/23 BCx: neg 10/29 CDiff: antigen pos, tox neg 10/29 CDiff PCR: pos  Thank you for allowing pharmacy to be a part of this patient's care.  Shiah Berhow D. Katreena Schupp, PharmD, BCPS Clinical Pharmacist Pager: [phone] Clinical Phone for 04/16/2017 until 3:30pm: [phone] If after 3:30pm, please call main pharmacy at [phone] 04/26/2017 12:17 PM

## 2017-04-13 NOTE — Progress Notes (Signed)
Patient's son Erin Fulling(Donta [phone]) was contacted in order to receive verbal consent over the phone for patient's procedure. The son stated that he did not want to give verbal consent over the phone until contacted by the MD and procedure is explained. MD paged. Waiting for call back.

## 2017-04-13 NOTE — Progress Notes (Signed)
Spoke with Dr. Randie Heinzain about consent form for patient. Dr. stated that he left message for Dr. Darrick PennaFields, who is in the OR today. Waiting for response.

## 2017-04-13 NOTE — Progress Notes (Addendum)
Report from dialysis, treatment lasted 35 mintues, - of fluid. Patient was agitated, confused, c/o abdominal pain and diaphoretic. Blood sugar 104. MD notified of situation. MD ordered to take vitals every 2 hours.Patient is back in room, vital: BP 125/93, HR 75, Temp 97.9 axillary. Patient was adjusted and made comfortable in bed. Agitation has decreased. Bed alarm on, in low position, call light within reach. Will continue to monitor.

## 2017-04-13 NOTE — Progress Notes (Signed)
Dialysis treatment terminated after 30 minutes per neprhology d/t extreme confusion per patient.  234 mL ultrafiltrated.  -666 mL net fluid removal.  Patient confused, combative. Lung sounds diminished to ausculation in all fields. Generalized edema. Cardiac: NSR.  Cleansed RIJ catheter with chlorhexidine.  Disconnected lines and flushed ports with saline per protocol.  Ports locked with heparin and capped per protocol.    Report given to bedside, RN Brittney.   Patient became extremely confused and combative at start of treatment.  Nephrology at bedside.  NS bolus' given for suspected hypotensive reaction.  Decision made to d/c HD treatment.

## 2017-04-13 NOTE — Progress Notes (Addendum)
Placed patient on 1L [place] due to O2 92%.  Dr. Signe ColtUpton paged due to patient refusing to take Veltassa for high potassium. Will continue to monitor

## 2017-04-13 NOTE — Consult Note (Signed)
[facility]    Date of Admission:  03/24/2017     Total days of antibiotics Oral Vancomycin 10/29 >> Vancomycin 11/2 >>  Zosyn 11/2 >>        Reason for Consult: C. Difficile    Referring Provider: Sharolyn Douglas Primary Care Provider: Patient, No Pcp Per   Assessment: 51 year old female with previous medical history of type 2 diabetes, thyroid disease, hypertension, and end-stage renal disease with new onset C. Difficile positive PCR diarrhea and negative toxin with no recent antibiotic usage.  Currently taking vancomycin orally. Now with concern for sepsis with unknown source of infection if present.  Blood cultures, urine culture, CT scan of the abdomen are pending.  Empirically treating with broad-spectrum vancomycin and Zosyn. Vital signs are stable. Continues to experience lower extremity pain likely associated with calciphylaxis.   Plan:  C. Diff positive diarrhea - No recent diarrhea per nursing staff. Cannot rule out post-infectious IBS given negative toxin. Recommend continuation of oral vancomycin for a total of 10 days and then discontinuing on 11/8. Continue enteric precautions.   Sepsis - Continue with broad spectrum vancomycin and zosyn with pending blood and urine cultures. If CT scan of the abdomen is negative would recommend discontinuing vancomycin and zoysn. Narrow antibiotics as able.   Bilateral leg pain - Continues to experience bilateral leg pain likely from calciphylaxis. Continue care per primary and vascular surgery.   ESRD - Per nephrology.   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 difficile diarrhea   Scheduled Meds: . amLODipine  10 mg Oral QHS    . bacitracin   Topical Once  . darbepoetin (ARANESP) injection - DIALYSIS  200 mcg Intravenous Q Thu-HD  . gabapentin  300 mg Oral Daily  . heparin subcutaneous  5,000 Units Subcutaneous Q8H  . insulin aspart  0-15 Units Subcutaneous TID WC  . insulin detemir  32 Units Subcutaneous BID  . labetalol  200 mg Oral BID  . lanthanum  1,000 mg Oral TID WC  . lidocaine-EPINEPHrine  10 mL Intradermal Once  . multivitamin  1 tablet Oral QHS  . patiromer  8.4 g Oral Daily  . simvastatin  20 mg Oral Daily  . sucroferric oxyhydroxide  1,000 mg Oral TID WC  . vancomycin  125 mg Oral QID   Continuous Infusions: . sodium chloride 10 mL/hr at 03/22/2017 0844  . sodium chloride 100 mL/hr at 04/14/2017 1238  . ferric gluconate (FERRLECIT/NULECIT) IV 125 mg (04/10/17 1246)  . piperacillin-tazobactam (ZOSYN)  IV 3.375 g (04/16/2017 1404)  . sodium thiosulfate infusion for calciphylaxis Stopped (04/12/17 1455)  . vancomycin 2,500 mg (05/06/2017 1403)   PRN Meds:.acetaminophen **OR** acetaminophen, HYDROcodone-acetaminophen, lidocaine (PF), lidocaine, methocarbamol, MUSCLE RUB, ondansetron **OR** ondansetron (ZOFRAN) IV, senna-docusate  HPI: Teresa Dalton. is a 51 y.o. female with a previous medical history of type 2 diabetes, thyroid disease, hypertension, hyperlipidemia, and end-stage renal disease presenting to the hospital with increasing leg pain and during hospitalization noted to have increasing diarrhea with positive C. difficile testing and started on p.o. vancomycin.  Hospital Course: Admitted to the hospital with the chief complaint of chronic  bilateral leg swelling and pain consistently worsening over the course of 1 week prior to admission.  Noted to have associated weight gain, abdominal swelling, and shortness of breath with exertion.  Physical exam with 3+ pitting edema and diffuse tenderness to palpation.  White blood cell count was 12.2 and potassium was 6.1.  Urinalysis showed trace  leukocytes with no nitrites or hematuria.  The chest showed haziness over the bases with cardiomegaly and no overt edema.  Renal ultrasound with increased parenchymal echotexture suggesting medical renal disease.  Diagnosed with progressive chronic kidney disease stage V likely related to NSAIDs, hypertension, and type 2 diabetes.  On 10/28 she was noted to have increasing leukocytosis with blood cultures ordered.  On 1029 new-onset diarrhea with concern for C. difficile.  Her C. difficile antigen was positive with C. difficile toxin negative and PCR positive.  Blood cultures were negative times 5 days.  She was started on oral vancomycin.  Now with increasing leukocytosiswith WBC of 30.  Nursing staff notes she has not had any recent episodes of diarrhea. She is on enteric precautions.  During her dialysis treatment she was noted to have increased agitation, diaphoresis with 1 episode of hypotension. She has not had any fevers. Also started experiencing new onset abdominal pain with diffuse abdominal tenderness noted on exam. When patient was interviewed she was not complaining of any abdominal pains but she is confused as she was only able to tell me her name and that [place]. Unsure of any bowel movements or fevers. Unable to tell of any other recent antibiotics. She is currently being treated for sepsis and started on Zosyn in addition to vancomycin with repeat blood cultures, urine culture and CT of the abdomen and pelvis to rule out intra-abdominal infection.   Review of Systems  Unable to perform ROS: Acuity of condition    Past Medical History:  Diagnosis Date  . Anemia of chronic disease    /notes 04/02/2017  . CKD (chronic kidney disease), stage IV (HCC)    rin 2015/notes 04/02/2017; enal insufficiency (1.6-1.9 creatinine)  . ESRD (end stage renal disease) on dialysis [facility](HCC)    "new onset; just started dialysis 2 days ago" (04/03/2017)  . Hyperlipidemia   . Hypertension   .  Thyroid disease    ? past problem  . Type II diabetes mellitus (HCC)     Social History  Substance Use Topics  . Smoking status: Current Every Day Smoker    Packs/day: 0.25    Years: 30.00    Types: Cigarettes  . Smokeless tobacco: Never Used  . Alcohol use No    Family History  Problem Relation Age of Onset  . Diabetes Mother   . Heart disease Mother   . Hyperlipidemia Mother   . Cancer Father        lung, non-smoker. deceased 1142yr  . Diabetes Sister   . Heart disease Brother        MI @ 7344   No Known Allergies  OBJECTIVE: Blood pressure (!) 145/61, pulse 88, temperature 98.2 F (36.8 C), temperature source Oral, resp. rate (!) 24, height 5\' 5"  (1.651 m), weight 241 lb 13.5 oz (109.7 kg), last menstrual period 03/26/2017, SpO2 92 %.  Physical Exam  Constitutional: She is well-developed, well-nourished, and in no distress. No distress.  Cardiovascular: Normal rate, normal heart sounds and intact distal pulses.  Exam reveals no gallop and no friction rub.   No murmur heard. Pulmonary/Chest: Effort normal and  breath sounds normal. No respiratory distress. She has no wheezes. She has no rales. She exhibits no tenderness.  Abdominal: Soft. Bowel sounds are normal. She exhibits no distension and no mass. There is no tenderness. There is no rebound and no guarding.  Neurological: She is alert. She is disoriented.  Skin: Skin is warm and dry. No rash noted.    Lab Results Lab Results  Component Value Date   WBC 30.0 (H) 04/20/2017   HGB 8.7 (L) 05/02/2017   HCT 27.5 (L) 04/23/2017   MCV 91.4 04/29/2017   PLT 325 04/26/2017    Lab Results  Component Value Date   CREATININE 6.21 (H) 04/29/2017   BUN 45 (H) 05/08/2017   NA 141 05/03/2017   K 6.4 (HH) 05/07/2017   CL 89 (L) 04/25/2017   CO2 17 (L) 04/20/2017    Lab Results  Component Value Date   ALT 11 (L) 04/10/2017   AST 11 (L) 03/25/2017   ALKPHOS 118 03/25/2017   BILITOT 0.8 04/03/2017      Microbiology: Recent Results (from the past 240 hour(s))  Culture, blood (Routine X 2) w Reflex to ID Panel     Status: None   Collection Time: 04/03/17  7:01 PM  Result Value Ref Range Status   Specimen Description BLOOD LEFT WRIST  Final   Special Requests   Final    BOTTLES DRAWN AEROBIC AND ANAEROBIC Blood Culture adequate volume   Culture NO GROWTH 5 DAYS  Final   Report Status 04/08/2017 FINAL  Final  Culture, blood (Routine X 2) w Reflex to ID Panel     Status: None   Collection Time: 04/03/17  7:01 PM  Result Value Ref Range Status   Specimen Description BLOOD RIGHT ANTECUBITAL  Final   Special Requests   Final    BOTTLES DRAWN AEROBIC AND ANAEROBIC Blood Culture results may not be optimal due to an excessive volume of blood received in culture bottles   Culture NO GROWTH 5 DAYS  Final   Report Status 04/08/2017 FINAL  Final  Surgical pcr screen     Status: None   Collection Time: 04/10/2017  7:43 AM  Result Value Ref Range Status   MRSA, PCR NEGATIVE NEGATIVE Final   Staphylococcus aureus NEGATIVE NEGATIVE Final    Comment: (NOTE) The Xpert SA Assay (FDA approved for NASAL specimens in patients 33 years of age and older), is one component of a comprehensive surveillance program. It is not intended to diagnose infection nor to guide or monitor treatment.   C difficile quick scan w PCR reflex     Status: Abnormal   Collection Time: 04/09/17 11:52 AM  Result Value Ref Range Status   C Diff antigen POSITIVE (A) NEGATIVE Final   C Diff toxin NEGATIVE NEGATIVE Final   C Diff interpretation Results are indeterminate. See PCR results.  Final  Clostridium Difficile by PCR     Status: Abnormal   Collection Time: 04/09/17 11:52 AM  Result Value Ref Range Status   Toxigenic C Difficile by pcr POSITIVE (A) NEGATIVE Final    Comment: Positive for toxigenic C. difficile with little to no toxin production. Only treat if clinical presentation suggests symptomatic illness.   Marcos Eke, NP [facility] [phone] Pager [phone] Cell   05/10/2017, 3:26 PM

## 2017-04-14 ENCOUNTER — Inpatient Hospital Stay (HOSPITAL_COMMUNITY): Payer: BLUE CROSS/BLUE SHIELD

## 2017-04-14 ENCOUNTER — Encounter (HOSPITAL_COMMUNITY): Payer: Self-pay | Admitting: *Deleted

## 2017-04-14 DIAGNOSIS — K869 Disease of pancreas, unspecified: Secondary | ICD-10-CM

## 2017-04-14 DIAGNOSIS — R1031 Right lower quadrant pain: Secondary | ICD-10-CM

## 2017-04-14 LAB — CBC WITH DIFFERENTIAL/PLATELET
BASOS PCT: 0 %
Basophils Absolute: 0 10*3/uL (ref 0.0–0.1)
EOS ABS: 0.3 10*3/uL (ref 0.0–0.7)
EOS PCT: 1 %
HEMATOCRIT: 26.6 % — AB (ref 36.0–46.0)
Hemoglobin: 8.5 g/dL — ABNORMAL LOW (ref 12.0–15.0)
LYMPHS PCT: 8 %
Lymphs Abs: 2.2 10*3/uL (ref 0.7–4.0)
MCH: 29 pg (ref 26.0–34.0)
MCHC: 32 g/dL (ref 30.0–36.0)
MCV: 90.8 fL (ref 78.0–100.0)
Monocytes Absolute: 2.4 10*3/uL — ABNORMAL HIGH (ref 0.1–1.0)
Monocytes Relative: 9 %
NEUTROS PCT: 82 %
Neutro Abs: 22.1 10*3/uL — ABNORMAL HIGH (ref 1.7–7.7)
PLATELETS: 329 10*3/uL (ref 150–400)
RBC: 2.93 MIL/uL — ABNORMAL LOW (ref 3.87–5.11)
RDW: 14.8 % (ref 11.5–15.5)
WBC: 27 10*3/uL — ABNORMAL HIGH (ref 4.0–10.5)

## 2017-04-14 LAB — BASIC METABOLIC PANEL
Anion gap: 31 — ABNORMAL HIGH (ref 5–15)
BUN: 85 mg/dL — AB (ref 6–20)
CHLORIDE: 89 mmol/L — AB (ref 101–111)
CO2: 18 mmol/L — ABNORMAL LOW (ref 22–32)
Calcium: 10.9 mg/dL — ABNORMAL HIGH (ref 8.9–10.3)
Creatinine, Ser: 7.84 mg/dL — ABNORMAL HIGH (ref 0.44–1.00)
GFR, EST AFRICAN AMERICAN: 6 mL/min — AB (ref >60)
GFR, EST NON AFRICAN AMERICAN: 5 mL/min — AB (ref >60)
Glucose, Bld: 98 mg/dL (ref 65–99)
POTASSIUM: 6.2 mmol/L — AB (ref 3.5–5.1)
Sodium: 138 mmol/L (ref 135–145)

## 2017-04-14 LAB — GLUCOSE, CAPILLARY
GLUCOSE-CAPILLARY: 115 mg/dL — AB (ref 65–99)
GLUCOSE-CAPILLARY: 120 mg/dL — AB (ref 65–99)
Glucose-Capillary: 256 mg/dL — ABNORMAL HIGH (ref 65–99)
Glucose-Capillary: 437 mg/dL — ABNORMAL HIGH (ref 65–99)
Glucose-Capillary: 207 mg/dL — ABNORMAL HIGH (ref 65–99)

## 2017-04-14 LAB — HCG, SERUM, QUALITATIVE: PREG SERUM: NEGATIVE

## 2017-04-14 MED ORDER — SODIUM CHLORIDE 0.9 % IV SOLN
125.0000 mg | INTRAVENOUS | Status: DC
  Administered 2017-04-14: 125 mg via INTRAVENOUS
  Filled 2017-04-14 (×3): qty 10

## 2017-04-14 MED ORDER — IOPAMIDOL (ISOVUE-300) INJECTION 61%
INTRAVENOUS | Status: AC
  Administered 2017-04-14: 100 mL
  Filled 2017-04-14: qty 100

## 2017-04-14 MED ORDER — SODIUM CHLORIDE 0.9 % IV BOLUS (SEPSIS)
500.0000 mL | Freq: Once | INTRAVENOUS | Status: AC
  Administered 2017-04-14: 500 mL via INTRAVENOUS

## 2017-04-14 MED ORDER — DEXTROSE 50 % IV SOLN
INTRAVENOUS | Status: AC
  Administered 2017-04-14: 21:00:00
  Administered 2017-04-14: 50 mL
  Filled 2017-04-14: qty 50

## 2017-04-14 MED ORDER — HYDROMORPHONE HCL 1 MG/ML IJ SOLN
1.0000 mg | Freq: Once | INTRAMUSCULAR | Status: AC | PRN
  Administered 2017-04-14: 0.5 mg via INTRAVENOUS
  Administered 2017-04-14: 0.25 mg via INTRAVENOUS
  Filled 2017-04-14: qty 1

## 2017-04-14 MED ORDER — SODIUM CHLORIDE 0.9 % IV BOLUS (SEPSIS): 250.0000 mL | Freq: Once | INTRAVENOUS | Status: DC | PRN

## 2017-04-14 NOTE — Progress Notes (Signed)
[facility]    Date of Admission:  03/19/2017   Total days of antibiotics 6        Day 2 oral vanco          ID: Teresa Golab V. is a 51 y.o. female with  ESRD c/b calciphylaxis having leukocytosis abdominal pain concern for cdifficile vs other infectious vs. 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 difficile diarrhea    Subjective: Having abdominal pain RLQ but no guarding but also in area of induration of pannus concerning for calciphylaxis. Not having diarrhea in the last 2 days  abd ct on 11/2 did not show colitis but did see focal area in pancreatic tail and GB sludge  Medications:  . amLODipine  10 mg Oral QHS  . bacitracin   Topical Once  . darbepoetin (ARANESP) injection - DIALYSIS  200 mcg Intravenous Q Thu-HD  . feeding supplement (NEPRO CARB STEADY)  237 mL Oral BID BM  . feeding supplement (PRO-STAT SUGAR FREE 64)  30 mL Oral BID  . gabapentin  300 mg Oral Daily  . heparin subcutaneous  5,000 Units Subcutaneous Q8H  . insulin aspart  0-15 Units Subcutaneous TID WC  . insulin detemir  32 Units Subcutaneous BID  . labetalol  200 mg Oral BID  . lanthanum  1,000 mg Oral TID WC  . lidocaine-EPINEPHrine  10 mL Intradermal Once  . multivitamin  1 tablet Oral QHS  . patiromer  8.4 g Oral Daily  . simvastatin  20 mg Oral Daily  . sucroferric oxyhydroxide  1,000 mg Oral TID WC  . vancomycin  125 mg Oral QID    Objective: Vital signs in last 24 hours: Temp:  [97.6 F (36.4 C)-98.5 F (36.9 C)] 98 F (36.7 C) (11/03 0949) Pulse Rate:  [63-98] 89 (11/03 0949) Resp:  [19-40] 22 (11/03 0949) BP: (105-153)/(61-98)  134/66 (11/03 0949) SpO2:  [92 %-100 %] 100 % (11/03 0949) Weight:  [241 lb 6.5 oz (109.5 kg)-243 lb 2.7 oz (110.3 kg)] 241 lb 6.5 oz (109.5 kg) (11/02 2200) Physical Exam  Constitutional:  oriented to person only appears well-developed and in mild distress due to pain.  HENT: [place]/AT, PERRLA, no scleral icterus Mouth/Throat: Oropharynx is clear and moist. No oropharyngeal exudate.  Cardiovascular: Normal rate, regular rhythm and normal heart sounds. Exam reveals no gallop and no friction rub.  No murmur heard.  Pulmonary/Chest: Effort normal and breath sounds normal. No respiratory distress.  has no wheezes.  Abdominal: Soft. Bowel sounds are slow.  exhibits no distension. TTP in RLQ and in area on pannus  Skin: Skin is warm and dry. LE have areas of black eschar and painful scattered nodules with dark/deep purple macules most noticeably R>L in anterior tibial region and calves Psychiatric: a normal mood and affect.  behavior is normal.    Lab Results  Recent Labs  04/29/2017 0200  04/22/2017 1831 04/14/17 0459  WBC 30.0*  --   --  27.0*  HGB 8.7*  --   --  8.5*  HCT 27.5*  --   --  26.6*  NA  141  --  138 138  K 6.1*  < > 5.2* 6.2*  CL 89*  --  92* 89*  CO2 17*  --  18* 18*  BUN 45*  --  70* 85*  CREATININE 6.21*  --  6.96* 7.84*  < > = values in this interval not displayed.  Microbiology: 11/2 blood cx ngtd 10/23 blood cx ngtd Studies/Results: Ct Abdomen Pelvis Wo Contrast  Result Date: 05/05/17 CLINICAL DATA:  Patient with history of back pain. Evaluate for malignancy. EXAM: CT ABDOMEN AND PELVIS WITHOUT CONTRAST TECHNIQUE: Multidetector CT imaging of the abdomen and pelvis was performed following the standard protocol without IV contrast. COMPARISON:  Abdominal radiograph 05/05/17; chest radiograph 04/12/2017. FINDINGS: Lower chest: Heart is enlarged. Prominent subcentimeter inferior mediastinal lymph nodes. Hepatobiliary: Liver is diffusely low in attenuation compatible with  steatosis. High attenuation within the gallbladder lumen suggestive of sludge. No intrahepatic or extrahepatic biliary ductal dilatation. Pancreas: No inflammatory changes. Within the region the pancreatic tail there is a 3.2 cm area which has a slightly different morphology within the remainder the pancreatic head and body. Spleen: Unremarkable Adrenals/Urinary Tract: Normal adrenal glands. The left kidney is atrophic. Right kidney is normal in contour. No hydronephrosis. Urinary bladder is unremarkable. Stomach/Bowel: Descending and sigmoid colonic diverticulosis. No CT evidence for acute diverticulitis. Normal appendix. Normal morphology of the stomach. No free fluid or free intraperitoneal air. Vascular/Lymphatic: Normal caliber abdominal aorta. Peripheral calcified atherosclerotic plaque. No retroperitoneal lymphadenopathy. Reproductive: Uterus and adnexal structures are unremarkable. Other: Soft tissue anasarca. Musculoskeletal: Lumbar spine degenerative changes. No aggressive or acute appearing osseous lesions. 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 IMPRESSION: 1. Focal area at the pancreatic tail what she is nonspecific in etiology on noncontrast enhanced CT and may potentially represent undulation pancreatic tissue, pancreatic tail mass for splenule. Recommend further evaluation with multiphase contrast-enhanced pancreatic protocol CT. 2. Hepatic steatosis. 3. Atrophic left kidney. 4. Gallbladder sludge 5. These results will be called to the ordering clinician or representative by the Radiologist Assistant, and communication documented in the PACS or zVision Dashboard. Electronically Signed   By: Annia Belt M.D.   On: 2017/05/05 13:46   Dg Chest 2 View  Result Date: 04/12/2017 CLINICAL DATA:  Elevated white blood cell count, mental status change. History of diabetes, end-stage renal disease on dialysis. Current  smoker. EXAM: CHEST  2 VIEW COMPARISON:  Chest x-ray of April 03, 2017 FINDINGS: The lungs are adequately inflated. There is no focal infiltrate. The cardiac silhouette is enlarged. The pulmonary vascularity is normal. The dialysis catheter tip projects over the distal third of the SVC. IMPRESSION: Stable cardiomegaly without pulmonary vascular congestion or pulmonary edema. No acute pneumonia. Electronically Signed   By: David  [place] M.D.   On: 04/12/2017 13:21   Dg Abd 2 Views  Result Date: May 05, 2017 CLINICAL DATA:  Pain all over.  Leukocytosis . EXAM: ABDOMEN - 2 VIEW COMPARISON:  Ultrasound 03/31/2017 FINDINGS: Limited exam due to patient positioning and movement. Right side and lower pelvis not imaged. Soft tissue structures are unremarkable. Contrast in the colon. Radiopacity noted over the right abdomen may represent a pill fragment. No bowel distention. No free air. IMPRESSION: Limited exam.  No acute abnormality identified. Electronically Signed   By: Maisie Fus  Register   On: May 05, 2017 09:36     Assessment/Plan: Clostridium difficile infection = appears to be colonized with toxigenic cdifficile strain though does not have significant diarrhea. Wonder if  she is developing ileus. Continue with oral vancomycin at the moment. She is receiving lactulose for hyperkalemia so anticipate that she should have loose stools today.  Leukocytosis = improving slightly. Concern is that her leukocytosis maybe due to multiple causes, not just cdifficile.   Abdominal discomfort = continue to monitor, concern part of her pain is due to calciphylaxis  Pancreatic tail lesion = getting dedicated panc CT to see if it can be better characterized  Winfield Caba, [facility] Cell: [phone] Pager: [phone]  04/14/2017, 11:17 AM

## 2017-04-14 NOTE — Progress Notes (Signed)
PROGRESS NOTE  Teresa Dalton ZOX:[id number] DOB: 1966/06/02 DOA: 03/23/2017 PCP: Patient, No Pcp Per  HPI/Recap of past 63 hours: 51 year old female with history of hypertension, diabetes, hyperlipidemia, CKD, presents with bilateral foot pain associated with lower extremity edema.  Patient was also noted to be C. difficile positive on admission as patient was having diarrhea.  Patient is currently being managed for end-stage renal disease requiring dialysis.  Today, patient still lethargic, has been refusing to eat for the past day.  Responds to questions appropriately, but sleeps off after a few minutes.  Intermittently moaning in pain, reports bilateral lower extremity hurts.  Unable to complete ROS due to her being lethargic.  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 difficile diarrhea   #Acute metabolic encephalopathy Unclear etiology Sepsis Vs ?uremia Vs med induced Vs ileus Afebrile, severe leukocytosis Lactic acid trended down, pro calcitonin high Repeat blood culture x2 pending PT urine culture pending Chest x-ray unremarkable CT head negative CT abdomen/pelvis showed hepatic steatosis, focal area of the pancreatic tail, nonspecific.  Recommend CT pancreatic protocol Had 1 dose of IV vancomycin and Zosyn, was discontinued by infectious disease team ID on board, do not think patient is septic, could be ileus, unclear etiology, will monitor, continue oral vancomycin  #C. difficile diarrhea Patient no longer having diarrhea for the past 24-48 hours Afebrile, leukocytosis trending upwards ?? 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  Deeper punch biopsy needed to diagnose calciphylaxis Vascular surgery recommending arteriogram, will proceed once patient's condition improves Continue treatment for calciphylaxis during dialysis  #Anemia of chronic kidney disease Continue IV iron and ESA during dialysis  #Type 2 diabetes Continue current insulin regimen  #Hyperlipidemia Continue statin    Code Status: Full  Family Communication: None at bedside  Disposition Plan: Home once stable   Consultants:  Nephrologist  Vascular surgeon  General surgeon  ID  Procedures:  AV fistula on 10/25  Antimicrobials:  P.o. Vancomycin  Received 1 dose of IV Zosyn and vancomycin, discontinued by ID on 11/2  DVT prophylaxis: Heparin   Objective: Vitals:   05/04/2017 2200 04/14/17 0411 04/14/17 0949 04/14/17 1345  BP: 127/78 120/64 134/66 126/69  Pulse: 85 98 89 89  Resp: 19 20 (!) 22 18  Temp: 98.5 F (36.9 C) 98.2 F (36.8 C) 98 F (36.7 C) 98.2 F (36.8 C)  TempSrc: Oral Oral Oral Oral  SpO2: 95% 98% 100% 100%  Weight: 109.5 kg (241 lb 6.5 oz)   109.5 kg (241 lb 6.5 oz)  Height:        Intake/Output Summary (Last 24 hours) at 04/14/17 1551 Last data filed at 04/14/17 0900  Gross per 24 hour  Intake              135 ml  Output                0 ml  Net  135 ml   Filed Weights   04/27/2017 1128 05/08/2017 2200 04/14/17 1345  Weight: 110.3 kg (243 lb 2.7 oz) 109.5 kg (241 lb 6.5 oz) 109.5 kg (241 lb 6.5 oz)    Exam:   General: Alert, responds to command, lethargic  Cardiovascular: S1-S2 present, no added heart sounds  Respiratory: Chest clear bilaterally  Abdomen: Soft, mild diffuse tenderness, bowel sounds  present  Musculoskeletal: No bilateral pedal edema, tender to touch lower extremities, superficial wound on the lower left extremity  Skin: Normal  Psychiatry: Lethargic  Neuro: No focal neurologic deficit   Data Reviewed: CBC:  Recent Labs Lab 04/09/17 0209 04/10/17 0808 04/11/17 0833 04/12/17 0406 04/12/2017 0200 04/14/17 0459  WBC 16.8* 14.2* 16.4* 20.4* 30.0* 27.0*  NEUTROABS 13.1*  --  12.1* 15.7* 23.7* 22.1*  HGB 8.0* 7.3* 8.3* 8.6* 8.7* 8.5*  HCT 25.7* 24.3* 26.9* 27.1* 27.5* 26.6*  MCV 94.5 93.8 93.4 92.5 91.4 90.8  PLT 278 278 318 386 325 329   Basic Metabolic Panel:  Recent Labs Lab 04/08/17 0120 04/09/17 0209 04/10/17 0808 04/11/17 0833 04/12/17 0406 04/15/2017 0200 04/16/2017 0639 04/22/2017 1831 04/14/17 0459  NA 136 135 136 136 136 141  --  138 138  K 4.9 4.6 4.8 5.0 5.5* 6.1* 6.4* 5.2* 6.2*  CL 97* 95* 96* 96* 94* 89*  --  92* 89*  CO2 24 25 22  21* 21* 17*  --  18* 18*  GLUCOSE 95 114* 88 100* 74 96  --  106* 98  BUN 48* 34* 56* 41* 67* 45*  --  70* 85*  CREATININE 10.51* 7.73* 10.01* 7.54* 9.03* 6.21*  --  6.96* 7.84*  CALCIUM 9.0 9.5 9.1 9.9 10.4* 11.1*  --  10.2 10.9*  PHOS 7.8* 7.6* 9.2*  --   --   --   --   --   --    GFR: Estimated Creatinine Clearance: 10.7 mL/min (A) (by C-G formula based on SCr of 7.84 mg/dL (H)). Liver Function Tests:  Recent Labs Lab 04/08/17 0120 04/09/17 0209 04/10/17 0808  ALBUMIN 2.5* 2.8* 2.6*   No results for input(s): LIPASE, AMYLASE in the last 168 hours. No results for input(s): AMMONIA in the last 168 hours. Coagulation Profile: No results for input(s): INR, PROTIME in the last 168 hours. Cardiac Enzymes:  Recent Labs Lab 04/09/17 1412  CKTOTAL 515*   BNP (last 3 results) No results for input(s): PROBNP in the last 8760 hours. HbA1C: No results for input(s): HGBA1C in the last 72 hours. CBG:  Recent Labs Lab 04/26/2017 1118 05/06/2017 1742 04/19/2017 2136 04/14/17 0746 04/14/17 1217  GLUCAP  104* 104* 94 115* 120*   Lipid Profile: No results for input(s): CHOL, HDL, LDLCALC, TRIG, CHOLHDL, LDLDIRECT in the last 72 hours. Thyroid Function Tests: No results for input(s): TSH, T4TOTAL, FREET4, T3FREE, THYROIDAB in the last 72 hours. Anemia Panel: No results for input(s): VITAMINB12, FOLATE, FERRITIN, TIBC, IRON, RETICCTPCT in the last 72 hours. Urine analysis:    Component Value Date/Time   COLORURINE YELLOW 04/10/2017 0540   APPEARANCEUR HAZY (A) 04/10/2017 0540   LABSPEC 1.000 (L) 04/10/2017 0540   PHURINE 7.0 04/10/2017 0540   GLUCOSEU NEGATIVE 04/10/2017 0540   HGBUR MODERATE (A) 04/10/2017 0540   BILIRUBINUR NEGATIVE 04/10/2017 0540   KETONESUR NEGATIVE 04/10/2017 0540   PROTEINUR 30 (A) 04/10/2017 0540   UROBILINOGEN 0.2 05/30/2010 1628   NITRITE NEGATIVE 04/10/2017 0540   LEUKOCYTESUR NEGATIVE 04/10/2017 0540   Sepsis Labs: @LABRCNTIP (procalcitonin:4,lacticidven:4)  )  Recent Results (from the past 240 hour(s))  Surgical pcr screen     Status: None   Collection Time: 03/25/2017  7:43 AM  Result Value Ref Range Status   MRSA, PCR NEGATIVE NEGATIVE Final   Staphylococcus aureus NEGATIVE NEGATIVE Final    Comment: (NOTE) The Xpert SA Assay (FDA approved for NASAL specimens in patients 83 years of age and older), is one component of a comprehensive surveillance program. It is not intended to diagnose infection nor to guide or monitor treatment.   C difficile quick scan w PCR reflex     Status: Abnormal   Collection Time: 04/09/17 11:52 AM  Result Value Ref Range Status   C Diff antigen POSITIVE (A) NEGATIVE Final   C Diff toxin NEGATIVE NEGATIVE Final   C Diff interpretation Results are indeterminate. See PCR results.  Final  Clostridium Difficile by PCR     Status: Abnormal   Collection Time: 04/09/17 11:52 AM  Result Value Ref Range Status   Toxigenic C Difficile by pcr POSITIVE (A) NEGATIVE Final    Comment: Positive for toxigenic C. difficile with  little to no toxin production. Only treat if clinical presentation suggests symptomatic illness.  Culture, blood (routine x 2)     Status: None (Preliminary result)   Collection Time: 13-May-2017 10:45 AM  Result Value Ref Range Status   Specimen Description BLOOD RIGHT PICC LINE  Final   Special Requests   Final    BOTTLES DRAWN AEROBIC AND ANAEROBIC Blood Culture results may not be optimal due to an excessive volume of blood received in culture bottles   Culture NO GROWTH 1 DAY  Final   Report Status PENDING  Incomplete  Culture, blood (routine x 2)     Status: None (Preliminary result)   Collection Time: May 13, 2017  4:00 PM  Result Value Ref Range Status   Specimen Description BLOOD RIGHT HAND  Final   Special Requests IN PEDIATRIC BOTTLE Blood Culture adequate volume  Final   Culture NO GROWTH < 24 HOURS  Final   Report Status PENDING  Incomplete      Studies: No results found.  Scheduled Meds: . amLODipine  10 mg Oral QHS  . bacitracin   Topical Once  . darbepoetin (ARANESP) injection - DIALYSIS  200 mcg Intravenous Q Thu-HD  . feeding supplement (NEPRO CARB STEADY)  237 mL Oral BID BM  . feeding supplement (PRO-STAT SUGAR FREE 64)  30 mL Oral BID  . gabapentin  300 mg Oral Daily  . heparin subcutaneous  5,000 Units Subcutaneous Q8H  . insulin aspart  0-15 Units Subcutaneous TID WC  . insulin detemir  32 Units Subcutaneous BID  . labetalol  200 mg Oral BID  . lanthanum  1,000 mg Oral TID WC  . lidocaine-EPINEPHrine  10 mL Intradermal Once  . multivitamin  1 tablet Oral QHS  . patiromer  8.4 g Oral Daily  . simvastatin  20 mg Oral Daily  . sucroferric oxyhydroxide  1,000 mg Oral TID WC  . vancomycin  125 mg Oral QID    Continuous Infusions: . ferric gluconate (FERRLECIT/NULECIT) IV 125 mg (04/14/17 1538)  . sodium thiosulfate infusion for calciphylaxis Stopped (04/12/17 1455)     LOS: 15 days     Briant Cedar, MD [facility]  If 7PM-7AM, please  contact night-coverage [email or web address] Password [email or web address] 04/14/2017, 3:51 PM

## 2017-04-14 NOTE — Significant Event (Signed)
Rapid Response Event Note RN called for CBG <10 Overview: Time Called: 2037 Arrival Time: 2038 Event Type: Other (Comment), Neurologic (CBG <10)  Initial Focused Assessment: On arrival, pt lying supine in bed, sleeping, easily to arouse to name, unable to stay awake and carry on conversation, oriented to self and location. Pt returned from dialysis at 1900. Pt calling out with severe pain to her Right side, Armed forces logistics/support/administrative officerCathy RN received new order for 1 mg Dilaudid IVP for pain control. Per RN pt was found leaning over side of bed rail, gown and sheets saturated with sweat, flailing arms and kegs, moaning uncontrollably when Pulte HomesCathy RN returned to room with medication. She checked pt's CBG which resulted a critical value <10 @2038 , 2 amps D50 given, CBG 437 with recheck @2038 , I did a recheck @2051  and CBG 256. SBP 60-70's, HR 68, RR 20, 94% RA.  Pt's SBP 110, HR 58, RR 18, 94% RA Pt has not been eating due to increase abd pain   Interventions: 250 cc bolus NS given, 0.5 mg Dilaudid, pt transported to CT on monitor with SWOT RN Jasmine DecemberSharon. SBP 140, Hr 68, RR 18, 95% RA, pt received remaining dose of 0.5 mg Dilaudid IVP in CT. CBG 207 Cathy RN paged Blount NP to make her aware, to determine where plan of care.  Plan of Care (if not transferred): Continue to monitor pt, please see above note Event Summary: Name of Physician Notified: Blount NP  at 2042    at    Outcome: Stayed in room and stabalized     BradySHULAR, Nysia Dell HeathPaige

## 2017-04-14 NOTE — Progress Notes (Signed)
[facility] Progress Note    Assessment/ Plan:   1. Leukocytosis, sepsis: a little better today, abd/pelvis with ? Mass vs splenule vs tissue in pancreatic tail, going for CT panc.  Since there is no [place] or intra-abdominal abscess, I am suspicious that the areas of induration felt in the pannus may develop new calciphylaxis lesions.  No broad-spectrum abx at this time, continuing PO vanc  2. ESRD - TTS schedule.  CLIP'd to [facility] when ready for d/c but there are multiple barriers.  2. Calciphylaxis: bx consistent with necrotic tissue and thrombotic vasculopathy; deep panniculus biopsy recommended. started Na thiosulfate (10/30-) TIW with dialysis 3. Peripheral arterial disease: angio rescheduled. 4. HTN - continue amlodipine 5. 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 DM - per primary 8. Obesity - will need continued encouragement  9. Hyperlipidemia - continue simvastatin  10. C diff: on po vanc 11. Dispo: I think she needs a pall care c/s- I've placed   Subjective:    K still a little high, in pain, can't articulate where   Objective:   BP 134/66 (BP Location: Right Arm)   Pulse 89   Temp 98 F (36.7 C) (Oral)   Resp (!) 22   Ht 5\' 5"  (1.651 m)   Wt 109.5 kg (241 lb 6.5 oz)   LMP 03/26/2017   SpO2 100%   BMI 40.17 kg/m   Intake/Output Summary (Last 24 hours) at 04/14/17 1031 Last data filed at 04/14/17 0900  Gross per 24 hour  Intake           978.33 ml  Output             -666 ml  Net          1644.33 ml   Weight change: 2.4 kg (5 lb 4.7 oz)  Physical Exam: Gen: lying in bed HEENT: EOMI, sclerae anicteric CVS: regular rate and rhythm. Resp: clear bilaterally. Abd: soft, tender today with possible 4-5 cm area of [place] fluctuance in the pannus without erythema unchangeobese, bowel sounds quiet. Ext: no LE edema with multiple dark violaceous and black eschar, indurated, painful nodules  on bilateral shins/ calves with  R > L and necrosis ACCESS: New aVF LUA, PC R IJ  Imaging: Ct Abdomen Pelvis Wo Contrast  Result Date: 04/19/2017 CLINICAL DATA:  Patient with history of back pain. Evaluate for malignancy. EXAM: CT ABDOMEN AND PELVIS WITHOUT CONTRAST TECHNIQUE: Multidetector CT imaging of the abdomen and pelvis was performed following the standard protocol without IV contrast. COMPARISON:  Abdominal radiograph 04/23/2017; chest radiograph 04/12/2017. FINDINGS: Lower chest: Heart is enlarged. Prominent subcentimeter inferior mediastinal lymph nodes. Hepatobiliary: Liver is diffusely low in attenuation compatible with steatosis. High attenuation within the gallbladder lumen suggestive of sludge. No intrahepatic or extrahepatic biliary ductal dilatation. Pancreas: No inflammatory changes. Within the region the pancreatic tail there is a 3.2 cm area which has a slightly different morphology within the remainder the pancreatic head and body. Spleen: Unremarkable Adrenals/Urinary Tract: Normal adrenal glands. The left kidney is atrophic. Right kidney is normal in contour. No hydronephrosis. Urinary bladder is unremarkable. Stomach/Bowel: Descending and sigmoid colonic diverticulosis. No CT evidence for acute diverticulitis. Normal appendix. Normal morphology of the stomach. No free fluid or free intraperitoneal air. Vascular/Lymphatic: Normal caliber abdominal aorta. Peripheral calcified atherosclerotic plaque. No retroperitoneal lymphadenopathy. Reproductive: Uterus and adnexal structures are unremarkable. Other: Soft tissue anasarca. Musculoskeletal: Lumbar spine degenerative changes.  No aggressive or acute appearing osseous lesions. 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 IMPRESSION: 1. Focal area at the pancreatic tail what she is nonspecific in etiology on noncontrast enhanced CT and may potentially  represent undulation pancreatic tissue, pancreatic tail mass for splenule. Recommend further evaluation with multiphase contrast-enhanced pancreatic protocol CT. 2. Hepatic steatosis. 3. Atrophic left kidney. 4. Gallbladder sludge 5. These results will be called to the ordering clinician or representative by the Radiologist Assistant, and communication documented in the PACS or zVision Dashboard. Electronically Signed   By: Annia Beltrew  Davis M.D.   On: 06-05-2017 13:46   Dg Chest 2 View  Result Date: 04/12/2017 CLINICAL DATA:  Elevated white blood cell count, mental status change. History of diabetes, end-stage renal disease on dialysis. Current smoker. EXAM: CHEST  2 VIEW COMPARISON:  Chest x-ray of April 03, 2017 FINDINGS: The lungs are adequately inflated. There is no focal infiltrate. The cardiac silhouette is enlarged. The pulmonary vascularity is normal. The dialysis catheter tip projects over the distal third of the SVC. IMPRESSION: Stable cardiomegaly without pulmonary vascular congestion or pulmonary edema. No acute pneumonia. Electronically Signed   By: David  Swazil[place] M.D.   On: 04/12/2017 13:21   Dg Abd 2 Views  Result Date: 04/27/2017 CLINICAL DATA:  Pain all over.  Leukocytosis . EXAM: ABDOMEN - 2 VIEW COMPARISON:  Ultrasound 03/28/2017 FINDINGS: Limited exam due to patient positioning and movement. Right side and lower pelvis not imaged. Soft tissue structures are unremarkable. Contrast in the colon. Radiopacity noted over the right abdomen may represent a pill fragment. No bowel distention. No free air. IMPRESSION: Limited exam.  No acute abnormality identified. Electronically Signed   By: Maisie Fushomas  Register   On: 06-05-2017 09:36    Labs: BMET  Recent Labs Lab 04/08/17 0120 04/09/17 [id number] 04/10/17 [id number] 04/11/17 [id number] 04/12/17 0406 09-05-2016 0200 09-05-2016 0639 09-05-2016 1831 04/14/17 0459  NA 136 135 136 136 136 141  --  138 138  K 4.9 4.6 4.8 5.0 5.5* 6.1* 6.4* 5.2* 6.2*  CL 97* 95*  96* 96* 94* 89*  --  92* 89*  CO2 24 25 22  21* 21* 17*  --  18* 18*  GLUCOSE 95 114* 88 100* 74 96  --  106* 98  BUN 48* 34* 56* 41* 67* 45*  --  70* 85*  CREATININE 10.51* 7.73* 10.01* 7.54* 9.03* 6.21*  --  6.96* 7.84*  CALCIUM 9.0 9.5 9.1 9.9 10.4* 11.1*  --  10.2 10.9*  PHOS 7.8* 7.6* 9.2*  --   --   --   --   --   --    CBC  Recent Labs Lab 04/11/17 0833 04/12/17 0406 09-05-2016 0200 04/14/17 0459  WBC 16.4* 20.4* 30.0* 27.0*  NEUTROABS 12.1* 15.7* 23.7* 22.1*  HGB 8.3* 8.6* 8.7* 8.5*  HCT 26.9* 27.1* 27.5* 26.6*  MCV 93.4 92.5 91.4 90.8  PLT 318 386 325 329    Medications:    . amLODipine  10 mg Oral QHS  . bacitracin   Topical Once  . darbepoetin (ARANESP) injection - DIALYSIS  200 mcg Intravenous Q Thu-HD  . feeding supplement (NEPRO CARB STEADY)  237 mL Oral BID BM  . feeding supplement (PRO-STAT SUGAR FREE 64)  30 mL Oral BID  . gabapentin  300 mg Oral Daily  . heparin subcutaneous  5,000 Units Subcutaneous Q8H  . insulin aspart  0-15 Units Subcutaneous TID WC  .  insulin detemir  32 Units Subcutaneous BID  . labetalol  200 mg Oral BID  . lanthanum  1,000 mg Oral TID WC  . lidocaine-EPINEPHrine  10 mL Intradermal Once  . multivitamin  1 tablet Oral QHS  . patiromer  8.4 g Oral Daily  . simvastatin  20 mg Oral Daily  . sodium polystyrene  30 g Oral Once  . sucroferric oxyhydroxide  1,000 mg Oral TID WC  . vancomycin  125 mg Oral QID      Bufford Buttner MD [facility] pgr [phone] 04/14/2017, 10:31 AM

## 2017-04-15 ENCOUNTER — Other Ambulatory Visit: Payer: Self-pay

## 2017-04-15 DIAGNOSIS — R740 Nonspecific elevation of levels of transaminase and lactic acid dehydrogenase [LDH]: Secondary | ICD-10-CM

## 2017-04-15 DIAGNOSIS — R7401 Elevation of levels of liver transaminase levels: Secondary | ICD-10-CM

## 2017-04-15 DIAGNOSIS — E162 Hypoglycemia, unspecified: Secondary | ICD-10-CM

## 2017-04-15 DIAGNOSIS — Z515 Encounter for palliative care: Secondary | ICD-10-CM

## 2017-04-15 LAB — CBC WITH DIFFERENTIAL/PLATELET
BASOS ABS: 0.3 10*3/uL — AB (ref 0.0–0.1)
Basophils Relative: 1 %
Eosinophils Absolute: 0 10*3/uL (ref 0.0–0.7)
Eosinophils Relative: 0 %
HCT: 27.5 % — ABNORMAL LOW (ref 36.0–46.0)
Hemoglobin: 8.6 g/dL — ABNORMAL LOW (ref 12.0–15.0)
LYMPHS ABS: 2.2 10*3/uL (ref 0.7–4.0)
Lymphocytes Relative: 7 %
MCH: 29.1 pg (ref 26.0–34.0)
MCHC: 31.3 g/dL (ref 30.0–36.0)
MCV: 92.9 fL (ref 78.0–100.0)
MONO ABS: 2.5 10*3/uL — AB (ref 0.1–1.0)
Monocytes Relative: 8 %
NEUTROS PCT: 84 %
Neutro Abs: 26.2 10*3/uL — ABNORMAL HIGH (ref 1.7–7.7)
Platelets: 261 10*3/uL (ref 150–400)
RBC: 2.96 MIL/uL — AB (ref 3.87–5.11)
RDW: 14.8 % (ref 11.5–15.5)
WBC: 31.2 10*3/uL — AB (ref 4.0–10.5)

## 2017-04-15 LAB — RENAL FUNCTION PANEL
ALBUMIN: 2.6 g/dL — AB (ref 3.5–5.0)
ANION GAP: 24 — AB (ref 5–15)
BUN: 62 mg/dL — AB (ref 6–20)
CALCIUM: 9.9 mg/dL (ref 8.9–10.3)
CO2: 18 mmol/L — AB (ref 22–32)
Chloride: 93 mmol/L — ABNORMAL LOW (ref 101–111)
Creatinine, Ser: 5.88 mg/dL — ABNORMAL HIGH (ref 0.44–1.00)
GFR calc Af Amer: 9 mL/min — ABNORMAL LOW (ref >60)
GFR calc non Af Amer: 8 mL/min — ABNORMAL LOW (ref >60)
GLUCOSE: 115 mg/dL — AB (ref 65–99)
PHOSPHORUS: 10.9 mg/dL — AB (ref 2.5–4.6)
Potassium: 5.4 mmol/L — ABNORMAL HIGH (ref 3.5–5.1)
SODIUM: 135 mmol/L (ref 135–145)

## 2017-04-15 LAB — COMPREHENSIVE METABOLIC PANEL
ALT: 266 U/L — ABNORMAL HIGH (ref 14–54)
AST: 1058 U/L — AB (ref 15–41)
Albumin: 2.5 g/dL — ABNORMAL LOW (ref 3.5–5.0)
Alkaline Phosphatase: 136 U/L — ABNORMAL HIGH (ref 38–126)
Anion gap: 25 — ABNORMAL HIGH (ref 5–15)
BILIRUBIN TOTAL: 0.9 mg/dL (ref 0.3–1.2)
BUN: 60 mg/dL — AB (ref 6–20)
CO2: 18 mmol/L — ABNORMAL LOW (ref 22–32)
CREATININE: 5.96 mg/dL — AB (ref 0.44–1.00)
Calcium: 10 mg/dL (ref 8.9–10.3)
Chloride: 93 mmol/L — ABNORMAL LOW (ref 101–111)
GFR, EST AFRICAN AMERICAN: 9 mL/min — AB (ref >60)
GFR, EST NON AFRICAN AMERICAN: 8 mL/min — AB (ref >60)
Glucose, Bld: 123 mg/dL — ABNORMAL HIGH (ref 65–99)
POTASSIUM: 5.3 mmol/L — AB (ref 3.5–5.1)
Sodium: 136 mmol/L (ref 135–145)
TOTAL PROTEIN: 7.4 g/dL (ref 6.5–8.1)

## 2017-04-15 LAB — GLUCOSE, CAPILLARY
GLUCOSE-CAPILLARY: 148 mg/dL — AB (ref 65–99)
Glucose-Capillary: 93 mg/dL (ref 65–99)
Glucose-Capillary: 116 mg/dL — ABNORMAL HIGH (ref 65–99)
Glucose-Capillary: 136 mg/dL — ABNORMAL HIGH (ref 65–99)
Glucose-Capillary: 141 mg/dL — ABNORMAL HIGH (ref 65–99)
Glucose-Capillary: 144 mg/dL — ABNORMAL HIGH (ref 65–99)

## 2017-04-15 LAB — APTT: APTT: 38 s — AB (ref 24–36)

## 2017-04-15 LAB — PROTIME-INR
INR: 1.73
Prothrombin Time: 20.1 seconds — ABNORMAL HIGH (ref 11.4–15.2)

## 2017-04-15 LAB — AMMONIA: AMMONIA: 34 umol/L (ref 9–35)

## 2017-04-15 MED ORDER — VANCOMYCIN 50 MG/ML ORAL SOLUTION
500.0000 mg | Freq: Four times a day (QID) | ORAL | Status: DC
  Administered 2017-04-16 – 2017-04-17 (×4): 500 mg via ORAL
  Filled 2017-04-15 (×12): qty 10

## 2017-04-15 MED ORDER — FENTANYL CITRATE (PF) 100 MCG/2ML IJ SOLN
25.0000 ug | INTRAMUSCULAR | Status: DC | PRN
  Administered 2017-04-15 – 2017-04-16 (×2): 50 ug via INTRAVENOUS
  Filled 2017-04-15 (×3): qty 2

## 2017-04-15 MED ORDER — HYDRALAZINE HCL 20 MG/ML IJ SOLN: 10.0000 mg | Freq: Three times a day (TID) | INTRAMUSCULAR | Status: DC | PRN

## 2017-04-15 NOTE — Progress Notes (Signed)
Last pm  Insulin and b/p meds held per order. Patient continues to occasionally moan and groan with abd pain.Repositioned for comfort.

## 2017-04-15 NOTE — Progress Notes (Signed)
[facility]    Date of Admission:  04/10/2017   Total days of antibiotics 6        Day 3 oral vanco          ID: Teresa Stearns V. is a 51 y.o. female with  ESRD c/b calciphylaxis having leukocytosis abdominal pain concern for cdifficile vs other infectious vs. 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 difficile diarrhea    Subjective: Continues to have persistent abdominal pain, had symptomatic hypoglycemic even with BS measured critical at 10 last night at 8:30 required rapid response with in addition to have sBP in the 60-70s. She was given several bolus D50 but also given dilaudid to treat pain  She is sleeping soundly this afternoon, denies abdominal pain or diarrhea  This morning lab still shows elevated WBC of 30K and transaminitis AST 1000> ALT 200s  Medications:  . amLODipine  10 mg Oral QHS  . bacitracin   Topical Once  . darbepoetin (ARANESP) injection - DIALYSIS  200 mcg Intravenous Q Thu-HD  . feeding supplement (NEPRO CARB STEADY)  237 mL Oral BID BM  . feeding supplement (PRO-STAT SUGAR FREE 64)  30 mL Oral BID  . gabapentin  300 mg Oral Daily  . heparin subcutaneous  5,000 Units Subcutaneous Q8H  . insulin aspart  0-15 Units Subcutaneous TID WC  . labetalol  200 mg Oral BID  . lanthanum  1,000 mg Oral TID WC  . lidocaine-EPINEPHrine  10 mL Intradermal Once  . multivitamin  1 tablet Oral QHS  . patiromer  8.4 g Oral Daily  . simvastatin  20 mg Oral Daily  . sucroferric oxyhydroxide  1,000 mg Oral TID WC  . vancomycin  500 mg Oral QID    Objective: Vital signs in last 24 hours: Temp:  [98 F (36.7 C)-98.5 F  (36.9 C)] 98.2 F (36.8 C) (11/04 0915) Pulse Rate:  [62-89] 87 (11/04 0915) Resp:  [18-22] 18 (11/04 0915) BP: (93-157)/(43-89) 157/75 (11/04 0915) SpO2:  [94 %-100 %] 97 % (11/04 0915) Weight:  [223 lb 12.3 oz (101.5 kg)-241 lb 6.5 oz (109.5 kg)] 223 lb 12.3 oz (101.5 kg) (11/03 1936) Physical Exam  Constitutional:  oriented to person only appears well-developed and easily arousable HENT: [place]/AT, PERRLA, no scleral icterus Abdominal: Soft. Bowel sounds are slow.  exhibits no distension. Bowel sounds heard more frequently than yesterday, no guarding Skin: Skin changes are stable -LE have areas of black eschar and painful scattered nodules with dark/deep purple macules most noticeably R>L in anterior tibial region and calves Psychiatric: a normal mood and affect.  behavior is normal.    Lab Results Recent Labs    04/14/17 0459 04/15/17 0334  WBC 27.0* 31.2*  HGB 8.5* 8.6*  HCT 26.6* 27.5*  NA 138 135  136  K 6.2* 5.4*  5.3*  CL 89* 93*  93*  CO2 18* 18*  18*  BUN 85* 62*  60*  CREATININE 7.84* 5.88*  5.96*    Microbiology: 11/2 blood cx ngtd 10/23 blood cx ngtd Studies/Results: Ct Abdomen Pelvis Wo Contrast  Result Date:  04/25/2017 CLINICAL DATA:  Patient with history of back pain. Evaluate for malignancy. EXAM: CT ABDOMEN AND PELVIS WITHOUT CONTRAST TECHNIQUE: Multidetector CT imaging of the abdomen and pelvis was performed following the standard protocol without IV contrast. COMPARISON:  Abdominal radiograph 04/27/2017; chest radiograph 04/12/2017. FINDINGS: Lower chest: Heart is enlarged. Prominent subcentimeter inferior mediastinal lymph nodes. Hepatobiliary: Liver is diffusely low in attenuation compatible with steatosis. High attenuation within the gallbladder lumen suggestive of sludge. No intrahepatic or extrahepatic biliary ductal dilatation. Pancreas: No inflammatory changes. Within the region the pancreatic tail there is a 3.2 cm area which has a slightly  different morphology within the remainder the pancreatic head and body. Spleen: Unremarkable Adrenals/Urinary Tract: Normal adrenal glands. The left kidney is atrophic. Right kidney is normal in contour. No hydronephrosis. Urinary bladder is unremarkable. Stomach/Bowel: Descending and sigmoid colonic diverticulosis. No CT evidence for acute diverticulitis. Normal appendix. Normal morphology of the stomach. No free fluid or free intraperitoneal air. Vascular/Lymphatic: Normal caliber abdominal aorta. Peripheral calcified atherosclerotic plaque. No retroperitoneal lymphadenopathy. Reproductive: Uterus and adnexal structures are unremarkable. Other: Soft tissue anasarca. Musculoskeletal: Lumbar spine degenerative changes. No aggressive or acute appearing osseous lesions. 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 IMPRESSION: 1. Focal area at the pancreatic tail what she is nonspecific in etiology on noncontrast enhanced CT and may potentially represent undulation pancreatic tissue, pancreatic tail mass for splenule. Recommend further evaluation with multiphase contrast-enhanced pancreatic protocol CT. 2. Hepatic steatosis. 3. Atrophic left kidney. 4. Gallbladder sludge 5. These results will be called to the ordering clinician or representative by the Radiologist Assistant, and communication documented in the PACS or zVision Dashboard. Electronically Signed   By: Annia Beltrew  Davis M.D.   On: 04/19/2017 13:46     Assessment/Plan: Clostridium difficile infection = appears to be colonized with toxigenic cdifficile strain though does not have significant diarrhea. If she complains of worsening abdominal pain recommend to check abd xrayto see if dilated bowel. Will increase oral vancomycin to 500mg  PO QID for now.  transaminitis = likely due to hypotension event associated with hypoglycemia lastn ight   Leukocytosis = not improved,  currently at 30K. Concern is that her leukocytosis maybe due to multiple causes, not just cdifficile.   Calciphylaxis = appears to have significant pain to not only abdomen but also legs and suspect that is driving her current presentation of pain. Agree with dr Signe Coltupton with recommendations for palliative care consultation  Abdominal discomfort = continue to monitor, concern part of her pain is due to calciphylaxis, recommend serial exams to see that it not worsening.  Pancreatic tail lesion = current plan is to get dedicated panc CT to see if it can be better characterized  Lacey Dotson, [facility] Cell: [phone] Pager: [phone]  04/15/2017, 10:26 AM

## 2017-04-15 NOTE — Consult Note (Signed)
Consultation Note Date: 04/15/2017   Patient Name: Teresa Dalton  DOB: December 11, 1965  MRN: [id number]  Age / Sex: 51 y.o., female  PCP: Patient, No Pcp Per Referring Physician: Briant Cedar, MD  Reason for Consultation: Establishing goals of care, Pain control and Psychosocial/spiritual support  HPI/Patient Profile: 51 y.o. female  with past medical history of ESRD on HD, DM, HTN, HLD admitted on 04-12-2017 with LE swelling. She has had a prolonged hospitalization with difficulty with HD, now with calciphylaxis. She has been in severe pain to LE as well as abd. Stool positive for c.diff but no diarrhea. She is being followed by nephrology, as well as ID.  Rapid response called last night. Pt had CBG <10 and required a fluid bolus. Per chart review, pt has been in severe pain. Leukocytosis worsening, WBC 31.2, AST >1000, ALT 200's, albumin 2.5, ammonia 34, Hgb 8.6. She is minimally responsive to voice. Not eating.   Consult ordered for GOC in the setting of ESRD and calciphylaxis.   Clinical Assessment and Goals of Care: Chart reviewed. Pt too ill to participate in GOC discussion. Will open her eyes briefly to voice, can nod yes/no  Pt has 2 sons, [address] and Roland. Numbers are in Epic under demogrphics    SUMMARY OF RECOMMENDATIONS   Continue with full scope f treatment Family mtg scheduled for 1100am 11/5 Code Status/Advance Care Planning:  Full code    Symptom Management:   Pain: Pt very fragile but in SEVERE pain with calciphylaxis. Discussed with Dr. Sharolyn Douglas. Will add Fentanyl 25-50 mcg q4 PRN  Palliative Prophylaxis:   Aspiration, Bowel Regimen, Delirium Protocol, Eye Care, Frequent Pain Assessment, Oral Care and Turn Reposition  Additional Recommendations (Limitations, Scope, Preferences):  Full Scope Treatment  Psycho-social/Spiritual:   Desire for  further Chaplaincy support:no  Additional Recommendations: Grief/Bereavement Support  Prognosis:   Pt appears critically ill. I am concerned that pt could have a limited prognosis despite HD, ID as well as other interventions  Discharge Planning: To Be Determined      Primary Diagnoses: Present on Admission: . Acute on chronic kidney failure (HCC) . 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 Essential hypertension   I have reviewed the medical record, interviewed the patient and family, and examined the patient. The following aspects are pertinent.  Past Medical History:  Diagnosis Date  . Anemia of chronic disease    /notes 04/02/2017  . CKD (chronic kidney disease), stage IV (HCC)    rin 2015/notes 04/02/2017; enal insufficiency (1.6-1.9 creatinine)  . ESRD (end stage renal disease) on dialysis [facility])    "new onset; just started dialysis 2 days ago" (04/03/2017)  . Hyperlipidemia   . Hypertension   . Thyroid disease    ? past problem  . Type II diabetes mellitus (HCC)    Social History   Socioeconomic History  . Marital status: Single    Spouse name: None  . Number of children: 2  . Years of education: None  . Highest  education level: None  Social Needs  . Financial resource strain: None  . Food insecurity - worry: None  . Food insecurity - inability: None  . Transportation needs - medical: None  . Transportation needs - non-medical: None  Occupational History  . Occupation: Management consultant: [employer]  Tobacco Use  . Smoking status: Current Every Day Smoker    Packs/day: 0.25    Years: 30.00    Pack years: 7.50    Types: Cigarettes  . Smokeless tobacco: Never Used  Substance and Sexual Activity  . Alcohol use: No  . Drug use: Yes    Types: Marijuana    Comment: 04/03/2017 "not a routine thing"  . Sexual activity: Not Currently  Other Topics Concern  . None  Social History Narrative   Regular exercise: no   Lives  with one of her sons            Family History  Problem Relation Age of Onset  . Diabetes Mother   . Heart disease Mother   . Hyperlipidemia Mother   . Cancer Father        lung, non-smoker. deceased 12yr  . Diabetes Sister   . Heart disease Brother        MI @ 7   Scheduled Meds: . amLODipine  10 mg Oral QHS  . bacitracin   Topical Once  . darbepoetin (ARANESP) injection - DIALYSIS  200 mcg Intravenous Q Thu-HD  . feeding supplement (NEPRO CARB STEADY)  237 mL Oral BID BM  . feeding supplement (PRO-STAT SUGAR FREE 64)  30 mL Oral BID  . gabapentin  300 mg Oral Daily  . heparin subcutaneous  5,000 Units Subcutaneous Q8H  . insulin aspart  0-15 Units Subcutaneous TID WC  . labetalol  200 mg Oral BID  . lanthanum  1,000 mg Oral TID WC  . lidocaine-EPINEPHrine  10 mL Intradermal Once  . multivitamin  1 tablet Oral QHS  . patiromer  8.4 g Oral Daily  . simvastatin  20 mg Oral Daily  . sucroferric oxyhydroxide  1,000 mg Oral TID WC  . vancomycin  500 mg Oral QID   Continuous Infusions: . ferric gluconate (FERRLECIT/NULECIT) IV Stopped (04/14/17 1638)  . sodium chloride    . sodium thiosulfate infusion for calciphylaxis Stopped (04/14/17 1752)   PRN Meds:.acetaminophen **OR** acetaminophen, HYDROcodone-acetaminophen, lidocaine (PF), lidocaine, methocarbamol, MUSCLE RUB, ondansetron **OR** ondansetron (ZOFRAN) IV, sodium chloride Medications Prior to Admission:  Prior to Admission medications   Medication Sig Start Date End Date Taking? Authorizing Provider  acetaminophen (TYLENOL) 325 MG tablet Take 650 mg by mouth every 6 (six) hours as needed for mild pain.   Yes [provider]  amLODipine (NORVASC) 10 MG tablet Take 10 mg by mouth daily.   Yes [provider]  cloNIDine (CATAPRES) 0.3 MG tablet Take 1 tablet (0.3 mg total) by mouth 2 (two) times daily. 11/27/15  Yes Focht, Jessica L, PA  gabapentin (NEURONTIN) 300 MG capsule Take 1 capsule (300 mg total)  by mouth 3 (three) times daily. 02/15/17  Yes Dorena Bodo, NP  glimepiride (AMARYL) 4 MG tablet Take 1 tablet (4 mg total) by mouth daily with breakfast. 11/27/15  Yes Focht, Jessica L, PA  hydrochlorothiazide (HYDRODIURIL) 25 MG tablet Take 25 mg by mouth daily.  07/21/11  Yes Sandford Craze, NP  insulin detemir (LEVEMIR FLEXPEN) 100 UNIT/ML injection Inject 40 Units into the skin 2 (two) times daily. Patient taking differently:  Inject 70 Units into the skin 2 (two) times daily.  08/14/11  Yes Sandford Craze'Sullivan, Melissa, NP  losartan (COZAAR) 50 MG tablet Take 1 tablet (50 mg total) by mouth daily. 11/27/15  Yes Focht, Jessica L, PA  metoprolol tartrate (LOPRESSOR) 50 MG tablet Take 50 mg by mouth 2 (two) times daily.   Yes [provider]  simvastatin (ZOCOR) 20 MG tablet Take 1 tablet (20 mg total) by mouth daily. 11/27/15  Yes Focht, Jessica L, PA  sodium bicarbonate 650 MG tablet Take 1,300 mg by mouth 2 (two) times daily.   Yes [provider]  Insulin Pen Needle 31G X 5 MM MISC Use with insulin injection two times a day. 03/21/11   Sandford Craze'Sullivan, Melissa, NP  Multiple Vitamins-Minerals (MULTIPLE VITAMINS/WOMENS PO) Take 1 tablet by mouth daily.      [provider]   No Known Allergies Review of Systems  Unable to perform ROS: Acuity of condition    Physical Exam  Constitutional:  Acutely ill appearing middle aged female  HENT:  Head: Normocephalic and atraumatic.  Neck: Normal range of motion.  Cardiovascular:  tachy  Pulmonary/Chest:  tachypneic RR 40/min  Abdominal: There is tenderness.  Neurological:  Minimally responsive. Opens her eyes to voice. Can only answer yes/no  Skin: Skin is warm and dry.  Psychiatric:  Unable to test  Nursing note and vitals reviewed.   Vital Signs: BP (!) 157/75 (BP Location: Right Arm)   Pulse 87   Temp 98.2 F (36.8 C) (Oral)   Resp 18   Ht 5\' 5"  (1.651 m)   Wt 101.5 kg (223 lb 12.3 oz)   LMP 03/26/2017   SpO2 97%    BMI 37.24 kg/m  Pain Assessment: 0-10 POSS *See Group Information*: 1-Acceptable,Awake and alert Pain Score: 0-No pain   SpO2: SpO2: 97 % O2 Device:SpO2: 97 % O2 Flow Rate: .O2 Flow Rate (L/min): 1 L/min  IO: Intake/output summary:   Intake/Output Summary (Last 24 hours) at 04/15/2017 1459 Last data filed at 04/15/2017 1428 Gross per 24 hour  Intake 230 ml  Output 1400 ml  Net -1170 ml    LBM: Last BM Date: 04/10/17 Baseline Weight: Weight: 90.7 kg (200 lb) Most recent weight: Weight: 101.5 kg (223 lb 12.3 oz)     Palliative Assessment/Data:   Flowsheet Rows     Most Recent Value  Intake Tab  Referral Department  Nephrology  Unit at Time of Referral  Med/Surg Unit  Palliative Care Primary Diagnosis  Nephrology  Date Notified  04/14/17  Palliative Care Type  New Palliative care  Reason for referral  Pain, Clarify Goals of Care  Date of Admission  03/19/2017  Date first seen by Palliative Care  04/15/17  # of days Palliative referral response time  1 Day(s)  # of days IP prior to Palliative referral  15  Clinical Assessment  Palliative Performance Scale Score  30%  Pain Max last 24 hours  Not able to report  Pain Min Last 24 hours  Not able to report  Dyspnea Max Last 24 Hours  Not able to report  Dyspnea Min Last 24 hours  Not able to report  Nausea Max Last 24 Hours  Not able to report  Nausea Min Last 24 Hours  Not able to report  Anxiety Max Last 24 Hours  Not able to report  Anxiety Min Last 24 Hours  Not able to report  Other Max Last 24 Hours  Not able to report  Psychosocial & Spiritual Assessment  Palliative Care Outcomes  Palliative Care follow-up planned  Yes, Facility      Time In: 1400  Time Out: 1500 Time Total: 60 min Greater than 50%  of this time was spent counseling and coordinating care related to the above assessment and plan. Staffed with Dr. Sharolyn Douglas  Signed by: Irean Hong, NP   Please contact Palliative Medicine Team phone  at [phone] for questions and concerns.  For individual provider: See Loretha Stapler

## 2017-04-15 NOTE — Progress Notes (Signed)
[facility] Progress Note    Assessment/ Plan:   1. Leukocytosis: worsening.  LFTs up too.  CT without abscess 11/2, CT panc pending.    2. ESRD - TTS schedule.  [facility] but there are multiple barriers. 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 started Na thiosulfate (10/30-) TIW with dialysis.  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  I've placed a pall care c/s to set goals of care.  She has been unable to tolerate some dialysis treatments due to pain and I am beginning to doubt the utility of long-term dialysis in this pt.   4. Peripheral arterial disease: angio rescheduled 5. HTN - continue amlodipine 6. 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 DM - per primary 9. Obesity - will need continued encouragement  10. Hyperlipidemia - continue simvastatin  11. C diff: on po vanc 12. Dispo: pending.   Subjective:    Pain all over, not eating, blood glucose 12 last night, had a rapid response.     Objective:   BP (!) 157/75 (BP Location: Right Arm)   Pulse 87   Temp 98.2 F (36.8 C) (Oral)   Resp 18   Ht 5\' 5"  (1.651 m)   Wt 101.5 kg (223 lb 12.3 oz)   LMP 03/26/2017   SpO2 97%   BMI 37.24 kg/m   Intake/Output Summary (Last 24 hours) at 04/15/2017 1005 Last data filed at 04/15/2017 0200 Gross per 24 hour  Intake 110 ml  Output 1400 ml  Net -1290 ml   Weight change: -0.2 kg (-7.1 oz)  Physical Exam: Gen: lying in bed, uncomfortable, clearly in pain HEENT: EOMI, sclerae anicteric CVS: regular rate and rhythm. Resp: clear bilaterally. Abd: soft, pannus exquisitely tender this AM, obese, bowel sounds quiet. Ext: no LE edema with multiple dark violaceous and black eschar, indurated, painful nodules on bilateral shins/ calves with  R > L and necrosis ACCESS: New  aVF LUA, PC R IJ  Imaging: Ct Abdomen Pelvis Wo Contrast  Result Date: 05/11/2017 CLINICAL DATA:  Patient with history of back pain. Evaluate for malignancy. EXAM: CT ABDOMEN AND PELVIS WITHOUT CONTRAST TECHNIQUE: Multidetector CT imaging of the abdomen and pelvis was performed following the standard protocol without IV contrast. COMPARISON:  Abdominal radiograph 04/27/2017; chest radiograph 04/12/2017. FINDINGS: Lower chest: Heart is enlarged. Prominent subcentimeter inferior mediastinal lymph nodes. Hepatobiliary: Liver is diffusely low in attenuation compatible with steatosis. High attenuation within the gallbladder lumen suggestive of sludge. No intrahepatic or extrahepatic biliary ductal dilatation. Pancreas: No inflammatory changes. Within the region the pancreatic tail there is a 3.2 cm area which has a slightly different morphology within the remainder the pancreatic head and body. Spleen: Unremarkable Adrenals/Urinary Tract: Normal adrenal glands. The left kidney is atrophic. Right kidney is normal in contour. No hydronephrosis. Urinary bladder is unremarkable. Stomach/Bowel: Descending and sigmoid colonic diverticulosis. No CT evidence for acute diverticulitis. Normal appendix. Normal morphology of the stomach. No free fluid or free intraperitoneal air. Vascular/Lymphatic: Normal caliber abdominal aorta. Peripheral calcified atherosclerotic plaque. No retroperitoneal lymphadenopathy. Reproductive: Uterus and adnexal structures are unremarkable. Other: Soft tissue anasarca. Musculoskeletal: Lumbar spine degenerative changes. No aggressive or acute appearing osseous lesions. 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 IMPRESSION: 1. Focal area at the pancreatic tail what she  is nonspecific in etiology on noncontrast enhanced CT and may potentially represent undulation pancreatic tissue, pancreatic tail mass for  splenule. Recommend further evaluation with multiphase contrast-enhanced pancreatic protocol CT. 2. Hepatic steatosis. 3. Atrophic left kidney. 4. Gallbladder sludge 5. These results will be called to the ordering clinician or representative by the Radiologist Assistant, and communication documented in the PACS or zVision Dashboard. Electronically Signed   By: Annia Belt M.D.   On: 01-May-2017 13:46    Labs: BMET Recent Labs  Lab 04/09/17 0209 04/10/17 1610 04/11/17 0833 04/12/17 0406 05-01-17 0200 01-May-2017 0639 05/01/2017 1831 04/14/17 0459 04/15/17 0334  NA 135 136 136 136 141  --  138 138 135  136  K 4.6 4.8 5.0 5.5* 6.1* 6.4* 5.2* 6.2* 5.4*  5.3*  CL 95* 96* 96* 94* 89*  --  92* 89* 93*  93*  CO2 25 22 21* 21* 17*  --  18* 18* 18*  18*  GLUCOSE 114* 88 100* 74 96  --  106* 98 115*  123*  BUN 34* 56* 41* 67* 45*  --  70* 85* 62*  60*  CREATININE 7.73* 10.01* 7.54* 9.03* 6.21*  --  6.96* 7.84* 5.88*  5.96*  CALCIUM 9.5 9.1 9.9 10.4* 11.1*  --  10.2 10.9* 9.9  10.0  PHOS 7.6* 9.2*  --   --   --   --   --   --  10.9*   CBC Recent Labs  Lab 04/12/17 0406 05/01/17 0200 04/14/17 0459 04/15/17 0334  WBC 20.4* 30.0* 27.0* 31.2*  NEUTROABS 15.7* 23.7* 22.1* 26.2*  HGB 8.6* 8.7* 8.5* 8.6*  HCT 27.1* 27.5* 26.6* 27.5*  MCV 92.5 91.4 90.8 92.9  PLT 386 325 329 261    Medications:    . amLODipine  10 mg Oral QHS  . bacitracin   Topical Once  . darbepoetin (ARANESP) injection - DIALYSIS  200 mcg Intravenous Q Thu-HD  . feeding supplement (NEPRO CARB STEADY)  237 mL Oral BID BM  . feeding supplement (PRO-STAT SUGAR FREE 64)  30 mL Oral BID  . gabapentin  300 mg Oral Daily  . heparin subcutaneous  5,000 Units Subcutaneous Q8H  . insulin aspart  0-15 Units Subcutaneous TID WC  . labetalol  200 mg Oral BID  . lanthanum  1,000 mg Oral TID WC  . lidocaine-EPINEPHrine  10 mL Intradermal Once  . multivitamin  1 tablet Oral QHS  . patiromer  8.4 g Oral Daily  . simvastatin   20 mg Oral Daily  . sucroferric oxyhydroxide  1,000 mg Oral TID WC  . vancomycin  125 mg Oral QID      Bufford Buttner MD [facility] pgr [phone] 04/15/2017, 10:05 AM

## 2017-04-15 NOTE — Progress Notes (Signed)
   Discussed with Dr. Signe ColtUpton, at this time will hold on angiogram until she is more stable and goals of care established.   Erick Murin C. Randie Heinzain, MD [facility] Office: [phone] Pager: [phone]

## 2017-04-15 NOTE — Progress Notes (Signed)
PROGRESS NOTE  Roma KayserDiahann Xiang V. WUJ:[id number] DOB: 04/02/1966 DOA: 03/18/2017 PCP: Patient, No Pcp Per  HPI/Recap of past 3424 hours: 51 year old female with history of hypertension, diabetes, hyperlipidemia, CKD, presents with bilateral foot pain associated with lower extremity edema.  Patient was also noted to be C. difficile positive on admission as patient was having diarrhea.  Patient is currently being managed for end-stage renal disease requiring dialysis.  Today, patient still lethargic, has been refusing to eat for the past day.  Responds to questions appropriately, but sleeps off after a few minutes.  Intermittently moaning in pain, reports bilateral lower extremity hurts.  Unable to complete ROS due to her being lethargic. Yesterday night, rapid response was called as pt was noted to be very agitated, sweaty, and was found to be hypoglycemic. Pt was also noted to be hypotensive. Pt was given a fluid bolus as well as dextrose of which she responded. 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 difficile diarrhea   Hypoglycemia   Transaminitis   Palliative care by specialist   #Acute metabolic encephalopathy Unclear etiology Sepsis Vs ?uremia Vs med induced Vs ileus Afebrile, severe leukocytosis Lactic acid trended down, pro calcitonin high Repeat blood culture x2, no growth till date Urine culture pending Chest x-ray unremarkable CT head negative CT abdomen/pelvis showed hepatic steatosis, focal area of the pancreatic tail, nonspecific.  Recommend CT pancreatic protocol Had 1 dose of IV vancomycin and Zosyn, was  discontinued by infectious disease team ID on board, do not think patient is septic, could be ileus, unclear etiology, will monitor, increased oral vancomycin to 500mg   #C. difficile diarrhea Patient no longer having diarrhea for the past 24-48 hours Afebrile, leukocytosis trending upwards ?? 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  Deeper punch biopsy needed to diagnose calciphylaxis Vascular surgery recommending arteriogram, will proceed once patient's condition improves Continue treatment for calciphylaxis during dialysis  #Anemia of chronic kidney disease Continue IV iron and ESA during dialysis  #Type 2 diabetes with hypoglycemia Hypoglycemic episode on 11/3, pt with poor oral intake Hypoglycemic protocol Held insulin regimen  #Hyperlipidemia Continue statin  #Goals of care Pt has a very poor prognosis overall Palliative team on board, spoke to son over the phone Arranged for a meeting with family 11/5   Code Status: Full  Family Communication: None at bedside  Disposition Plan: Home once stable   Consultants:  Nephrologist  Vascular surgeon  General surgeon  ID  Procedures:  AV fistula on 10/25  Antimicrobials:  P.o. Vancomycin  Received 1 dose of IV Zosyn and vancomycin, discontinued by ID on 11/2  DVT prophylaxis: Heparin   Objective: Vitals:   04/14/17 2100 04/14/17 2205 04/15/17 0621 04/15/17 0915  BP: 93/60 (!) 110/43 126/61 (!) 157/75  Pulse:  62 78 87  Resp:  (!) 22 20 18   Temp:  98 F (36.7 C) 98.5 F (36.9 C) 98.2 F (36.8 C)  TempSrc:  Oral Oral Oral  SpO2: 94% 95% 94% 97%  Weight:      Height:        Intake/Output Summary  (Last 24 hours) at 04/15/2017 1803 Last data filed at 04/15/2017 1428 Gross per 24 hour  Intake 120 ml  Output 1400 ml  Net -1280 ml   Filed Weights   04/14/17 1345 04/14/17 1805 04/14/17 1936  Weight: 109.5 kg (241 lb 6.5 oz) 101.5 kg (223 lb 12.3 oz) 101.5 kg (223 lb 12.3 oz)    Exam:   General: Alert, responds to command, lethargic  Cardiovascular: S1-S2 present, no added heart sounds  Respiratory: Chest clear bilaterally  Abdomen: Soft, mild diffuse tenderness, bowel sounds present  Musculoskeletal: No bilateral pedal edema, tender to touch lower extremities, superficial wound on the lower left extremity  Skin: Normal  Psychiatry: Lethargic  Neuro: No focal neurologic deficit   Data Reviewed: CBC: Recent Labs  Lab 04/11/17 0833 04/12/17 0406 2017-05-08 0200 04/14/17 0459 04/15/17 0334  WBC 16.4* 20.4* 30.0* 27.0* 31.2*  NEUTROABS 12.1* 15.7* 23.7* 22.1* 26.2*  HGB 8.3* 8.6* 8.7* 8.5* 8.6*  HCT 26.9* 27.1* 27.5* 26.6* 27.5*  MCV 93.4 92.5 91.4 90.8 92.9  PLT 318 386 325 329 261   Basic Metabolic Panel: Recent Labs  Lab 04/09/17 0209 04/10/17 0808  04/12/17 0406 08-May-2017 0200 08-May-2017 0639 05-08-2017 1831 04/14/17 0459 04/15/17 0334  NA 135 136   < > 136 141  --  138 138 135  136  K 4.6 4.8   < > 5.5* 6.1* 6.4* 5.2* 6.2* 5.4*  5.3*  CL 95* 96*   < > 94* 89*  --  92* 89* 93*  93*  CO2 25 22   < > 21* 17*  --  18* 18* 18*  18*  GLUCOSE 114* 88   < > 74 96  --  106* 98 115*  123*  BUN 34* 56*   < > 67* 45*  --  70* 85* 62*  60*  CREATININE 7.73* 10.01*   < > 9.03* 6.21*  --  6.96* 7.84* 5.88*  5.96*  CALCIUM 9.5 9.1   < > 10.4* 11.1*  --  10.2 10.9* 9.9  10.0  PHOS 7.6* 9.2*  --   --   --   --   --   --  10.9*   < > = values in this interval not displayed.   GFR: Estimated Creatinine Clearance: 13.7 mL/min (A) (by C-G formula based on SCr of 5.88 mg/dL (H)). Liver Function Tests: Recent Labs  Lab 04/09/17 0209 04/10/17 0808 04/15/17 0334    AST  --   --  1,058*  ALT  --   --  266*  ALKPHOS  --   --  136*  BILITOT  --   --  0.9  PROT  --   --  7.4  ALBUMIN 2.8* 2.6* 2.6*  2.5*   No results for input(s): LIPASE, AMYLASE in the last 168 hours. Recent Labs  Lab 04/15/17 0334  AMMONIA 34   Coagulation Profile: Recent Labs  Lab 04/15/17 0334  INR 1.73   Cardiac Enzymes: Recent Labs  Lab 04/09/17 1412  CKTOTAL 515*   BNP (last 3 results) No results for input(s): PROBNP in the last 8760 hours. HbA1C: No results for input(s): HGBA1C in the last 72 hours. CBG: Recent Labs  Lab 04/15/17 0133 04/15/17 0643 04/15/17  0815 04/15/17 1153 04/15/17 1537  GLUCAP 148* 93 116* 136* 141*   Lipid Profile: No results for input(s): CHOL, HDL, LDLCALC, TRIG, CHOLHDL, LDLDIRECT in the last 72 hours. Thyroid Function Tests: No results for input(s): TSH, T4TOTAL, FREET4, T3FREE, THYROIDAB in the last 72 hours. Anemia Panel: No results for input(s): VITAMINB12, FOLATE, FERRITIN, TIBC, IRON, RETICCTPCT in the last 72 hours. Urine analysis:    Component Value Date/Time   COLORURINE YELLOW 04/10/2017 0540   APPEARANCEUR HAZY (A) 04/10/2017 0540   LABSPEC 1.000 (L) 04/10/2017 0540   PHURINE 7.0 04/10/2017 0540   GLUCOSEU NEGATIVE 04/10/2017 0540   HGBUR MODERATE (A) 04/10/2017 0540   BILIRUBINUR NEGATIVE 04/10/2017 0540   KETONESUR NEGATIVE 04/10/2017 0540   PROTEINUR 30 (A) 04/10/2017 0540   UROBILINOGEN 0.2 05/30/2010 1628   NITRITE NEGATIVE 04/10/2017 0540   LEUKOCYTESUR NEGATIVE 04/10/2017 0540   Sepsis Labs: @LABRCNTIP (procalcitonin:4,lacticidven:4)  ) Recent Results (from the past 240 hour(s))  C difficile quick scan w PCR reflex     Status: Abnormal   Collection Time: 04/09/17 11:52 AM  Result Value Ref Range Status   C Diff antigen POSITIVE (A) NEGATIVE Final   C Diff toxin NEGATIVE NEGATIVE Final   C Diff interpretation Results are indeterminate. See PCR results.  Final  Clostridium Difficile by PCR      Status: Abnormal   Collection Time: 04/09/17 11:52 AM  Result Value Ref Range Status   Toxigenic C Difficile by pcr POSITIVE (A) NEGATIVE Final    Comment: Positive for toxigenic C. difficile with little to no toxin production. Only treat if clinical presentation suggests symptomatic illness.  Culture, blood (routine x 2)     Status: None (Preliminary result)   Collection Time: 05/07/2017 10:45 AM  Result Value Ref Range Status   Specimen Description BLOOD RIGHT PICC LINE  Final   Special Requests   Final    BOTTLES DRAWN AEROBIC AND ANAEROBIC Blood Culture results may not be optimal due to an excessive volume of blood received in culture bottles   Culture NO GROWTH 2 DAYS  Final   Report Status PENDING  Incomplete  Culture, blood (routine x 2)     Status: None (Preliminary result)   Collection Time: 04/20/2017  4:00 PM  Result Value Ref Range Status   Specimen Description BLOOD RIGHT HAND  Final   Special Requests IN PEDIATRIC BOTTLE Blood Culture adequate volume  Final   Culture NO GROWTH 2 DAYS  Final   Report Status PENDING  Incomplete      Studies: No results found.  Scheduled Meds: . bacitracin   Topical Once  . darbepoetin (ARANESP) injection - DIALYSIS  200 mcg Intravenous Q Thu-HD  . feeding supplement (NEPRO CARB STEADY)  237 mL Oral BID BM  . feeding supplement (PRO-STAT SUGAR FREE 64)  30 mL Oral BID  . gabapentin  300 mg Oral Daily  . heparin subcutaneous  5,000 Units Subcutaneous Q8H  . lanthanum  1,000 mg Oral TID WC  . lidocaine-EPINEPHrine  10 mL Intradermal Once  . multivitamin  1 tablet Oral QHS  . patiromer  8.4 g Oral Daily  . simvastatin  20 mg Oral Daily  . sucroferric oxyhydroxide  1,000 mg Oral TID WC  . vancomycin  500 mg Oral QID    Continuous Infusions: . ferric gluconate (FERRLECIT/NULECIT) IV Stopped (04/14/17 1638)  . sodium chloride    . sodium thiosulfate infusion for calciphylaxis Stopped (04/14/17 1752)     LOS: 16 days  Briant Cedar, MD [facility]  If 7PM-7AM, please contact night-coverage [email or web address] Password [email or web address] 04/15/2017, 6:03 PM

## 2017-04-16 ENCOUNTER — Inpatient Hospital Stay (HOSPITAL_COMMUNITY): Payer: BLUE CROSS/BLUE SHIELD

## 2017-04-16 ENCOUNTER — Inpatient Hospital Stay (HOSPITAL_COMMUNITY): Payer: BLUE CROSS/BLUE SHIELD | Admitting: Certified Registered"

## 2017-04-16 DIAGNOSIS — Z7189 Other specified counseling: Secondary | ICD-10-CM

## 2017-04-16 DIAGNOSIS — E872 Acidosis, unspecified: Secondary | ICD-10-CM

## 2017-04-16 DIAGNOSIS — L8990 Pressure ulcer of unspecified site, unspecified stage: Secondary | ICD-10-CM

## 2017-04-16 DIAGNOSIS — K8689 Other specified diseases of pancreas: Secondary | ICD-10-CM

## 2017-04-16 DIAGNOSIS — A0472 Enterocolitis due to Clostridium difficile, not specified as recurrent: Secondary | ICD-10-CM

## 2017-04-16 DIAGNOSIS — I469 Cardiac arrest, cause unspecified: Secondary | ICD-10-CM

## 2017-04-16 DIAGNOSIS — J9601 Acute respiratory failure with hypoxia: Secondary | ICD-10-CM

## 2017-04-16 DIAGNOSIS — M79606 Pain in leg, unspecified: Secondary | ICD-10-CM

## 2017-04-16 DIAGNOSIS — E162 Hypoglycemia, unspecified: Secondary | ICD-10-CM

## 2017-04-16 DIAGNOSIS — Z992 Dependence on renal dialysis: Secondary | ICD-10-CM

## 2017-04-16 DIAGNOSIS — R740 Nonspecific elevation of levels of transaminase and lactic acid dehydrogenase [LDH]: Secondary | ICD-10-CM

## 2017-04-16 DIAGNOSIS — Z515 Encounter for palliative care: Secondary | ICD-10-CM

## 2017-04-16 DIAGNOSIS — N186 End stage renal disease: Secondary | ICD-10-CM

## 2017-04-16 LAB — BASIC METABOLIC PANEL
Anion gap: 33 — ABNORMAL HIGH (ref 5–15)
BUN: 112 mg/dL — AB (ref 6–20)
CALCIUM: 11.7 mg/dL — AB (ref 8.9–10.3)
CO2: 13 mmol/L — ABNORMAL LOW (ref 22–32)
Chloride: 93 mmol/L — ABNORMAL LOW (ref 101–111)
Creatinine, Ser: 10.01 mg/dL — ABNORMAL HIGH (ref 0.44–1.00)
GFR calc Af Amer: 5 mL/min — ABNORMAL LOW (ref >60)
GFR, EST NON AFRICAN AMERICAN: 4 mL/min — AB (ref >60)
GLUCOSE: 226 mg/dL — AB (ref 65–99)
Potassium: >7.5 mmol/L (ref 3.5–5.1)
Sodium: 139 mmol/L (ref 135–145)

## 2017-04-16 LAB — GLUCOSE, CAPILLARY
GLUCOSE-CAPILLARY: 136 mg/dL — AB (ref 65–99)
GLUCOSE-CAPILLARY: 243 mg/dL — AB (ref 65–99)
Glucose-Capillary: 125 mg/dL — ABNORMAL HIGH (ref 65–99)
Glucose-Capillary: 140 mg/dL — ABNORMAL HIGH (ref 65–99)
Glucose-Capillary: 140 mg/dL — ABNORMAL HIGH (ref 65–99)
Glucose-Capillary: 190 mg/dL — ABNORMAL HIGH (ref 65–99)
Glucose-Capillary: 93 mg/dL (ref 65–99)

## 2017-04-16 LAB — POCT I-STAT 3, ART BLOOD GAS (G3+)
ACID-BASE DEFICIT: 4 mmol/L — AB (ref 0.0–2.0)
Acid-base deficit: 15 mmol/L — ABNORMAL HIGH (ref 0.0–2.0)
BICARBONATE: 21.9 mmol/L (ref 20.0–28.0)
Bicarbonate: 12.2 mmol/L — ABNORMAL LOW (ref 20.0–28.0)
O2 SAT: 100 %
O2 Saturation: 99 %
PCO2 ART: 31.6 mmHg — AB (ref 32.0–48.0)
PH ART: 7.196 — AB (ref 7.350–7.450)
PO2 ART: 193 mmHg — AB (ref 83.0–108.0)
TCO2: 13 mmol/L — ABNORMAL LOW (ref 22–32)
TCO2: 23 mmol/L (ref 22–32)
pCO2 arterial: 36.1 mmHg (ref 32.0–48.0)
pH, Arterial: 7.379 (ref 7.350–7.450)
pO2, Arterial: 186 mmHg — ABNORMAL HIGH (ref 83.0–108.0)

## 2017-04-16 LAB — CBC WITH DIFFERENTIAL/PLATELET
BASOS ABS: 0.8 10*3/uL — AB (ref 0.0–0.1)
Basophils Relative: 2 %
EOS ABS: 0 10*3/uL (ref 0.0–0.7)
Eosinophils Relative: 0 %
HCT: 31.3 % — ABNORMAL LOW (ref 36.0–46.0)
HEMOGLOBIN: 9.4 g/dL — AB (ref 12.0–15.0)
LYMPHS ABS: 2.3 10*3/uL (ref 0.7–4.0)
Lymphocytes Relative: 6 %
MCH: 29.5 pg (ref 26.0–34.0)
MCHC: 30 g/dL (ref 30.0–36.0)
MCV: 98.1 fL (ref 78.0–100.0)
MONOS PCT: 7 %
Monocytes Absolute: 2.6 10*3/uL — ABNORMAL HIGH (ref 0.1–1.0)
NEUTROS ABS: 32 10*3/uL — AB (ref 1.7–7.7)
Neutrophils Relative %: 85 %
Platelets: 226 10*3/uL (ref 150–400)
RBC: 3.19 MIL/uL — ABNORMAL LOW (ref 3.87–5.11)
RDW: 16.6 % — ABNORMAL HIGH (ref 11.5–15.5)
WBC: 37.7 10*3/uL — ABNORMAL HIGH (ref 4.0–10.5)

## 2017-04-16 LAB — RENAL FUNCTION PANEL
ALBUMIN: 2.2 g/dL — AB (ref 3.5–5.0)
ANION GAP: 28 — AB (ref 5–15)
BUN: 102 mg/dL — AB (ref 6–20)
CO2: 15 mmol/L — ABNORMAL LOW (ref 22–32)
Calcium: 9.9 mg/dL (ref 8.9–10.3)
Chloride: 94 mmol/L — ABNORMAL LOW (ref 101–111)
Creatinine, Ser: 8.32 mg/dL — ABNORMAL HIGH (ref 0.44–1.00)
GFR calc Af Amer: 6 mL/min — ABNORMAL LOW (ref >60)
GFR, EST NON AFRICAN AMERICAN: 5 mL/min — AB (ref >60)
GLUCOSE: 234 mg/dL — AB (ref 65–99)
PHOSPHORUS: 13.2 mg/dL — AB (ref 2.5–4.6)
POTASSIUM: 6.9 mmol/L — AB (ref 3.5–5.1)
Sodium: 137 mmol/L (ref 135–145)

## 2017-04-16 LAB — COMPREHENSIVE METABOLIC PANEL
ALT: 268 U/L — AB (ref 14–54)
AST: 826 U/L — AB (ref 15–41)
Albumin: 2.3 g/dL — ABNORMAL LOW (ref 3.5–5.0)
Alkaline Phosphatase: 161 U/L — ABNORMAL HIGH (ref 38–126)
Anion gap: 31 — ABNORMAL HIGH (ref 5–15)
BUN: 105 mg/dL — AB (ref 6–20)
CHLORIDE: 94 mmol/L — AB (ref 101–111)
CO2: 12 mmol/L — ABNORMAL LOW (ref 22–32)
CREATININE: 8.66 mg/dL — AB (ref 0.44–1.00)
Calcium: 10.3 mg/dL (ref 8.9–10.3)
GFR calc Af Amer: 6 mL/min — ABNORMAL LOW (ref >60)
GFR, EST NON AFRICAN AMERICAN: 5 mL/min — AB (ref >60)
Glucose, Bld: 143 mg/dL — ABNORMAL HIGH (ref 65–99)
Potassium: >7.5 mmol/L (ref 3.5–5.1)
Sodium: 137 mmol/L (ref 135–145)
Total Bilirubin: 1.2 mg/dL (ref 0.3–1.2)
Total Protein: 6.3 g/dL — ABNORMAL LOW (ref 6.5–8.1)

## 2017-04-16 LAB — TROPONIN I: TROPONIN I: 0.1 ng/mL — AB (ref <0.03)

## 2017-04-16 LAB — BLOOD GAS, ARTERIAL
Acid-base deficit: 18.7 mmol/L — ABNORMAL HIGH (ref 0.0–2.0)
Bicarbonate: 10.3 mmol/L — ABNORMAL LOW (ref 20.0–28.0)
FIO2: 100
O2 CONTENT: 15 L/min
O2 SAT: 95.6 %
PCO2 ART: 43.5 mmHg (ref 32.0–48.0)
Patient temperature: 98.6
pH, Arterial: 7.004 — CL (ref 7.350–7.450)
pO2, Arterial: 152 mmHg — ABNORMAL HIGH (ref 83.0–108.0)

## 2017-04-16 LAB — LACTIC ACID, PLASMA: Lactic Acid, Venous: 13.7 mmol/L (ref 0.5–1.9)

## 2017-04-16 LAB — POTASSIUM: POTASSIUM: 5.2 mmol/L — AB (ref 3.5–5.1)

## 2017-04-16 MED ORDER — FENTANYL BOLUS VIA INFUSION
50.0000 ug | INTRAVENOUS | Status: DC | PRN
Start: 2017-04-16 — End: 2017-04-17
  Filled 2017-04-16: qty 50

## 2017-04-16 MED ORDER — SODIUM POLYSTYRENE SULFONATE 15 GM/60ML PO SUSP
30.0000 g | Freq: Once | ORAL | Status: AC
  Administered 2017-04-16: 30 g via ORAL
  Filled 2017-04-16: qty 120

## 2017-04-16 MED ORDER — ORAL CARE MOUTH RINSE
15.0000 mL | Freq: Four times a day (QID) | OROMUCOSAL | Status: DC
  Administered 2017-04-16 – 2017-04-17 (×3): 15 mL via OROMUCOSAL

## 2017-04-16 MED ORDER — PRISMASOL BGK 4/2.5 32-4-2.5 MEQ/L IV SOLN
INTRAVENOUS | Status: DC
  Administered 2017-04-16 – 2017-04-17 (×2): via INTRAVENOUS_CENTRAL
  Filled 2017-04-16 (×5): qty 5000

## 2017-04-16 MED ORDER — INSULIN ASPART 100 UNIT/ML ~~LOC~~ SOLN
0.0000 [IU] | SUBCUTANEOUS | Status: DC
  Administered 2017-04-16: 2 [IU] via SUBCUTANEOUS
  Administered 2017-04-16: 3 [IU] via SUBCUTANEOUS

## 2017-04-16 MED ORDER — HYDROMORPHONE HCL 1 MG/ML IJ SOLN
0.5000 mg | Freq: Once | INTRAMUSCULAR | Status: AC
  Administered 2017-04-16: 0.5 mg via INTRAVENOUS
  Filled 2017-04-16: qty 0.5

## 2017-04-16 MED ORDER — CALCIUM CHLORIDE 10 % IV SOLN
1.0000 g | Freq: Once | INTRAVENOUS | Status: AC
Start: 2017-04-16 — End: 2017-04-16
  Administered 2017-04-16: 1 g via INTRAVENOUS

## 2017-04-16 MED ORDER — STERILE WATER FOR INJECTION IV SOLN
INTRAVENOUS | Status: DC
  Administered 2017-04-16 (×3): via INTRAVENOUS_CENTRAL
  Filled 2017-04-16 (×3): qty 150

## 2017-04-16 MED ORDER — FAMOTIDINE IN NACL 20-0.9 MG/50ML-% IV SOLN
20.0000 mg | INTRAVENOUS | Status: DC
  Administered 2017-04-16: 20 mg via INTRAVENOUS
  Filled 2017-04-16: qty 50

## 2017-04-16 MED ORDER — DEXTROSE 5 % IV SOLN
INTRAVENOUS | Status: DC
  Administered 2017-04-16: 14:00:00 via INTRAVENOUS
  Filled 2017-04-16: qty 50

## 2017-04-16 MED ORDER — HEPARIN SODIUM (PORCINE) 1000 UNIT/ML DIALYSIS
1000.0000 [IU] | INTRAMUSCULAR | Status: DC | PRN
  Filled 2017-04-16: qty 3

## 2017-04-16 MED ORDER — CHLORHEXIDINE GLUCONATE 0.12% ORAL RINSE (MEDLINE KIT)
15.0000 mL | Freq: Two times a day (BID) | OROMUCOSAL | Status: DC
  Administered 2017-04-16 – 2017-04-17 (×2): 15 mL via OROMUCOSAL

## 2017-04-16 MED ORDER — SODIUM BICARBONATE 8.4 % IV SOLN
INTRAVENOUS | Status: DC
  Filled 2017-04-16 (×6): qty 150

## 2017-04-16 MED ORDER — SODIUM CHLORIDE 0.9 % IV SOLN
1.0000 g | Freq: Once | INTRAVENOUS | Status: AC
  Administered 2017-04-16: 1 g via INTRAVENOUS
  Filled 2017-04-16: qty 10

## 2017-04-16 MED ORDER — FENTANYL 2500MCG IN NS 250ML (10MCG/ML) PREMIX INFUSION
25.0000 ug/h | INTRAVENOUS | Status: DC
  Administered 2017-04-16: 50 ug/h via INTRAVENOUS
  Filled 2017-04-16: qty 250

## 2017-04-16 MED ORDER — MIDAZOLAM HCL 2 MG/2ML IJ SOLN
2.0000 mg | INTRAMUSCULAR | Status: DC | PRN
Start: 2017-04-16 — End: 2017-04-17

## 2017-04-16 MED ORDER — CALCIUM CHLORIDE 10 % IV SOLN
INTRAVENOUS | Status: AC
  Filled 2017-04-16: qty 10

## 2017-04-16 MED ORDER — DEXTROSE 5 % IV SOLN
0.0000 ug/min | INTRAVENOUS | Status: DC
  Administered 2017-04-16: 14 ug/min via INTRAVENOUS
  Administered 2017-04-16: 2 ug/min via INTRAVENOUS
  Administered 2017-04-17: 18 ug/min via INTRAVENOUS
  Administered 2017-04-17 (×2): 20 ug/min via INTRAVENOUS
  Filled 2017-04-16 (×5): qty 4

## 2017-04-16 MED ORDER — STERILE WATER FOR INJECTION IV SOLN
INTRAVENOUS | Status: DC
  Administered 2017-04-16 (×2): via INTRAVENOUS_CENTRAL
  Filled 2017-04-16 (×3): qty 150

## 2017-04-16 MED ORDER — MIDAZOLAM HCL 2 MG/2ML IJ SOLN: 2.0000 mg | INTRAMUSCULAR | Status: DC | PRN

## 2017-04-16 MED ORDER — FENTANYL CITRATE (PF) 100 MCG/2ML IJ SOLN
50.0000 ug | Freq: Once | INTRAMUSCULAR | Status: DC
  Filled 2017-04-16: qty 2

## 2017-04-16 MED ORDER — PRISMASOL BGK 0/2.5 32-2.5 MEQ/L IV SOLN
INTRAVENOUS | Status: DC
  Administered 2017-04-16 – 2017-04-17 (×6): via INTRAVENOUS_CENTRAL
  Filled 2017-04-16 (×16): qty 5000

## 2017-04-16 MED ORDER — PRISMASOL BGK 4/2.5 32-4-2.5 MEQ/L IV SOLN
INTRAVENOUS | Status: DC
  Administered 2017-04-16: via INTRAVENOUS_CENTRAL
  Filled 2017-04-16 (×5): qty 5000

## 2017-04-16 MED ORDER — SODIUM CHLORIDE 0.9 % FOR CRRT
INTRAVENOUS_CENTRAL | Status: DC | PRN
  Administered 2017-04-17: 999 mL via INTRAVENOUS_CENTRAL
  Filled 2017-04-16: qty 1000

## 2017-04-16 MED FILL — Medication: Qty: 1 | Status: AC

## 2017-04-16 NOTE — Procedures (Signed)
Arterial Catheter Insertion Procedure Note Teresa KayserDiahann Dalton Teresa Dalton [birth year]  Procedure: Insertion of Arterial Catheter  Indications: Blood pressure monitoring and Frequent blood sampling  Procedure Details Consent: Unable to obtain consent because of altered level of consciousness. Time Out: Verified patient identification, verified procedure, site/side was marked, verified correct patient position, special equipment/implants available, medications/allergies/relevent history reviewed, required imaging and test results available.  Performed  Maximum sterile technique was used including antiseptics, cap, gloves, gown, hand hygiene, mask and sheet. Skin prep: Chlorhexidine; local anesthetic administered 20 gauge catheter was inserted into right radial artery using the Seldinger technique.  Evaluation Blood flow good; BP tracing good. Complications: No apparent complications.  Procedure performed under direct ultrasound guidance for real time vessel cannulation.      Rutherford Guysahul Yaniel Limbaugh, [place] - [facility] Pager: [phone]  or [phone] 04/16/2017, 12:44 PM

## 2017-04-16 NOTE — Progress Notes (Signed)
Pt transported to 2H 17. Report given to RN. Pt belongings returned to 2H 17.

## 2017-04-16 NOTE — Consult Note (Signed)
PULMONARY / CRITICAL CARE MEDICINE   Name: Teresa Dalton MRN: [id number] DOB: March 21, 1966    ADMISSION DATE:  04/12/2017 CONSULTATION DATE:  04/16/17  REFERRING MD:  Sharolyn Douglas  CHIEF COMPLAINT:  Cardiac Arrest  HISTORY OF PRESENT ILLNESS:  Pt is encephelopathic; therefore, this HPI is obtained from chart review. Teresa Dalton is a 51 y.o. female with PMH as outlined below.  She was admitted 2017-04-12 with worsening peripheral edema x several weeks and worsening stage IV renal failure with hyperkalemia, C.diff colitis (with minimal diarrhea) She had seen nephrology in 2015 but then never followed up.  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  She had tunneled catheter placed on 10/20 by IR and was started HD.  She later had AV fistula placed in the left arm on 10/25.  Since admission, she had been continued on HD.  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  Pt opted for continued full code status  Overnight 11/4, rapid response called as pt was very agitated, diaphoretic and hypoglycemic. On 11/5, she was found to be lethargic with hypertension.  She proceeded to have cardiac arrest.  Required 10 minutes of ACLS before ROSC.  She was subsequently transferred to the ICU and PCCM was asked to take over her care.   PAST MEDICAL HISTORY :  She  has a past medical history of Anemia of chronic disease, CKD (chronic kidney disease), stage IV (HCC), ESRD (end stage renal disease) on dialysis (HCC), Hyperlipidemia, Hypertension, Thyroid disease, and Type II diabetes mellitus (HCC).  PAST SURGICAL HISTORY: She  has a past surgical history that includes Cesarean section (1988; 1986); Cystectomy (2001); IR US Guide Vasc Access Right (03/31/2017); IR Fluoro Guide CV Line Right (03/31/2017); and CEPHALIC VEIN THROMBECTOMY LEFT ARM and   BRACHIOCEPHALIC  ARTERIOVENOUS (AV) FISTULA CREATION LEFT ARM (Left, 03/18/2017).  No Known Allergies  No current facility-administered medications on file prior to encounter.    Current Outpatient Medications on File Prior to Encounter  Medication Sig  . acetaminophen (TYLENOL) 325 MG tablet Take 650 mg by mouth every 6 (six) hours as needed for mild pain.  Marland Kitchen amLODipine (NORVASC) 10 MG tablet Take 10 mg by mouth daily.  . cloNIDine (CATAPRES) 0.3 MG tablet Take 1 tablet (0.3 mg total) by mouth 2 (two) times daily.  Marland Kitchen gabapentin (NEURONTIN) 300 MG capsule Take 1 capsule (300 mg total) by mouth 3 (three) times daily.  Marland Kitchen glimepiride (AMARYL) 4 MG tablet Take 1 tablet (4 mg total) by mouth daily with breakfast.  . hydrochlorothiazide (HYDRODIURIL) 25 MG tablet Take 25 mg by mouth daily.   . insulin detemir (LEVEMIR FLEXPEN) 100 UNIT/ML injection Inject 40 Units into the skin 2 (two) times daily. (Patient taking differently: Inject 70 Units into the skin 2 (two) times daily. )  . losartan (COZAAR) 50 MG tablet Take 1 tablet (50 mg total) by mouth daily.  . metoprolol tartrate (LOPRESSOR) 50 MG tablet Take 50 mg by mouth 2 (two) times daily.  . simvastatin (ZOCOR) 20 MG tablet Take 1 tablet (20 mg total) by mouth daily.  . sodium bicarbonate 650 MG tablet Take 1,300 mg by mouth 2 (two) times daily.  . Insulin Pen Needle 31G X 5 MM MISC Use with insulin injection two times a day.  . Multiple Vitamins-Minerals (MULTIPLE VITAMINS/WOMENS PO) Take 1 tablet by mouth daily.  FAMILY HISTORY:  Her indicated that her mother is alive. She indicated that her father is deceased. She indicated that the status of her sister is unknown. She indicated that the status of her brother is unknown. She indicated that both of her sons are alive.   SOCIAL HISTORY: She  reports that she has been smoking cigarettes.  She has a 7.50 pack-year smoking history. she has never used smokeless tobacco. She reports that  she uses drugs. Drug: Marijuana. She reports that she does not drink alcohol.  REVIEW OF SYSTEMS:   Unable to obtain as pt is encephalopathic.  SUBJECTIVE:  On vent, unresponsive.  VITAL SIGNS: BP (!) 148/110 (BP Location: Right Arm)   Pulse 82   Temp 98.2 F (36.8 C) (Oral)   Resp 19   Ht  (1.651 m)   Wt 106.1 kg (233 lb 14.5 oz)   LMP 03/26/2017   SpO2 98%   BMI 38.92 kg/m   HEMODYNAMICS:    VENTILATOR SETTINGS:    INTAKE / OUTPUT: I/O last 3 completed shifts: In: 120 [P.O.:120] Out: 0    PHYSICAL EXAMINATION: General: Adult female, critically ill. Neuro: Sedated, non responsive. HEENT: [place] / AT. MMM. Cardiovascular: RRR, no M/R/G. Lungs: CTAB.  ETT in place. Abdomen: BS x 4, soft.  Tenderness to abdomen prior to intubation. Musculoskeletal: BLE indurated and discolored, no deformities, no edema. Skin: BLE as described above, skin otherwise dry.   LABS:  BMET Recent Labs  Lab 04/14/17 0459 04/15/17 0334 04/16/17 0518  NA 138 135  136 137  K 6.2* 5.4*  5.3* >7.5*  CL 89* 93*  93* 94*  CO2 18* 18*  18* 12*  BUN 85* 62*  60* 105*  CREATININE 7.84* 5.88*  5.96* 8.66*  GLUCOSE 98 115*  123* 143*    Electrolytes Recent Labs  Lab 04/10/17 0808  04/14/17 0459 04/15/17 0334 04/16/17 0518  CALCIUM 9.1   < > 10.9* 9.9  10.0 10.3  PHOS 9.2*  --   --  10.9*  --    < > = values in this interval not displayed.    CBC Recent Labs  Lab 04/16/2017 0200 04/14/17 0459 04/15/17 0334  WBC 30.0* 27.0* 31.2*  HGB 8.7* 8.5* 8.6*  HCT 27.5* 26.6* 27.5*  PLT 325 329 261    Coag's Recent Labs  Lab 04/15/17 0334  APTT 38*  INR 1.73    Sepsis Markers Recent Labs  Lab 04/25/2017 0850 04/19/2017 0908 04/22/2017 1553  LATICACIDVEN  --  5.6* 2.3*  PROCALCITON 43.95  --   --     ABG Recent Labs  Lab 04/11/17 1553 04/16/17 1104  PHART 7.396 7.004*  PCO2ART 35.5 43.5  PO2ART 74.4* 152*    Liver Enzymes Recent Labs  Lab 04/10/17 0808  04/15/17 0334 04/16/17 0518  AST  --  1,058* 826*  ALT  --  266* 268*  ALKPHOS  --  136* 161*  BILITOT  --  0.9 1.2  ALBUMIN 2.6* 2.6*  2.5* 2.3*    Cardiac Enzymes No results for input(s): TROPONINI, PROBNP in the last 168 hours.  Glucose Recent Labs  Lab 04/15/17 1537 04/15/17 2024 04/16/17 0000 04/16/17 0413 04/16/17 0736 04/16/17 1050  GLUCAP 141* 144* 136* 140* 140* 125*    Imaging Dg Abd 2 Views  Result Date: 04/16/2017 CLINICAL DATA:  Abdominal pain, diabetes mellitus, hypertension, end-stage renal disease EXAM: ABDOMEN - 2 VIEW COMPARISON:  CT abdomen and pelvis 04/27/2017 FINDINGS: Medication tablet projects  over RIGHT lower quadrant. Scattered radiodense medication in bowel. No bowel dilatation or bowel wall thickening. Bones appear demineralized. No free intraperitoneal air on LEFT lateral decubitus view. Bones appear demineralized. IMPRESSION: Normal bowel gas pattern. Electronically Signed   By: Ulyses Southward M.D.   On: 04/16/2017 08:40     STUDIES:  Renal US 10/20 > medical renal disease, no hydro, right renal cysts. Tib/Fib XR 10/25 > diffuse subcutaneous edema. CT head 10/31 > no acute process. CT A/P 11/2 > focal area at pancreatic tail, recommend further eval with pancreatic protocol CT, hepatic steatosis, atrophic left kidney, gallbladder sludge.  CULTURES: Blood 10/23 > neg.  11/2 >  C.diff 10/29 > positive  ANTIBIOTICS: Vanc 10/30 > Cefazolin 10/20 > 10/25 Zosyn 11/2 > 11/2  SIGNIFICANT EVENTS: 10/19 > admit. 10/20 > tunneled HD cath placed 10/25 > left arm AVF placed 11/4 > palliative care consult 11/5 > PEA arrest, intubated and transferred to ICU  LINES/TUBES: Right IJ HD cath 10/20 >  ETT 11/5 >  A. Line pending 11/5 >   DISCUSSION: 51 y.o. female with hx CKD stage 4 but no follow up, admitted 10/19 with progressive CKD, hyperkalemia, C.diff.  Found to have progression to ESRD, started on HD via temp cath.  AVF placed 10/25.  Also  found to have severe calciphylaxis for which palliative care was consulted 11/4 > full code to remain. 11/5, had PEA arrest and required 10 minutes of ACLS before ROSC.  ASSESSMENT / PLAN:  PULMONARY A: Respiratory insufficiency - following PEA arrest 11/5, s/p intubation. P:   Full vent support. Wean as able. VAP prevention measures. SBT in AM if able. CXR in AM.  CARDIOVASCULAR A:  PEA arrest - presumed due to hyperkalemia. Hx HTN, HLD. P:  Trend troponin, lactate. Continue hydralazine.  RENAL A:   ESRD - recent progression from stage 4, just started on HD this admission (T/Th/Sat). Hyperkalemia - s/p kayexalate, HCO3. AGMA - uremia + probable lactate. Extensive / severe calciphylaxis - poor prognosis. P:   1g Ca gluconate now. Repeat BMP this PM and again in AM. Might need urgent HD today. BMP in AM.  GASTROINTESTINAL A:   C.diff colitis. ? Pancreatic tail lesion. Transaminitis - ? Shock liver. GI prophylaxis. Nutrition. P:   See ID section. Might need additional pancreatic CT protocol imaging vs ultrasound. Trend LFT's. D/c tylenol, statin. SUP: Famotidine. NPO.   HEMATOLOGIC A:   Anemia - chronic. VTE Prophylaxis. P:  Transfuse for Hgb < 7. SCD's / heparin. CBC in AM.  INFECTIOUS A:   C.diff colitis. P:   Abx as above (vanc).  ID following.  ENDOCRINE A:   Hx DM II. P:   SSI.  NEUROLOGIC A:   Sedation due to mechanical ventilation. P:   Sedation:  Fentanyl gtt / midazolam PRN. RASS goal: 0 to -1. Daily WUA. D/c gabapentin, norco, dialudid, methocarbamol,   Family updated: Two sons updated by Dr. Vassie Loll.  Interdisciplinary Family Meeting v Palliative Care Meeting:  Due by: 04/22/17.  CC time: 45 min.   Rutherford Guys, [place] - [facility] Pager: [phone]  or [phone] 04/16/2017, 11:21 AM

## 2017-04-16 NOTE — Progress Notes (Signed)
Code called on 2W. When RT arrived, CPR had been initiated. Was going to attempt to intubate, but no suction setup. CRNA intubated 7.5, 23 @ lip. Secured with pink tape for transport. Transported to [id number]

## 2017-04-16 NOTE — Progress Notes (Signed)
K >7.5. Dr. Alton RevereEdzenukra made aware. Called Nephrology. Received order for Kayxalate.

## 2017-04-16 NOTE — Progress Notes (Signed)
NP at bedside. Pt unresponsive and no pulse. Code initiated. CPR started.

## 2017-04-16 NOTE — Progress Notes (Signed)
PT Cancellation Note  Patient Details Name: Teresa KayserDiahann Raphael V. MRN: [id number] DOB: 03/12/1966   Cancelled Treatment:    Reason Eval/Treat Not Completed: Patient at procedure or test/unavailable, currently off the floor.   Fabio AsaDevon J Jacquese Cassarino 04/16/2017, 8:17 AM Charlotte Crumbevon Eriberto Felch, PT DPT  Board Certified Neurologic Specialist [phone]

## 2017-04-16 NOTE — Progress Notes (Signed)
[facility]  Date of Admission:  03/18/2017     Total days of antibiotics 8  Vancomycin (oral) 10/29 >>          ASSESSMENT/PLAN  C. Difficile infection - Continue with increased dosage of vancomycin for 2 more days. No recent diarrhea and now with constipation.  Abdominal CT scan completed on 11/2 with no evidence of obstruction of ilieus.   Leukocytosis - Continues to experience leukocytosis with no specific origin and has remained afebrile.  with CT scans of the abdomen showing no evidence of infection. Blood cultures from 11/2 no growth to date.   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 Agree with Palliative Care consult.   Pancreatic tail lesion - CT scan of the pancreas with no significant findings however per radiologist imaging was suboptimal secondary to decreased patient cooperation.There was heterogeneity noted which could not be ruled out secondary to patient motion with ultrasound recommended. Given increased leukocytosis and abdominal pain believe ultrasound of abdomen to be reasonable.   PEA arrest - ROSC achieved by code team and transferred to ICU. Likely multifactorial given multiple medical problems.  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 difficile diarrhea   Hypoglycemia   Transaminitis   Palliative care by specialist   . bacitracin   Topical Once  . darbepoetin (ARANESP) injection - DIALYSIS  200 mcg Intravenous Q Thu-HD  . feeding supplement (NEPRO CARB STEADY)  237 mL Oral BID BM  . feeding supplement (PRO-STAT SUGAR FREE 64)  30 mL Oral BID  . gabapentin  300 mg Oral Daily  . heparin  subcutaneous  5,000 Units Subcutaneous Q8H  . lanthanum  1,000 mg Oral TID WC  . lidocaine-EPINEPHrine  10 mL Intradermal Once  . multivitamin  1 tablet Oral QHS  . patiromer  8.4 g Oral Daily  . sucroferric oxyhydroxide  1,000 mg Oral TID WC  . vancomycin  500 mg Oral QID    SUBJECTIVE: Afebrile overnight with continued luekocytosis. Abdominal x-ray with normal gas patterns. LFT's remain elevated with slight improvement in AST and no change in ALT. Nurse reports no bowel movements since 10/30. Patient lethargic requiring stimulation to open her eyes. She was restless and appeared to be in pain.   Significant Event:   Patient became gradually less responsive while attempting to obtain history. Initially having moments screaming and restlessness. These became fewer and patient became less responsive with agonal respirations noted. A code blue was initiated and chest compressions were started after noting no pulse was present. CPR was continued until arrival of the code team who took over care at that time. It was noted that she did receive IV pain medication prior to my arrival. ROSC was achieved and patient was transferred to the ICU.   No Known Allergies   Review of Systems: Review of Systems  Unable to perform ROS: Acuity of condition      OBJECTIVE: Vitals:   04/15/17 2016 04/16/17 0002 04/16/17 0420 04/16/17 0800  BP: (!) 133/56 (!) 130/45 (!) 149/63 (!) 153/129  Pulse: 80 80 81   Resp: 20 20  19   Temp: 98.2 F (36.8 C) 98.6 F (37 C) 98 F (36.7 C) 98.2 F (36.8 C)  TempSrc: Oral Oral Oral Oral  SpO2: 95% 98% 97% 98%  Weight: 234 lb 9.1 oz (106.4 kg)     Height:       Body mass index is 39.03 kg/m.  Physical Exam  Constitutional: No distress (Painful cries at times).  Cardiovascular: Normal rate, regular rhythm, normal heart sounds and intact distal pulses. Exam reveals no gallop and no friction rub.  No murmur heard. Pulmonary/Chest: Breath sounds normal.  Accessory muscle usage present. Bradypnea noted. She is in respiratory distress. She has no wheezes. She has no rales. She exhibits no tenderness.  Abdominal: Soft. Bowel sounds are normal. She exhibits no mass. There is no tenderness. There is no rebound and no guarding.  Neurological:  Lethargic  Skin: Skin is warm and dry. No rash noted. She is not diaphoretic. No erythema. No pallor.   Physical assessment completed prior to PEA arrest.   Lab Results Lab Results  Component Value Date   WBC 31.2 (H) 04/15/2017   HGB 8.6 (L) 04/15/2017   HCT 27.5 (L) 04/15/2017   MCV 92.9 04/15/2017   PLT 261 04/15/2017    Lab Results  Component Value Date   CREATININE 8.66 (H) 04/16/2017   BUN 105 (H) 04/16/2017   NA 137 04/16/2017   K >7.5 (HH) 04/16/2017   CL 94 (L) 04/16/2017   CO2 12 (L) 04/16/2017    Lab Results  Component Value Date   ALT 268 (H) 04/16/2017   AST 826 (H) 04/16/2017   ALKPHOS 161 (H) 04/16/2017   BILITOT 1.2 04/16/2017     Microbiology: Recent Results (from the past 240 hour(s))  C difficile quick scan w PCR reflex     Status: Abnormal   Collection Time: 04/09/17 11:52 AM  Result Value Ref Range Status   C Diff antigen POSITIVE (A) NEGATIVE Final   C Diff toxin NEGATIVE NEGATIVE Final   C Diff interpretation Results are indeterminate. See PCR results.  Final  Clostridium Difficile by PCR     Status: Abnormal   Collection Time: 04/09/17 11:52 AM  Result Value Ref Range Status   Toxigenic C Difficile by pcr POSITIVE (A) NEGATIVE Final    Comment: Positive for toxigenic C. difficile with little to no toxin production. Only treat if clinical presentation suggests symptomatic illness.  Culture, blood (routine x 2)     Status: None (Preliminary result)   Collection Time: 05-03-17 10:45 AM  Result Value Ref Range Status   Specimen Description BLOOD RIGHT PICC LINE  Final   Special Requests   Final    BOTTLES DRAWN AEROBIC AND ANAEROBIC Blood Culture results may not  be optimal due to an excessive volume of blood received in culture bottles   Culture NO GROWTH 2 DAYS  Final   Report Status PENDING  Incomplete  Culture, blood (routine x 2)     Status: None (Preliminary result)   Collection Time: 05-03-17  4:00 PM  Result Value Ref Range Status   Specimen Description BLOOD RIGHT HAND  Final   Special Requests IN PEDIATRIC BOTTLE Blood Culture adequate volume  Final   Culture NO GROWTH 2 DAYS  Final   Report Status PENDING  Incomplete     Marcos Eke, NP [facility] [phone] Pager [phone] Cell  04/16/2017 9:34 AM

## 2017-04-16 NOTE — Code Documentation (Signed)
CODE BLUE NOTE  Patient Name: Teresa KayserDiahann Shill Dalton.   MRN: [id number]   Date of Birth/ Sex: 1966/03/23 , female      Admission Date: 2016-09-21  Attending Provider: Briant CedarEzenduka, Nkeiruka J, MD  Primary Diagnosis: Acute on chronic kidney failure [facility](HCC)    Indication: Pt was in her usual state of health until this AM, when she was noted to be unresponsive, hypertensive SBP 197, atrial fib HR 150s. Code blue was subsequently called. At the time of arrival on scene, ACLS protocol was underway.    Technical Description:  - CPR performance duration:  10 minutes  - Was defibrillation or cardioversion used? No   - Was external pacer placed? No  - Was patient intubated pre/post CPR? Yes    Medications Administered: Y = Yes; Blank = No Amiodarone    Atropine    Calcium    Epinephrine  y  Lidocaine    Magnesium    Norepinephrine    Phenylephrine    Sodium bicarbonate  y  Vasopressin      Post CPR evaluation:  - Final Status - Was patient successfully resuscitated? Yes - What is current rhythm? Sinus tach - What is current hemodynamic status? 130/80   Miscellaneous Information:  - Labs sent, including: Cbc, abg, bmp, cxr, ekg,   - Primary team notified?  Yes  - Family Notified? Yes  - Additional notes/ transfer status: Transferred to CCM 2H17        Teresa Dalton, Teresa Brier B, MD  04/16/2017, 10:58 AM

## 2017-04-16 NOTE — Progress Notes (Signed)
CRITICAL VALUE ALERT  Critical Value:  Lactic Acid 13  Date & Time Notied:  1455 04/16/17  Provider Notified: Dr. Vassie LollAlva  Orders Received/Actions taken: No new orders given

## 2017-04-16 NOTE — Progress Notes (Signed)
PROGRESS NOTE  Teresa Dalton WUJ:[id number] DOB: 07-29-1965 DOA: 03/16/2017 PCP: Patient, No Pcp Per  HPI/Recap of past 61 hours: 51 year old female with history of hypertension, diabetes, hyperlipidemia, CKD, presents with bilateral foot pain associated with lower extremity edema.  Patient was also noted to be C. difficile positive on admission as patient was having diarrhea.  Patient is currently being managed for end-stage renal disease requiring dialysis.  Today, Pt noted to more alert than usual and was screaming in pain, felt tender to touch all over. Pt was unable to answer any questions, or communicate. Pt was given a dose of IV dilaudid 0.'5mg'$  for pain. Patients K was critical at 7.5, and nephrology was paged immediately for a possible emergent dialysis, given she is a known dialysis patient. A dose of kayexlate was ordered for patient by the nephrology team. RN noted she took all of it. Shortly after, pt went into PEA arrest and achieved ROSC by the code team after about 10 mins and transferred to the ICU for further management.  Pt has a very poor prognosis even prior to this PEA arrest. Spoke with the renal as well as the ID team about her ESRD complicated by calciphylaxis and ID about her very elevated leukocytosis and after multiple work up and many sessions of incomplete dialysis (due to patient in severe pain/agitated), it was all agreed upon that pt needed palliative treatment. An order was placed by the renal team and a meeting was supposed to take place today, prior to the event of the PEA.  Pt is currently in the ICU, and is DNR. Pt is currently getting RRT and is intubated. Further management by ICU/Critical care team.  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 difficile diarrhea   Hypoglycemia   Transaminitis   Palliative care by specialist   Pressure injury of skin   Acidemia   Acute hypoxemic respiratory failure (HCC)   Goals of care, counseling/discussion   Palliative care encounter   #PEA Details as above Likely multifactorial in addition to hyperkalemic state in ESRD ROSC after 25mns of CPR Intubated on 11/5  Management as per renal team  #Acute metabolic encephalopathy Unclear etiology Sepsis Vs ?uremia Vs med induced Vs ileus Afebrile, severe leukocytosis Lactic acid trended down, procalcitonin high Repeat blood culture x2, no growth till date Urine culture, no growth till date Chest x-ray unremarkable CT head negative CT abdomen/pelvis showed hepatic steatosis, focal area of the pancreatic tail, nonspecific.  Recommend CT pancreatic protocol- No pancreatic mass, non-specific findings Had 1 dose of IV vancomycin and Zosyn, was discontinued by infectious disease team ID on board, do not think patient is septic, could be ileus, unclear etiology, will monitor, increased oral vancomycin to '500mg'$   #C. difficile diarrhea Patient no longer having diarrhea Afebrile, leukocytosis trending upwards ?? 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  Deeper punch biopsy needed to diagnose calciphylaxis Vascular surgery  recommending arteriogram, never done as pt condition further deteriorated  Continue treatment for calciphylaxis during dialysis  #Anemia of chronic kidney disease Continue IV iron and ESA during dialysis  #Type 2 diabetes with  hypoglycemia Hypoglycemic episode on 11/3, pt with poor oral intake Hypoglycemic protocol Held insulin regimen  #Hyperlipidemia D/C statin  #Goals of care Pt has a very poor prognosis overall Palliative team on board, currently DNR   Code Status: Full  Family Communication: With son  Disposition Plan: Poor prognosis   Consultants:  Nephrologist  Vascular surgeon  General surgeon  ID  Critical care team  Procedures:  AV fistula on 10/25  Intubation on 11/5  Antimicrobials:  P.o. Vancomycin  Received 1 dose of IV Zosyn and vancomycin, discontinued by ID on 11/2  DVT prophylaxis: Heparin   Objective: Vitals:   04/16/17 1500 04/16/17 1531 04/16/17 1600 04/16/17 1700  BP:  (!) 109/57    Pulse:  75    Resp: (!) 26 (!) 23 (!) 25 (!) 25  Temp: (!) 96.7 F (35.9 C)     TempSrc: Oral     SpO2:  96%    Weight:      Height:        Intake/Output Summary (Last 24 hours) at 04/16/2017 1725 Last data filed at 04/16/2017 1700 Gross per 24 hour  Intake 185.63 ml  Output -6 ml  Net 191.63 ml   Filed Weights   04/14/17 1936 04/15/17 2016 04/16/17 0945  Weight: 101.5 kg (223 lb 12.3 oz) 106.4 kg (234 lb 9.1 oz) 106.1 kg (233 lb 14.5 oz)    Exam:   General: Alert, screaming in pain, lethargic  Cardiovascular: S1-S2 present, no added heart sounds  Respiratory: Chest clear bilaterally  Abdomen: Soft, mild diffuse tenderness, bowel sounds present  Musculoskeletal: No bilateral pedal edema, tender to touch lower extremities, superficial wound on the lower left extremity  Skin: Normal  Psychiatry: Lethargic  Neuro: No focal neurologic deficit   Data Reviewed: CBC: Recent Labs  Lab 04/12/17 0406 05/08/2017 0200 04/14/17 0459 04/15/17 0334 04/16/17 1219  WBC 20.4* 30.0* 27.0* 31.2* 37.7*  NEUTROABS 15.7* 23.7* 22.1* 26.2* 32.0*  HGB 8.6* 8.7* 8.5* 8.6* 9.4*  HCT 27.1* 27.5* 26.6* 27.5* 31.3*  MCV 92.5 91.4 90.8 92.9 98.1  PLT 386 325 329 261  929   Basic Metabolic Panel: Recent Labs  Lab 04/10/17 0808  04/14/17 0459 04/15/17 0334 04/16/17 0518 04/16/17 1219 04/16/17 1600  NA 136   < > 138 135  136 137 139 137  K 4.8   < > 6.2* 5.4*  5.3* >7.5* >7.5* PENDING  CL 96*   < > 89* 93*  93* 94* 93* 94*  CO2 22   < > 18* 18*  18* 12* 13* 15*  GLUCOSE 88   < > 98 115*  123* 143* 226* 234*  BUN 56*   < > 85* 62*  60* 105* 112* 102*  CREATININE 10.01*   < > 7.84* 5.88*  5.96* 8.66* 10.01* 8.32*  CALCIUM 9.1   < > 10.9* 9.9  10.0 10.3 11.7* 9.9  PHOS 9.2*  --   --  10.9*  --   --  PENDING   < > = values in this interval not displayed.   GFR: Estimated Creatinine Clearance: 9.9 mL/min (A) (by C-G formula based on SCr of 8.32 mg/dL (H)). Liver Function Tests: Recent Labs  Lab 04/10/17 0808 04/15/17 0334 04/16/17 0518 04/16/17 1600  AST  --  1,058* 826*  --   ALT  --  266* 268*  --   ALKPHOS  --  136* 161*  --   BILITOT  --  0.9 1.2  --   PROT  --  7.4 6.3*  --   ALBUMIN 2.6* 2.6*  2.5* 2.3* 2.2*   No results for input(s): LIPASE, AMYLASE in the last 168 hours. Recent Labs  Lab 04/15/17 0334  AMMONIA 34   Coagulation Profile: Recent Labs  Lab 04/15/17 0334  INR 1.73   Cardiac Enzymes: Recent Labs  Lab 04/16/17 1219  TROPONINI 0.10*   BNP (last 3 results) No results for input(s): PROBNP in the last 8760 hours. HbA1C: No results for input(s): HGBA1C in the last 72 hours. CBG: Recent Labs  Lab 04/16/17 0000 04/16/17 0413 04/16/17 0736 04/16/17 1050 04/16/17 1514  GLUCAP 136* 140* 140* 125* 243*   Lipid Profile: No results for input(s): CHOL, HDL, LDLCALC, TRIG, CHOLHDL, LDLDIRECT in the last 72 hours. Thyroid Function Tests: No results for input(s): TSH, T4TOTAL, FREET4, T3FREE, THYROIDAB in the last 72 hours. Anemia Panel: No results for input(s): VITAMINB12, FOLATE, FERRITIN, TIBC, IRON, RETICCTPCT in the last 72 hours. Urine analysis:    Component Value Date/Time   COLORURINE YELLOW  04/10/2017 0540   APPEARANCEUR HAZY (A) 04/10/2017 0540   LABSPEC 1.000 (L) 04/10/2017 0540   PHURINE 7.0 04/10/2017 0540   GLUCOSEU NEGATIVE 04/10/2017 0540   HGBUR MODERATE (A) 04/10/2017 0540   BILIRUBINUR NEGATIVE 04/10/2017 [place] 04/10/2017 0540   PROTEINUR 30 (A) 04/10/2017 0540   UROBILINOGEN 0.2 05/30/2010 1628   NITRITE NEGATIVE 04/10/2017 0540   LEUKOCYTESUR NEGATIVE 04/10/2017 0540   Sepsis Labs: '@LABRCNTIP'$ (procalcitonin:4,lacticidven:4)  ) Recent Results (from the past 240 hour(s))  C difficile quick scan w PCR reflex     Status: Abnormal   Collection Time: 04/09/17 11:52 AM  Result Value Ref Range Status   C Diff antigen POSITIVE (A) NEGATIVE Final   C Diff toxin NEGATIVE NEGATIVE Final   C Diff interpretation Results are indeterminate. See PCR results.  Final  Clostridium Difficile by PCR     Status: Abnormal   Collection Time: 04/09/17 11:52 AM  Result Value Ref Range Status   Toxigenic C Difficile by pcr POSITIVE (A) NEGATIVE Final    Comment: Positive for toxigenic C. difficile with little to no toxin production. Only treat if clinical presentation suggests symptomatic illness.  Culture, blood (routine x 2)     Status: None (Preliminary result)   Collection Time: 04/25/2017 10:45 AM  Result Value Ref Range Status   Specimen Description BLOOD RIGHT PICC LINE  Final   Special Requests   Final    BOTTLES DRAWN AEROBIC AND ANAEROBIC Blood Culture results may not be optimal due to an excessive volume of blood received in culture bottles   Culture NO GROWTH 3 DAYS  Final   Report Status PENDING  Incomplete  Culture, blood (routine x 2)     Status: None (Preliminary result)   Collection Time: 04/19/2017  4:00 PM  Result Value Ref Range Status   Specimen Description BLOOD RIGHT HAND  Final   Special Requests IN PEDIATRIC BOTTLE Blood Culture adequate volume  Final   Culture NO GROWTH 3 DAYS  Final   Report Status PENDING  Incomplete       Studies: Dg Chest Port 1 View  Result Date: 04/16/2017 CLINICAL DATA:  Hypoxia EXAM: PORTABLE CHEST 1 VIEW COMPARISON:  April 12, 2017 FINDINGS:  Endotracheal tube tip at the carina. Nasogastric tube tip and side port are below the diaphragm with the side port seen in the stomach. Central catheter tip is in the right atrium slightly beyond the cavoatrial junction. No pneumothorax. There is patchy airspace consolidation in the left lower lobe. Lungs elsewhere are clear. Heart is mildly enlarged with pulmonary vascularity within normal limits. No adenopathy. No evident bone lesions. IMPRESSION: Tube and catheter positions as described without pneumothorax. Note that the endotracheal tube tip is at the carina. Advise withdrawing endotracheal tube approximately 3 cm. Patchy airspace consolidation, likely pneumonia or possibly aspiration left lower lobe. Stable cardiac prominence. These results will be called to the ordering clinician or representative by the Radiologist Assistant, and communication documented in the PACS or zVision Dashboard. Electronically Signed   By: Lowella Grip III M.D.   On: 04/16/2017 11:41   Dg Abd 2 Views  Result Date: 04/16/2017 CLINICAL DATA:  Abdominal pain, diabetes mellitus, hypertension, end-stage renal disease EXAM: ABDOMEN - 2 VIEW COMPARISON:  CT abdomen and pelvis 05/03/2017 FINDINGS: Medication tablet projects over RIGHT lower quadrant. Scattered radiodense medication in bowel. No bowel dilatation or bowel wall thickening. Bones appear demineralized. No free intraperitoneal air on LEFT lateral decubitus view. Bones appear demineralized. IMPRESSION: Normal bowel gas pattern. Electronically Signed   By: Lavonia Dana M.D.   On: 04/16/2017 08:40    Scheduled Meds: . bacitracin   Topical Once  . chlorhexidine gluconate (MEDLINE KIT)  15 mL Mouth Rinse BID  . darbepoetin (ARANESP) injection - DIALYSIS  200 mcg Intravenous Q Thu-HD  . fentaNYL (SUBLIMAZE) injection   50 mcg Intravenous Once  . heparin subcutaneous  5,000 Units Subcutaneous Q8H  . insulin aspart  0-9 Units Subcutaneous Q4H  . lanthanum  1,000 mg Oral TID WC  . lidocaine-EPINEPHrine  10 mL Intradermal Once  . mouth rinse  15 mL Mouth Rinse QID  . multivitamin  1 tablet Oral QHS  . patiromer  8.4 g Oral Daily  . sucroferric oxyhydroxide  1,000 mg Oral TID WC  . vancomycin  500 mg Oral QID    Continuous Infusions: . famotidine (PEPCID) IV    . fentaNYL infusion INTRAVENOUS 50 mcg/hr (04/16/17 1700)  . ferric gluconate (FERRLECIT/NULECIT) IV Stopped (04/14/17 1638)  . norepinephrine (LEVOPHED) Adult infusion 10 mcg/min (04/16/17 1700)  . dialysate (PRISMASATE) 2,000 mL/hr at 04/16/17 1334  .  sodium bicarbonate  infusion 1000 mL    . sodium bicarbonate (isotonic) 1000 mL infusion 125 mL/hr at 04/16/17 1337  . sodium bicarbonate (isotonic) 1000 mL infusion 125 mL/hr at 04/16/17 1324  . sodium chloride    . sodium chloride    . sodium thiosulfate infusion for calciphylaxis Stopped (04/14/17 1752)     LOS: 17 days     Alma Friendly, MD [facility]  If 7PM-7AM, please contact night-coverage [email or web address] Password [email or web address] 04/16/2017, 5:25 PM

## 2017-04-16 NOTE — Anesthesia Procedure Notes (Signed)
Procedure Name: Intubation Date/Time: 04/16/2017 10:56 AM Performed by: Myna Bright, CRNA Pre-anesthesia Checklist: Patient identified, Emergency Drugs available, Suction available and Patient being monitored Patient Re-evaluated:Patient Re-evaluated prior to induction Oxygen Delivery Method: Ambu bag Preoxygenation: Pre-oxygenation with 100% oxygen Ventilation: Mask ventilation without difficulty Laryngoscope Size: Mac and 3 Grade View: Grade II Tube type: Subglottic suction tube Tube size: 7.5 mm Number of attempts: 2 Airway Equipment and Method: Stylet Placement Confirmation: ETT inserted through vocal cords under direct vision,  positive ETCO2 and breath sounds checked- equal and bilateral Secured at: 22 cm Tube secured with: Tape Dental Injury: Teeth and Oropharynx as per pre-operative assessment

## 2017-04-16 NOTE — Progress Notes (Signed)
OT Cancellation Note  Patient Details Name: Roma KayserDiahann Nistler V. MRN: [id number] DOB: 03-31-1966   Cancelled Treatment:    Reason Eval/Treat Not Completed: Medical issues which prohibited therapy .  K+  >7.5.  Will follow up when/if pt medically stable.  Arlan Birks [place], OTR/L [phone]  Jeani HawkingConarpe, Keil Pickering M 04/16/2017, 10:50 AM

## 2017-04-16 NOTE — Transfer of Care (Signed)
Immediate Anesthesia Transfer of Care Note  Patient: Teresa Liana GeroldHamilton V.  Procedure(s) Performed: AN AD HOC INTUBATION  Patient Location: Nursing Unit  Anesthesia Type:Patient unresponsive, no anesthesia   Level of Consciousness: unresponsive and Patient remains intubated per anesthesia plan  Airway & Oxygen Therapy: Patient remains intubated per anesthesia plan and Patient placed on Ventilator (see vital sign flow sheet for setting)  Post-op Assessment: Report given to RN and return of circulation, patient unresponsive   Post vital signs: Reviewed  Last Vitals:  Vitals:   04/16/17 0800 04/16/17 0945  BP: (!) 153/129 (!) 148/110  Pulse:  82  Resp:    Temp: 36.8 C 36.8 C  SpO2: 98% 98%    Last Pain:  Vitals:   04/16/17 0945  TempSrc: Oral  PainSc: 9       Patients Stated Pain Goal: 0 (04/15/17 1551)  Complications: No apparent anesthesia complications

## 2017-04-16 NOTE — Progress Notes (Signed)
Daily Progress Note   Patient Name: Teresa Dalton.       Date: 04/16/2017 DOB: 1965-08-09  Age: 51 y.o. MRN#: [id number] Attending Physician: Alma Friendly, MD Primary Care Physician: Patient, No Pcp Per Admit Date: 03/31/2017  Reason for Consultation/Follow-up: Establishing goals of care, Pain control and Psychosocial/spiritual support  Subjective: Teresa Dalton has just coded. Family planning to gather.   Length of Stay: 17  Current Medications: Scheduled Meds:  . bacitracin   Topical Once  . darbepoetin (ARANESP) injection - DIALYSIS  200 mcg Intravenous Q Thu-HD  . fentaNYL (SUBLIMAZE) injection  50 mcg Intravenous Once  . heparin subcutaneous  5,000 Units Subcutaneous Q8H  . insulin aspart  0-9 Units Subcutaneous Q4H  . lanthanum  1,000 mg Oral TID WC  . lidocaine-EPINEPHrine  10 mL Intradermal Once  . multivitamin  1 tablet Oral QHS  . patiromer  8.4 g Oral Daily  . sucroferric oxyhydroxide  1,000 mg Oral TID WC  . vancomycin  500 mg Oral QID    Continuous Infusions: . calcium gluconate    . famotidine (PEPCID) IV    . fentaNYL infusion INTRAVENOUS    . ferric gluconate (FERRLECIT/NULECIT) IV Stopped (04/14/17 1638)  . norepinephrine (LEVOPHED) Adult infusion    . dialysate (PRISMASATE)    . sodium bicarbonate (isotonic) 1000 mL infusion    . sodium bicarbonate (isotonic) 1000 mL infusion    .  sodium bicarbonate  infusion 1000 mL    . sodium chloride    . sodium chloride    . sodium thiosulfate infusion for calciphylaxis Stopped (04/14/17 1752)    PRN Meds: fentaNYL, heparin, hydrALAZINE, lidocaine (PF), lidocaine, midazolam, midazolam, MUSCLE RUB, ondansetron **OR** ondansetron (ZOFRAN) IV, sodium chloride, sodium chloride  Physical Exam    Constitutional: She appears well-developed. She appears toxic. She is sedated and intubated.  Obese   Cardiovascular: Normal rate and regular rhythm.  Pulmonary/Chest: No accessory muscle usage. No tachypnea. She is intubated. No respiratory distress.  Abdominal: Normal appearance.  Neurological:  Sedated on vent  Nursing note and vitals reviewed.           Vital Signs: BP (!) 92/50   Pulse 70   Temp 98.2 F (36.8 C) (Oral)   Resp 20   Ht 5' 5"  (1.651 m)  Wt 106.1 kg (233 lb 14.5 oz)   LMP 03/26/2017   SpO2 100%   BMI 38.92 kg/m  SpO2: SpO2: 100 % O2 Device: O2 Device: Ventilator O2 Flow Rate: O2 Flow Rate (L/min): 1 L/min  Intake/output summary:   Intake/Output Summary (Last 24 hours) at 04/16/2017 1308 Last data filed at 04/15/2017 2021 Gross per 24 hour  Intake 0 ml  Output 0 ml  Net 0 ml   LBM: Last BM Date: 04/10/17 Baseline Weight: Weight: 90.7 kg (200 lb) Most recent weight: Weight: 106.1 kg (233 lb 14.5 oz)       Palliative Assessment/Data:    Flowsheet Rows     Most Recent Value  Intake Tab  Referral Department  Nephrology  Unit at Time of Referral  Med/Surg Unit  Palliative Care Primary Diagnosis  Nephrology  Date Notified  04/14/17  Palliative Care Type  New Palliative care  Reason for referral  Pain, Clarify Goals of Care  Date of Admission  03/16/2017  Date first seen by Palliative Care  04/15/17  # of days Palliative referral response time  1 Day(s)  # of days IP prior to Palliative referral  15  Clinical Assessment  Palliative Performance Scale Score  30%  Pain Max last 24 hours  Not able to report  Pain Min Last 24 hours  Not able to report  Dyspnea Max Last 24 Hours  Not able to report  Dyspnea Min Last 24 hours  Not able to report  Nausea Max Last 24 Hours  Not able to report  Nausea Min Last 24 Hours  Not able to report  Anxiety Max Last 24 Hours  Not able to report  Anxiety Min Last 24 Hours  Not able to report  Other Max Last 24  Hours  Not able to report  Psychosocial & Spiritual Assessment  Palliative Care Outcomes  Palliative Care follow-up planned  Yes, Facility      Patient Active Problem List   Diagnosis Date Noted  . Pressure injury of skin 04/16/2017  . Acidemia   . Acute hypoxemic respiratory failure ([place])   . Hypoglycemia   . Transaminitis   . Palliative care by specialist   . Calciphylaxis   . C. difficile diarrhea   . Anemia of chronic renal failure   . Leg pain   . ESRD (end stage renal disease) ([place])   . Fever   . Leukocytosis   . Acute on chronic kidney failure ([place]) 03/13/2017  . CKD stage 5 secondary to hypertension ([place]) 04/03/2017  . Vaginal candidiasis 07/21/2011  . Chronic renal insufficiency 03/10/2011  . Tobacco abuse 03/10/2011  . Swelling of eyelid 11/08/2010  . Proteinuria 11/01/2010  . THYROID CYST 08/12/2010  . DM2 (diabetes mellitus, type 2) ([place]) 08/12/2010  . Essential hypertension 08/12/2010    Palliative Care Assessment & Plan   HPI: 51 y.o. female  with past medical history of ESRD on HD, DM, HTN, HLD admitted on 03/16/2017 with LE swelling. She has had a prolonged hospitalization with difficulty with HD, now with calciphylaxis. She has been in severe pain to LE as well as abd. Stool positive for c.diff but no diarrhea. She is being followed by nephrology, as well as ID.  Assessment: I go to visit with Teresa Dalton's sons today and as I arrive on unit a code blue was called for Teresa Dalton. Her son was at bedside and distraught. I updated him that they were able to obtain a pulse and in  process of trying to stabilize her. He stepped outside for fresh air.   Late entry: I came back to visit with family after transition to ICU and met with them along with Dr. Elsworth Soho. Dr. Elsworth Soho explained plan for intubation for 24-48 hours to try and correct potassium, control pain, and optimize her medically. We also discussed calciphylaxis and the pain and prognosis associated with this  and EVEN if we can correct some of her medical issues they will be ongoing problems that placed her in this situation to begin with. Family has good understanding of poor prognosis and severity of disease. They recognize that she is likely at end of life. Plan is to optimize and see if she can be extubated in the next couple days. Son hoping he can "speak to my mother again." However, they are clear that they do not want to see her suffering. Dr. Elsworth Soho addressed code status and family agrees DNR. They need some time to process all of this information. Emotional support provided. I will continue to follow and support.   Recommendations/Plan:  Severe pain abd/LE r/t calciphylaxis: Renal managing lanthanum and sodium thiosulfate. Agree with fentanyl 50 mcg every hour prn while intubated.   Versed prn agitation while on vent.    Code Status:  DNR  Prognosis:   Family understands that prognosis is very poor even if she can successfully come off vent.   Discharge Planning:  To Be Determined  Care plan was discussed with Dr. Elsworth Soho.   Thank you for allowing the Palliative Medicine Team to assist in the care of this patient.   Total Time 60 min Prolonged Time Billed  no       Greater than 50%  of this time was spent counseling and coordinating care related to the above assessment and plan.  Vinie Sill, NP Palliative Medicine Team Pager # [phone] (M-F 8a-5p) Team Phone # [phone] (Nights/Weekends)

## 2017-04-16 NOTE — Progress Notes (Signed)
CRITICAL VALUE ALERT  Critical Value:  Potassium 6.9    Date & Time Notied:  1830 04/16/2017  Provider Notified: Dr. Hyman HopesWebb  Orders Received/Actions taken: Order for one amp of calcium to be given now and repeat potassium level in three hours.

## 2017-04-16 NOTE — Plan of Care (Signed)
  Respiratory: Ability to maintain a clear airway and adequate ventilation will improve 04/16/2017 1939 - Progressing by Marin CommentFrei, Maze Corniel C, RN Note Pt has been started on Pecid for ulcer prevention, oral care has been ordered and initiated, subglottic suctioning is in place and head of bed has been greater than 30 degrees.

## 2017-04-16 NOTE — Progress Notes (Signed)
Patient ID: Teresa Kayseriahann Stauch V., female   DOB: [birth year], 51 y.o.   MRN: [id number] Events noted.  Will not plan arteriogram and will not follow actively.  Please call if we can assist

## 2017-04-16 NOTE — Progress Notes (Signed)
Discussed with family . Situation very sad with this lady having severe calciphylaxis and extreme pain requiring morphine. Hyperkalemia and worsening acidosis suggestive and consistent of tissue damage and necrosis. Prognosis already deemed to be poor and will start CRRT as a means of temporarily correcting the metabolic abnormalities until the remaining family can be gathered.

## 2017-04-16 NOTE — Progress Notes (Signed)
[facility] ROUNDING NOTE   Subjective:   Interval History:  Looks uncomfortable with pain all over   Objective:  Vital signs in last 24 hours:  Temp:  [98 F (36.7 C)-98.6 F (37 C)] 98 F (36.7 C) (11/05 0420) Pulse Rate:  [80-87] 81 (11/05 0420) Resp:  [18-20] 19 (11/05 0420) BP: (130-157)/(45-75) 149/63 (11/05 0420) SpO2:  [95 %-98 %] 97 % (11/05 0420) Weight:  [234 lb 9.1 oz (106.4 kg)] 234 lb 9.1 oz (106.4 kg) (11/04 2016)  Weight change: -13.4 oz (-3.1 kg) Filed Weights   04/14/17 1805 04/14/17 1936 04/15/17 2016  Weight: 223 lb 12.3 oz (101.5 kg) 223 lb 12.3 oz (101.5 kg) 234 lb 9.1 oz (106.4 kg)    Intake/Output: I/O last 3 completed shifts: In: 120 [P.O.:120] Out: 0    Intake/Output this shift:  No intake/output data recorded. Gen: lying in bed, uncomfortable, clearly in pain HEENT: EOMI, sclerae anicteric CVS: regular rate and rhythm. Resp: clear bilaterally. Abd: soft, pannus exquisitely tender this AM, obese, bowel sounds quiet. Ext: no LE edema with multiple dark violaceous and black eschar, indurated, painful nodules on bilateral shins/ calves with  R > L and necrosis ACCESS: New aVF LUA, PC R IJ     Basic Metabolic Panel: Recent Labs  Lab 04/10/17 0808  04/12/17 0406 04/18/2017 0200 04/21/2017 0639 04/15/2017 1831 04/14/17 0459 04/15/17 0334  NA 136   < > 136 141  --  138 138 135  136  K 4.8   < > 5.5* 6.1* 6.4* 5.2* 6.2* 5.4*  5.3*  CL 96*   < > 94* 89*  --  92* 89* 93*  93*  CO2 22   < > 21* 17*  --  18* 18* 18*  18*  GLUCOSE 88   < > 74 96  --  106* 98 115*  123*  BUN 56*   < > 67* 45*  --  70* 85* 62*  60*  CREATININE 10.01*   < > 9.03* 6.21*  --  6.96* 7.84* 5.88*  5.96*  CALCIUM 9.1   < > 10.4* 11.1*  --  10.2 10.9* 9.9  10.0  PHOS 9.2*  --   --   --   --   --   --  10.9*   < > = values in this interval not displayed.    Liver Function Tests: Recent Labs  Lab 04/10/17 0808 04/15/17 0334  AST  --  1,058*  ALT   --  266*  ALKPHOS  --  136*  BILITOT  --  0.9  PROT  --  7.4  ALBUMIN 2.6* 2.6*  2.5*   No results for input(s): LIPASE, AMYLASE in the last 168 hours. Recent Labs  Lab 04/15/17 0334  AMMONIA 34    CBC: Recent Labs  Lab 04/11/17 0833 04/12/17 0406 04/12/2017 0200 04/14/17 0459 04/15/17 0334  WBC 16.4* 20.4* 30.0* 27.0* 31.2*  NEUTROABS 12.1* 15.7* 23.7* 22.1* 26.2*  HGB 8.3* 8.6* 8.7* 8.5* 8.6*  HCT 26.9* 27.1* 27.5* 26.6* 27.5*  MCV 93.4 92.5 91.4 90.8 92.9  PLT 318 386 325 329 261    Cardiac Enzymes: Recent Labs  Lab 04/09/17 1412  CKTOTAL 515*    BNP: Invalid input(s): POCBNP  CBG: Recent Labs  Lab 04/15/17 1153 04/15/17 1537 04/15/17 2024 04/16/17 0000 04/16/17 0413  GLUCAP 136* 141* 144* 136* 140*    Microbiology: Results for orders placed or performed during the hospital encounter of 04/04/2017  MRSA PCR Screening     Status: None   Collection Time: 04/01/17  2:45 PM  Result Value Ref Range Status   MRSA by PCR NEGATIVE NEGATIVE Final    Comment:        The GeneXpert MRSA Assay (FDA approved for NASAL specimens only), is one component of a comprehensive MRSA colonization surveillance program. It is not intended to diagnose MRSA infection nor to guide or monitor treatment for MRSA infections.   Culture, blood (Routine X 2) Dalton Reflex to ID Panel     Status: None   Collection Time: 04/03/17  7:01 PM  Result Value Ref Range Status   Specimen Description BLOOD LEFT WRIST  Final   Special Requests   Final    BOTTLES DRAWN AEROBIC AND ANAEROBIC Blood Culture adequate volume   Culture NO GROWTH 5 DAYS  Final   Report Status 04/08/2017 FINAL  Final  Culture, blood (Routine X 2) Dalton Reflex to ID Panel     Status: None   Collection Time: 04/03/17  7:01 PM  Result Value Ref Range Status   Specimen Description BLOOD RIGHT ANTECUBITAL  Final   Special Requests   Final    BOTTLES DRAWN AEROBIC AND ANAEROBIC Blood Culture results may not be optimal due  to an excessive volume of blood received in culture bottles   Culture NO GROWTH 5 DAYS  Final   Report Status 04/08/2017 FINAL  Final  Surgical pcr screen     Status: None   Collection Time: 05/05/2017  7:43 AM  Result Value Ref Range Status   MRSA, PCR NEGATIVE NEGATIVE Final   Staphylococcus aureus NEGATIVE NEGATIVE Final    Comment: (NOTE) The Xpert SA Assay (FDA approved for NASAL specimens in patients 49 years of age and older), is one component of a comprehensive surveillance program. It is not intended to diagnose infection nor to guide or monitor treatment.   C difficile quick scan Dalton PCR reflex     Status: Abnormal   Collection Time: 04/09/17 11:52 AM  Result Value Ref Range Status   C Diff antigen POSITIVE (A) NEGATIVE Final   C Diff toxin NEGATIVE NEGATIVE Final   C Diff interpretation Results are indeterminate. See PCR results.  Final  Clostridium Difficile by PCR     Status: Abnormal   Collection Time: 04/09/17 11:52 AM  Result Value Ref Range Status   Toxigenic C Difficile by pcr POSITIVE (A) NEGATIVE Final    Comment: Positive for toxigenic C. difficile with little to no toxin production. Only treat if clinical presentation suggests symptomatic illness.  Culture, blood (routine x 2)     Status: None (Preliminary result)   Collection Time: 05/08/2017 10:45 AM  Result Value Ref Range Status   Specimen Description BLOOD RIGHT PICC LINE  Final   Special Requests   Final    BOTTLES DRAWN AEROBIC AND ANAEROBIC Blood Culture results may not be optimal due to an excessive volume of blood received in culture bottles   Culture NO GROWTH 2 DAYS  Final   Report Status PENDING  Incomplete  Culture, blood (routine x 2)     Status: None (Preliminary result)   Collection Time: 04/12/2017  4:00 PM  Result Value Ref Range Status   Specimen Description BLOOD RIGHT HAND  Final   Special Requests IN PEDIATRIC BOTTLE Blood Culture adequate volume  Final   Culture NO GROWTH 2 DAYS  Final    Report Status PENDING  Incomplete  Coagulation Studies: Recent Labs    04/15/17 0334  LABPROT 20.1*  INR 1.73    Urinalysis: No results for input(s): COLORURINE, LABSPEC, PHURINE, GLUCOSEU, HGBUR, BILIRUBINUR, KETONESUR, PROTEINUR, UROBILINOGEN, NITRITE, LEUKOCYTESUR in the last 72 hours.  Invalid input(s): APPERANCEUR    Imaging: Ct Pancreas Abd Dalton/wo  Result Date: 04/15/2017 CLINICAL DATA:  Possible pancreatic tail lesion on recent noncontrast CT, for further workup. EXAM: CT ABDOMEN WITHOUT AND WITH CONTRAST TECHNIQUE: Multidetector CT imaging of the abdomen was performed following the standard protocol before and following the bolus administration of intravenous contrast. CONTRAST:  100mL ISOVUE-300 IOPAMIDOL (ISOVUE-300) INJECTION 61% COMPARISON:  01/04/17 FINDINGS: The patient was not able to cooperate with imaging and rolled back and forth, and could not leave her arms stationary. This is a severely suboptimal situation for trying to scan for subtle pancreatic abnormalities, and adversely affects diagnostic sensitivity and specificity. Lower chest: Mild lingular atelectasis or scarring. Hepatobiliary: On portal venous phase images there is seen to be bands of indistinct abnormal hypodensity in the dome of the right hepatic lobe for example on images 39 through 63 of series 3. I am uncertain whether these relate to severe motion artifact course the indicate a true underlying pathologic process. These are not readily visible on the precontrast and arterial phase images. The delayed phase images do not extend this far out in the liver. The gallbladder appears unremarkable. No biliary dilatation appreciated. Pancreas: The area of concern along the tip of the tail the pancreas did demonstrates enhancement similar to that of the rest of the pancreas on all phases, and probably simply represents a an unusual beak like tip of the pancreatic tail it is not hypo enhance relative to the rest of  the pancreas to further suggest pancreatic adenocarcinoma. There is no dilatation of the dorsal pancreatic duct. Spleen: Unremarkable Adrenals/Urinary Tract: 1.7 by 1.5 cm lesion in the right kidney upper pole compatible with cyst and shown on previous ultrasound. A smaller 1.3 by 1.0 cm hypodense lesion in the right kidney lower pole is compatible with a cyst. There is a tiny renal form structure in the expected location of the left kidney, 2.8 cm in length, favoring a severely dysplastic/atrophic left kidney. Stomach/Bowel: Unremarkable Vascular/Lymphatic: Aortoiliac atherosclerotic vascular disease. Mildly prominent peripancreatic lymph node at 1.1 cm in short axis on image 55/10. Other: No supplemental non-categorized findings. Musculoskeletal: Laxity of the abdominal wall musculature. Linear high density in the subcutaneous stranding along the margins of the lateral abdominal wall musculature, possibly from some faint dystrophic calcification, bilaterally symmetric. Lower thoracic spondylosis. IMPRESSION: 1. The tip of the tail the pancreas does not enhance differently from the rest of the pancreas, and no pancreatic mass is identified. 2. On the portal venous phase images only there is heterogeneity along the dome of the right hepatic lobe. This is probably due to the severe motion artifact. If the patient has abnormal liver enzymes then further investigation perhaps by ultrasound may be warranted. 3. Severely dysplastic/atrophic left kidney, nonfunctional. Several cysts in the right kidney. 4. Faint irregular linear densities along the subcutaneous edema adjacent to the external oblique muscles, probably representing dystrophic calcifications. 5.  Aortic Atherosclerosis (ICD10-I70.0). 6. Mildly prominent peripancreatic lymph node at 1.1 cm in short axis, nonspecific. 7. Laxity of the abdominal wall musculature. Electronically Signed   By: Gaylyn RongWalter  Liebkemann M.D.   On: 04/15/2017 15:54     Medications:    . ferric gluconate (FERRLECIT/NULECIT) IV Stopped (04/14/17 1638)  . sodium chloride    .  sodium thiosulfate infusion for calciphylaxis Stopped (04/14/17 1752)   . bacitracin   Topical Once  . darbepoetin (ARANESP) injection - DIALYSIS  200 mcg Intravenous Q Thu-HD  . feeding supplement (NEPRO CARB STEADY)  237 mL Oral BID BM  . feeding supplement (PRO-STAT SUGAR FREE 64)  30 mL Oral BID  . gabapentin  300 mg Oral Daily  . heparin subcutaneous  5,000 Units Subcutaneous Q8H  . lanthanum  1,000 mg Oral TID WC  . lidocaine-EPINEPHrine  10 mL Intradermal Once  . multivitamin  1 tablet Oral QHS  . patiromer  8.4 g Oral Daily  . simvastatin  20 mg Oral Daily  . sucroferric oxyhydroxide  1,000 mg Oral TID WC  . vancomycin  500 mg Oral QID   acetaminophen **OR** acetaminophen, fentaNYL (SUBLIMAZE) injection, hydrALAZINE, HYDROcodone-acetaminophen, lidocaine (PF), lidocaine, methocarbamol, MUSCLE RUB, ondansetron **OR** ondansetron (ZOFRAN) IV, sodium chloride  Assessment/ Plan:  1. Leukocytosis: worsening.  LFTs up too.  CT without abscess 11/2, CT panc pending.  She appears diaphoretic with some shock and hypotension. This may indicate some shock liver . I would also stop the statin at this point   2. ESRD- TTS schedule.  CLIP'd to [facility] when ready for d/c but there are multiple barriers. 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 started Na thiosulfate (10/30-) TIW with dialysis.  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    pall care c/s to set goals of care involved .  She has been unable to tolerate some dialysis treatments due to pain and I am beginning to doubt the utility of long-term dialysis in this pt.  I agree with Dr Signe Colt and think that the prognosis is grim  4. Peripheral arterial disease: angio rescheduled 5. HTN - continue amlodipine 6. Anemia - cont ESA and IV iron with  HD 7. Mineral metabolic disorder - continue lanthanum, added Velphoro 8. DM - per primary 9. Obesity - will need continued encouragement  10. Hyperlipidemia-  Stop simvastatin 11. C diff: on po vanc 12. Dispo: pending.    LOS: 17 Teresa Dalton @TODAY @7 :58 AM

## 2017-04-16 NOTE — Progress Notes (Signed)
CRITICAL VALUE ALERT  Critical Value:  Lactic Acid 13  Date & Time Notied:  04/16/2017 1500  Provider Notified: Dr. Vassie LollAlva  Orders Received/Actions taken: No new orders given

## 2017-04-17 ENCOUNTER — Inpatient Hospital Stay (HOSPITAL_COMMUNITY): Payer: BLUE CROSS/BLUE SHIELD

## 2017-04-17 DIAGNOSIS — I469 Cardiac arrest, cause unspecified: Secondary | ICD-10-CM

## 2017-04-17 DIAGNOSIS — E875 Hyperkalemia: Secondary | ICD-10-CM

## 2017-04-17 DIAGNOSIS — J9601 Acute respiratory failure with hypoxia: Secondary | ICD-10-CM

## 2017-04-17 LAB — CBC
HCT: 30.4 % — ABNORMAL LOW (ref 36.0–46.0)
HEMOGLOBIN: 9.1 g/dL — AB (ref 12.0–15.0)
MCH: 29.3 pg (ref 26.0–34.0)
MCHC: 29.9 g/dL — ABNORMAL LOW (ref 30.0–36.0)
MCV: 97.7 fL (ref 78.0–100.0)
PLATELETS: 179 10*3/uL (ref 150–400)
RBC: 3.11 MIL/uL — AB (ref 3.87–5.11)
RDW: 16.9 % — ABNORMAL HIGH (ref 11.5–15.5)
WBC: 39.3 10*3/uL — AB (ref 4.0–10.5)

## 2017-04-17 LAB — POCT I-STAT 3, ART BLOOD GAS (G3+)
Acid-base deficit: 9 mmol/L — ABNORMAL HIGH (ref 0.0–2.0)
BICARBONATE: 16.5 mmol/L — AB (ref 20.0–28.0)
O2 Saturation: 95 %
PO2 ART: 83 mmHg (ref 83.0–108.0)
Patient temperature: 98.1
TCO2: 17 mmol/L — AB (ref 22–32)
pCO2 arterial: 33.6 mmHg (ref 32.0–48.0)
pH, Arterial: 7.297 — ABNORMAL LOW (ref 7.350–7.450)

## 2017-04-17 LAB — HEPATIC FUNCTION PANEL
ALBUMIN: 2.2 g/dL — AB (ref 3.5–5.0)
ALT: 443 U/L — ABNORMAL HIGH (ref 14–54)
AST: 1787 U/L — ABNORMAL HIGH (ref 15–41)
Alkaline Phosphatase: 175 U/L — ABNORMAL HIGH (ref 38–126)
BILIRUBIN TOTAL: 1.4 mg/dL — AB (ref 0.3–1.2)
Bilirubin, Direct: 0.5 mg/dL (ref 0.1–0.5)
Indirect Bilirubin: 0.9 mg/dL (ref 0.3–0.9)
Total Protein: 6.9 g/dL (ref 6.5–8.1)

## 2017-04-17 LAB — PHOSPHORUS: Phosphorus: 10.6 mg/dL — ABNORMAL HIGH (ref 2.5–4.6)

## 2017-04-17 LAB — BASIC METABOLIC PANEL
ANION GAP: 24 — AB (ref 5–15)
BUN: 64 mg/dL — AB (ref 6–20)
CHLORIDE: 96 mmol/L — AB (ref 101–111)
CO2: 16 mmol/L — ABNORMAL LOW (ref 22–32)
Calcium: 10.9 mg/dL — ABNORMAL HIGH (ref 8.9–10.3)
Creatinine, Ser: 5.32 mg/dL — ABNORMAL HIGH (ref 0.44–1.00)
GFR, EST AFRICAN AMERICAN: 10 mL/min — AB (ref >60)
GFR, EST NON AFRICAN AMERICAN: 9 mL/min — AB (ref >60)
Glucose, Bld: 55 mg/dL — ABNORMAL LOW (ref 65–99)
POTASSIUM: 5.3 mmol/L — AB (ref 3.5–5.1)
SODIUM: 136 mmol/L (ref 135–145)

## 2017-04-17 LAB — GLUCOSE, CAPILLARY
GLUCOSE-CAPILLARY: 191 mg/dL — AB (ref 65–99)
GLUCOSE-CAPILLARY: 31 mg/dL — AB (ref 65–99)
Glucose-Capillary: 53 mg/dL — ABNORMAL LOW (ref 65–99)
Glucose-Capillary: 131 mg/dL — ABNORMAL HIGH (ref 65–99)

## 2017-04-17 LAB — MAGNESIUM: MAGNESIUM: 2.7 mg/dL — AB (ref 1.7–2.4)

## 2017-04-17 MED ORDER — SODIUM BICARBONATE 8.4 % IV SOLN
50.0000 meq | Freq: Once | INTRAVENOUS | Status: AC
  Administered 2017-04-17: 50 meq via INTRAVENOUS

## 2017-04-17 MED ORDER — DEXTROSE 50 % IV SOLN
INTRAVENOUS | Status: AC
  Administered 2017-04-17: 50 mL via INTRAVENOUS
  Filled 2017-04-17: qty 50

## 2017-04-17 MED ORDER — DEXTROSE 50 % IV SOLN
50.0000 mL | Freq: Once | INTRAVENOUS | Status: AC
  Administered 2017-04-17: 50 mL via INTRAVENOUS

## 2017-04-17 MED ORDER — CALCIUM CHLORIDE 10 % IV SOLN
INTRAVENOUS | Status: AC
  Administered 2017-04-17: 1000 mg via INTRAMUSCULAR
  Filled 2017-04-17: qty 10

## 2017-04-17 MED ORDER — SODIUM CHLORIDE 0.9% FLUSH: 10.0000 mL | INTRAVENOUS | Status: DC | PRN

## 2017-04-17 MED ORDER — SODIUM BICARBONATE 8.4 % IV SOLN
INTRAVENOUS | Status: AC
  Administered 2017-04-17: 50 meq via INTRAVENOUS
  Filled 2017-04-17: qty 50

## 2017-04-17 MED ORDER — DEXTROSE 50 % IV SOLN
INTRAVENOUS | Status: AC
  Administered 2017-04-17: 100 mL
  Filled 2017-04-17: qty 100

## 2017-04-17 MED ORDER — CHLORHEXIDINE GLUCONATE CLOTH 2 % EX PADS: 6.0000 | MEDICATED_PAD | Freq: Every day | CUTANEOUS | Status: DC

## 2017-04-17 MED ORDER — SODIUM CHLORIDE 0.9% FLUSH: 10.0000 mL | Freq: Two times a day (BID) | INTRAVENOUS | Status: DC

## 2017-04-17 MED ORDER — SODIUM CHLORIDE 0.9 % IV SOLN
1.0000 g | Freq: Once | INTRAVENOUS | Status: AC
  Filled 2017-04-17: qty 10

## 2017-04-17 MED ORDER — DEXTROSE 10 % IV SOLN
INTRAVENOUS | Status: DC
Start: 2017-04-17 — End: 2017-04-17
  Administered 2017-04-17: 50 mL via INTRAVENOUS

## 2017-04-18 LAB — CULTURE, BLOOD (ROUTINE X 2)
Culture: NO GROWTH
Culture: NO GROWTH
Special Requests: ADEQUATE

## 2017-04-24 ENCOUNTER — Telehealth: Payer: Self-pay

## 2017-04-24 NOTE — Telephone Encounter (Signed)
On 04/24/17 [facility] [employer] (faxed). The d/c is for cremation. The patient is a patient of Doctor Vassie Lolllva. The d/c will be taken to Pulmonary Unit @ [facility] for signature.  On 04/25/17 I received the d/c back from Doctor Vassie LollAlva. I got the d/c ready and faxed the d/c to the funeral home per the funeral home request.

## 2017-04-25 ENCOUNTER — Telehealth: Payer: Self-pay | Admitting: Pulmonary Disease

## 2017-04-25 NOTE — Telephone Encounter (Signed)
Per Oneta Rackherina, Anita has been given death certificate.  Marcelino DusterMichelle is aware and voiced her understanding.  Nothing further needed.

## 2017-05-07 ENCOUNTER — Telehealth: Payer: Self-pay

## 2017-05-07 NOTE — Telephone Encounter (Signed)
On 05/07/17 I received a d/c from [employer] [facility] (original). The d/c is for cremation. The patient is a patient of Doctor Vassie Lolllva. The d/c will be taken to Pulmonary Unit @ [facility] this pm for signature.  On 05/08/17 I received the d/c back from Doctor Vassie LollAlva. I got the d/c ready and called the funeral home to let them know that Alona BeneJoyce with [employer] will be by to pickup the d/c..Marland Kitchen

## 2017-05-12 NOTE — Progress Notes (Signed)
Dr. Vassie LollAlva to bedside and updated patients son.  Glucose 31 given 2 amps of dextrose per verbal order.  Will continue to monitor closely.

## 2017-05-12 NOTE — Progress Notes (Signed)
[facility] ROUNDING NOTE   Subjective:   Interval History: increased pressor requirements this morning  -- prognosis grim  Family on way in to hospital   Objective:  Vital signs in last 24 hours:  Temp:  [94.3 F (34.6 C)-98.2 F (36.8 C)] 96.8 F (36 C) (11/06 0700) Pulse Rate:  [70-143] 77 (11/06 0327) Resp:  [17-26] 17 (11/06 0700) BP: (83-197)/(39-129) 90/39 (11/06 0327) SpO2:  [96 %-100 %] 100 % (11/06 0400) Arterial Line BP: (64-129)/(32-53) 64/32 (11/06 0700) FiO2 (%):  [40 %-100 %] 40 % (11/06 0400) Weight:  [233 lb 14.5 oz (106.1 kg)-238 lb 8.6 oz (108.2 kg)] 238 lb 8.6 oz (108.2 kg) (11/06 0545)  Weight change: -10.6 oz (-0.3 kg) Filed Weights   04/15/17 2016 04/16/17 0945 Apr 27, 2017 0545  Weight: 234 lb 9.1 oz (106.4 kg) 233 lb 14.5 oz (106.1 kg) 238 lb 8.6 oz (108.2 kg)    Intake/Output: I/O last 3 completed shifts: In: 1375.6 [I.V.:955.6; Other:100; NG/GT:120; IV Piggyback:200] Out: 500 [Emesis/NG output:150; Other:350]   Intake/Output this shift:  No intake/output data recorded.  No intake/output data recorded. Gen: intubated hypotensive and not responsive HEENT: EOMI, sclerae anicteric CVS: regular rate and rhythm. Intubated  Resp  Rhonchi  Abd: soft,pannus exquisitely tender this AM,obese, bowel sounds quiet. Ext: no LE edema with multiple dark violaceous and black eschar, indurated, painful nodules on bilateral shins/ calves with R >L and necrosis ACCESS: New aVF LUA, PC R IJ     Basic Metabolic Panel: Recent Labs  Lab 04/15/17 0334 04/16/17 0518 04/16/17 1219 04/16/17 1600 04/16/17 2136 27-Apr-2017 0337  NA 135  136 137 139 137  --  136  K 5.4*  5.3* >7.5* >7.5* 6.9* 5.2* 5.3*  CL 93*  93* 94* 93* 94*  --  96*  CO2 18*  18* 12* 13* 15*  --  16*  GLUCOSE 115*  123* 143* 226* 234*  --  55*  BUN 62*  60* 105* 112* 102*  --  64*  CREATININE 5.88*  5.96* 8.66* 10.01* 8.32*  --  5.32*  CALCIUM 9.9  10.0 10.3 11.7* 9.9   --  10.9*  MG  --   --   --   --   --  2.7*  PHOS 10.9*  --   --  13.2*  --  10.6*    Liver Function Tests: Recent Labs  Lab 04/15/17 0334 04/16/17 0518 04/16/17 1600 Apr 27, 2017 0337  AST 1,058* 826*  --  1,787*  ALT 266* 268*  --  443*  ALKPHOS 136* 161*  --  175*  BILITOT 0.9 1.2  --  1.4*  PROT 7.4 6.3*  --  6.9  ALBUMIN 2.6*  2.5* 2.3* 2.2* 2.2*   No results for input(s): LIPASE, AMYLASE in the last 168 hours. Recent Labs  Lab 04/15/17 0334  AMMONIA 34    CBC: Recent Labs  Lab 04/12/17 0406 05/10/2017 0200 04/14/17 0459 04/15/17 0334 04/16/17 1219 04-27-17 0337  WBC 20.4* 30.0* 27.0* 31.2* 37.7* 39.3*  NEUTROABS 15.7* 23.7* 22.1* 26.2* 32.0*  --   HGB 8.6* 8.7* 8.5* 8.6* 9.4* 9.1*  HCT 27.1* 27.5* 26.6* 27.5* 31.3* 30.4*  MCV 92.5 91.4 90.8 92.9 98.1 97.7  PLT 386 325 329 261 226 179    Cardiac Enzymes: Recent Labs  Lab 04/16/17 1219  TROPONINI 0.10*    BNP: Invalid input(s): POCBNP  CBG: Recent Labs  Lab 04/16/17 1514 04/16/17 2005 04/16/17 2350 04/27/17 0347 2017/04/27 0405  GLUCAP 243*  190* 93 43* 191*    Microbiology: Results for orders placed or performed during the hospital encounter of 03/19/2017  MRSA PCR Screening     Status: None   Collection Time: 04/01/17  2:45 PM  Result Value Ref Range Status   MRSA by PCR NEGATIVE NEGATIVE Final    Comment:        The GeneXpert MRSA Assay (FDA approved for NASAL specimens only), is one component of a comprehensive MRSA colonization surveillance program. It is not intended to diagnose MRSA infection nor to guide or monitor treatment for MRSA infections.   Culture, blood (Routine X 2) w Reflex to ID Panel     Status: None   Collection Time: 04/03/17  7:01 PM  Result Value Ref Range Status   Specimen Description BLOOD LEFT WRIST  Final   Special Requests   Final    BOTTLES DRAWN AEROBIC AND ANAEROBIC Blood Culture adequate volume   Culture NO GROWTH 5 DAYS  Final   Report Status  04/08/2017 FINAL  Final  Culture, blood (Routine X 2) w Reflex to ID Panel     Status: None   Collection Time: 04/03/17  7:01 PM  Result Value Ref Range Status   Specimen Description BLOOD RIGHT ANTECUBITAL  Final   Special Requests   Final    BOTTLES DRAWN AEROBIC AND ANAEROBIC Blood Culture results may not be optimal due to an excessive volume of blood received in culture bottles   Culture NO GROWTH 5 DAYS  Final   Report Status 04/08/2017 FINAL  Final  Surgical pcr screen     Status: None   Collection Time: 03/23/2017  7:43 AM  Result Value Ref Range Status   MRSA, PCR NEGATIVE NEGATIVE Final   Staphylococcus aureus NEGATIVE NEGATIVE Final    Comment: (NOTE) The Xpert SA Assay (FDA approved for NASAL specimens in patients 74 years of age and older), is one component of a comprehensive surveillance program. It is not intended to diagnose infection nor to guide or monitor treatment.   C difficile quick scan w PCR reflex     Status: Abnormal   Collection Time: 04/09/17 11:52 AM  Result Value Ref Range Status   C Diff antigen POSITIVE (A) NEGATIVE Final   C Diff toxin NEGATIVE NEGATIVE Final   C Diff interpretation Results are indeterminate. See PCR results.  Final  Clostridium Difficile by PCR     Status: Abnormal   Collection Time: 04/09/17 11:52 AM  Result Value Ref Range Status   Toxigenic C Difficile by pcr POSITIVE (A) NEGATIVE Final    Comment: Positive for toxigenic C. difficile with little to no toxin production. Only treat if clinical presentation suggests symptomatic illness.  Culture, blood (routine x 2)     Status: None (Preliminary result)   Collection Time: 05/11/2017 10:45 AM  Result Value Ref Range Status   Specimen Description BLOOD RIGHT PICC LINE  Final   Special Requests   Final    BOTTLES DRAWN AEROBIC AND ANAEROBIC Blood Culture results may not be optimal due to an excessive volume of blood received in culture bottles   Culture NO GROWTH 3 DAYS  Final   Report  Status PENDING  Incomplete  Culture, blood (routine x 2)     Status: None (Preliminary result)   Collection Time: 05/08/2017  4:00 PM  Result Value Ref Range Status   Specimen Description BLOOD RIGHT HAND  Final   Special Requests IN PEDIATRIC BOTTLE Blood Culture adequate volume  Final   Culture NO GROWTH 3 DAYS  Final   Report Status PENDING  Incomplete    Coagulation Studies: Recent Labs    04/15/17 0334  LABPROT 20.1*  INR 1.73    Urinalysis: No results for input(s): COLORURINE, LABSPEC, PHURINE, GLUCOSEU, HGBUR, BILIRUBINUR, KETONESUR, PROTEINUR, UROBILINOGEN, NITRITE, LEUKOCYTESUR in the last 72 hours.  Invalid input(s): APPERANCEUR    Imaging: Dg Chest Port 1 View  Result Date: 04/16/2017 CLINICAL DATA:  Hypoxia EXAM: PORTABLE CHEST 1 VIEW COMPARISON:  April 12, 2017 FINDINGS: Endotracheal tube tip at the carina. Nasogastric tube tip and side port are below the diaphragm with the side port seen in the stomach. Central catheter tip is in the right atrium slightly beyond the cavoatrial junction. No pneumothorax. There is patchy airspace consolidation in the left lower lobe. Lungs elsewhere are clear. Heart is mildly enlarged with pulmonary vascularity within normal limits. No adenopathy. No evident bone lesions. IMPRESSION: Tube and catheter positions as described without pneumothorax. Note that the endotracheal tube tip is at the carina. Advise withdrawing endotracheal tube approximately 3 cm. Patchy airspace consolidation, likely pneumonia or possibly aspiration left lower lobe. Stable cardiac prominence. These results will be called to the ordering clinician or representative by the Radiologist Assistant, and communication documented in the PACS or zVision Dashboard. Electronically Signed   By: Lowella Grip III M.D.   On: 04/16/2017 11:41   Dg Abd 2 Views  Result Date: 04/16/2017 CLINICAL DATA:  Abdominal pain, diabetes mellitus, hypertension, end-stage renal disease EXAM:  ABDOMEN - 2 VIEW COMPARISON:  CT abdomen and pelvis 05/08/2017 FINDINGS: Medication tablet projects over RIGHT lower quadrant. Scattered radiodense medication in bowel. No bowel dilatation or bowel wall thickening. Bones appear demineralized. No free intraperitoneal air on LEFT lateral decubitus view. Bones appear demineralized. IMPRESSION: Normal bowel gas pattern. Electronically Signed   By: Lavonia Dana M.D.   On: 04/16/2017 08:40     Medications:   . famotidine (PEPCID) IV Stopped (04/16/17 1812)  . fentaNYL infusion INTRAVENOUS 50 mcg/hr (04/16/17 2324)  . ferric gluconate (FERRLECIT/NULECIT) IV Stopped (04/14/17 1638)  . norepinephrine (LEVOPHED) Adult infusion 20 mcg/min (05-May-2017 0602)  . dialysate (PRISMASATE) 2,000 mL/hr at 2017-05-05 0559  . dialysis replacement fluid (prismasate) 800 mL/hr at 05-05-2017 0648  . dialysis replacement fluid (prismasate) 700 mL/hr at 04/16/17 2351  .  sodium bicarbonate  infusion 1000 mL    . sodium chloride    . sodium chloride 999 mL (May 05, 2017 0320)  . sodium thiosulfate infusion for calciphylaxis Stopped (04/14/17 1752)   . bacitracin   Topical Once  . chlorhexidine gluconate (MEDLINE KIT)  15 mL Mouth Rinse BID  . Chlorhexidine Gluconate Cloth  6 each Topical Daily  . darbepoetin (ARANESP) injection - DIALYSIS  200 mcg Intravenous Q Thu-HD  . fentaNYL (SUBLIMAZE) injection  50 mcg Intravenous Once  . heparin subcutaneous  5,000 Units Subcutaneous Q8H  . insulin aspart  0-9 Units Subcutaneous Q4H  . lanthanum  1,000 mg Oral TID WC  . lidocaine-EPINEPHrine  10 mL Intradermal Once  . mouth rinse  15 mL Mouth Rinse QID  . multivitamin  1 tablet Oral QHS  . patiromer  8.4 g Oral Daily  . sodium chloride flush  10-40 mL Intracatheter Q12H  . sucroferric oxyhydroxide  1,000 mg Oral TID WC  . vancomycin  500 mg Oral QID   fentaNYL, heparin, hydrALAZINE, lidocaine (PF), lidocaine, midazolam, midazolam, MUSCLE RUB, ondansetron **OR** ondansetron  (ZOFRAN) IV, sodium chloride, sodium chloride, sodium  chloride flush  Assessment/ Plan:  1. Leukocytosis: worsening. LFTs up too. CT without abscess 11/2, CT panc pending. She appears diaphoretic with some shock and hypotension.  Now on pressors with increased requirement  2. ESRD- TTS schedule. CLIP'd to [facility] when ready for d/c but there are multiple barriers. 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 started Na thiosulfate (10/30-) TIW with dialysis. 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   pall care c/s to set goals of care involved .  4. Peripheral arterial disease: angio rescheduled 5. HTN - continue amlodipine 6. 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 DM - per primary 9. Obesity - will need continued encouragement  10. Hyperlipidemia-  Stop simvastatin 11. C diff:on po vanc 12. Dispo:patient decompensated after catastrophic events suspect septic shock and multiorgan failure. Now DNR and view to withdraw dialysis support     LOS: 18 Amad Mau W _0 _1 :27 AM

## 2017-05-12 NOTE — Progress Notes (Signed)
    [facility]   Reason for visit: Follow up on leukocytosis  Interval History: WBC up, continues on oral vancomycin, no new positive cultures, did not tolerate CRRT.  Seen this am  Physical Exam: Constitutional:  Vitals:   04/28/2017 1045 05/01/2017 1100  BP:    Pulse:    Resp: 16 20  Temp: 99.1 F (37.3 C) 99 F (37.2 C)  SpO2:     patient has no response Respiratory: Normal respiratory effort; CTA B Cardiovascular: Tachy RR GI: soft, nt, nd Lines: no erythema  Review of Systems: Unable to be assessed due to mental status  Lab Results  Component Value Date   WBC 39.3 (H) 04/24/2017   HGB 9.1 (L) 05/02/2017   HCT 30.4 (L) 04/14/2017   MCV 97.7 05/10/2017   PLT 179 05/08/2017    Lab Results  Component Value Date   CREATININE 5.32 (H) 04/15/2017   BUN 64 (H) 05/01/2017   NA 136 04/12/2017   K 5.3 (H) 04/22/2017   CL 96 (L) 05/06/2017   CO2 16 (L) 04/24/2017    Lab Results  Component Value Date   ALT 443 (H) 05/05/2017   AST 1,787 (H) 04/13/2017   ALKPHOS 175 (H) 05/01/2017     Microbiology: Recent Results (from the past 240 hour(s))  C difficile quick scan w PCR reflex     Status: Abnormal   Collection Time: 04/09/17 11:52 AM  Result Value Ref Range Status   C Diff antigen POSITIVE (A) NEGATIVE Final   C Diff toxin NEGATIVE NEGATIVE Final   C Diff interpretation Results are indeterminate. See PCR results.  Final  Clostridium Difficile by PCR     Status: Abnormal   Collection Time: 04/09/17 11:52 AM  Result Value Ref Range Status   Toxigenic C Difficile by pcr POSITIVE (A) NEGATIVE Final    Comment: Positive for toxigenic C. difficile with little to no toxin production. Only treat if clinical presentation suggests symptomatic illness.  Culture, blood (routine x 2)     Status: None (Preliminary result)   Collection Time: 04/19/2017 10:45 AM  Result Value Ref Range Status   Specimen Description BLOOD RIGHT PICC LINE  Final   Special Requests   Final    BOTTLES DRAWN AEROBIC AND ANAEROBIC Blood Culture results may not be optimal due to an excessive volume of blood received in culture bottles   Culture NO GROWTH 4 DAYS  Final   Report Status PENDING  Incomplete  Culture, blood (routine x 2)     Status: None (Preliminary result)   Collection Time: 04/23/2017  4:00 PM  Result Value Ref Range Status   Specimen Description BLOOD RIGHT HAND  Final   Special Requests IN PEDIATRIC BOTTLE Blood Culture adequate volume  Final   Culture NO GROWTH 4 DAYS  Final   Report Status PENDING  Incomplete    Impression/Plan:  1. Leukocytosis - no etiology identified.  On oral vancomycin and will continue. 2. Cdiff infection, possible - on oral vancomycin with concern for #1.  Continues to decline. 3.  Palliative - followed by palliative care and progress noted.

## 2017-05-12 NOTE — Care Management Note (Addendum)
Case Management Note Original Note Created RoyalAnnamarie Major, Cheryl U, RN 04/06/2017, 3:33 PM  Patient Details  Name: Roma KayserDiahann Kimrey V. MRN: [id number] Date of Birth: 10/30/1965  Subjective/Objective:     CM following for progression and d/c planning.                Action/Plan: 04/06/2017 Noted PT notes and need for PT/OT. Pt being followed by CIR nurse coordinator , Ottie GlazierBarbara Boyette, who has explored pt insurance and informed pt of large copay requirements. No bed available on CIR at this time.  Await DME recommendations from PT/OT if pt is not able to d/c to CIR.  Expected Discharge Date:                  Expected Discharge Plan:  Skilled Nursing Facility  In-House Referral:  Clinical Social Work  Discharge planning Services  CM Consult  Post Acute Care Choice:    Choice offered to:     DME Arranged:    DME Agency:     HH Arranged:    HH Agency:     Status of Service:  Completed, signed off  If discussed at [employer] Length of Stay Meetings, dates discussed:    Discharge Disposition: expired  Additional Comments:  04/20/2017- 1420- Katalyna Socarras RN, CM- pt tx to ICU on 11/5 following cardiac arrest- unable to tolerate CRRT, pt expired today while on vent- was DNR-  family at the bedside.   Zenda AlpersWebster, BladenKristi Hall, RN 05/01/2017, 2:20 PM [phone]

## 2017-05-12 NOTE — Progress Notes (Signed)
RT called to room by RN. Patient had coded and passed away on ventilator. Patient is DNR and family is at bedside. RT turned ventilator off per family request.

## 2017-05-12 NOTE — Discharge Summary (Signed)
PULMONARY / CRITICAL CARE MEDICINE   Name: Teresa KayserDiahann Kathan V. MRN: [id number] DOB: 15-Aug-1965    ADMISSION DATE:  04-20-17 CONSULTATION DATE:  04/16/17  REFERRING MD:  Sharolyn DouglasEzenduka  CHIEF COMPLAINT:  Cardiac Arrest  HISTORY OF PRESENT ILLNESS:  Teresa KayserDiahann Worthey V. is a 51 y.o. female  admitted 08-Mar-2017 with worsening peripheral edema x several weeks and worsening stage IV renal failure with hyperkalemia, C.diff colitis (with minimal diarrhea) She had seen nephrology in 2015 but then never followed up.  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  She had tunneled catheter placed on 10/20 by IR and was started HD.  She later had AV fistula placed in the left arm on 10/25.  Since admission, she had been continued on HD.  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  Pt opted for continued full code status  Overnight 11/4, rapid response called as pt was very agitated, diaphoretic and hypoglycemic. On 11/5, she was found to be lethargic with hypertension.  She proceeded to have cardiac arrest.  Required 10 minutes of ACLS before ROSC.  She was subsequently transferred to the ICU and PCCM was asked to take over her care.   Past Medical History:  Diagnosis Date  . Anemia of chronic disease    /notes 04/02/2017  . CKD (chronic kidney disease), stage IV (HCC)    rin 2015/notes 04/02/2017; enal insufficiency (1.6-1.9 creatinine)  . ESRD (end stage renal disease) on dialysis [facility](HCC)    "new onset; just started dialysis 2 days ago" (04/03/2017)  . Hyperlipidemia   . Hypertension   . Thyroid disease    ? past problem  . Type II diabetes mellitus (HCC)       STUDIES:  Renal US 10/20 > medical renal disease, no hydro, right renal cysts. Tib/Fib XR 10/25 > diffuse subcutaneous edema. CT head 10/31 > no acute process. CT A/P 11/2 > focal area at  pancreatic tail, recommend further eval with pancreatic protocol CT, hepatic steatosis, atrophic left kidney, gallbladder sludge.  CULTURES: Blood 10/23 > neg.  C.diff 10/29 > positive  ANTIBIOTICS: Vanc 10/30 > Cefazolin 10/20 > 10/25 Zosyn 11/2 > 11/2  SIGNIFICANT EVENTS: 10/19 > admit. 10/20 > tunneled HD cath placed 10/25 > left arm AVF placed 11/4 > palliative care consult 11/5 > PEA arrest, intubated and transferred to ICU  LINES/TUBES: Right IJ HD cath 10/20 >  ETT 11/5 >  A. Line 11/5 >   COURSE: 51 y.o. female with hx CKD stage 4 but no follow up, admitted 10/19 with progressive CKD, hyperkalemia, C.diff.  Found to have progression to ESRD, started on HD via temp cath.  AVF placed 10/25.  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 She required mechanical ventilation and CRRT was initiated with improvement in potassium.  However cardiogenic shock persisted and she eventually passed away on 11/6.  Cause of death -cardiogenic shock, end-stage renal disease, calciphylaxis, C. difficile colitis , DM-2    Oretha MilchALVA,Alisea Matte V. MD

## 2017-05-12 NOTE — Progress Notes (Addendum)
PULMONARY / CRITICAL CARE MEDICINE   Name: Teresa Dalton MRN: [id number] DOB: [birth year]    ADMISSION DATE:  03/15/2017 CONSULTATION DATE:  04/16/17  REFERRING MD:  Sharolyn Douglas  CHIEF COMPLAINT:  Cardiac Arrest  HISTORY OF PRESENT ILLNESS:  Teresa Dalton is a 51 y.o. female with PMH as outlined below.  She was admitted 03/31/2017 with worsening peripheral edema x several weeks and worsening stage IV renal failure with hyperkalemia, C.diff colitis (with minimal diarrhea) She had seen nephrology in 2015 but then never followed up.  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  She had tunneled catheter placed on 10/20 by IR and was started HD.  She later had AV fistula placed in the left arm on 10/25.  Since admission, she had been continued on HD.  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  Pt opted for continued full code status  Overnight 11/4, rapid response called as pt was very agitated, diaphoretic and hypoglycemic. On 11/5, she was found to be lethargic with hypertension.  She proceeded to have cardiac arrest.  Required 10 minutes of ACLS before ROSC.  She was subsequently transferred to the ICU and PCCM was asked to take over her care.    SUBJECTIVE:  Critically ill On vent, on levo @ 20 Sedated on fent gtt Called emergently for low BP 50s this am  VITAL SIGNS: BP (!) 90/39   Pulse 72   Temp 98.2 F (36.8 C)   Resp (!) 27   Ht 5\' 5"  (1.651 m)   Wt 238 lb 8.6 oz (108.2 kg)   LMP 03/26/2017   SpO2 94%   BMI 39.69 kg/m   HEMODYNAMICS:    VENTILATOR SETTINGS: Vent Mode: PRVC FiO2 (%):  [40 %-100 %] 40 % Set Rate:  [14 bmp-20 bmp] 14 bmp Vt Set:  [560 mL] 560 mL PEEP:  [5 cmH20] 5 cmH20 Pressure Support:  [10 cmH20] 10 cmH20 Plateau Pressure:  [22 cmH20-23 cmH20] 23 cmH20  INTAKE / OUTPUT: I/O last 3  completed shifts: In: 1375.6 [I.V.:955.6; Other:100; NG/GT:120; IV Piggyback:200] Out: 500 [Emesis/NG output:150; Other:350]   PHYSICAL EXAMINATION: General: Adult female, chronically ill appearing, obese Neuro: Sedated, unresponsive. HEENT: [place] / AT. MMM. Cardiovascular: RRR, no M/R/G. Lungs: CTAB.  ETT in place. Abdomen: BS x 4, soft.  Tenderness to abdomen prior to intubation. Musculoskeletal: BLE indurated and discolored, no deformities, no edema, LUE AVF -no thrill Skin: BLE with ischemic changes over feet & left thigh, skin  dry.   LABS:  BMET Recent Labs  Lab 04/16/17 1219 04/16/17 1600 04/16/17 2136 05/05/2017 0337  NA 139 137  --  136  K >7.5* 6.9* 5.2* 5.3*  CL 93* 94*  --  96*  CO2 13* 15*  --  16*  BUN 112* 102*  --  64*  CREATININE 10.01* 8.32*  --  5.32*  GLUCOSE 226* 234*  --  55*    Electrolytes Recent Labs  Lab 04/15/17 0334  04/16/17 1219 04/16/17 1600 05/02/2017 0337  CALCIUM 9.9  10.0   < > 11.7* 9.9 10.9*  MG  --   --   --   --  2.7*  PHOS 10.9*  --   --  13.2* 10.6*   < > = values in this interval not displayed.    CBC Recent Labs  Lab 04/15/17 0334 04/16/17 1219 05/01/2017  0337  WBC 31.2* 37.7* 39.3*  HGB 8.6* 9.4* 9.1*  HCT 27.5* 31.3* 30.4*  PLT 261 226 179    Coag's Recent Labs  Lab 04/15/17 0334  APTT 38*  INR 1.73    Sepsis Markers Recent Labs  Lab 04/23/2017 0850 04/14/2017 0908 05/11/2017 1553 04/16/17 1200  LATICACIDVEN  --  5.6* 2.3* 13.7*  PROCALCITON 43.95  --   --   --     ABG Recent Labs  Lab 04/16/17 1252 04/16/17 1833 04/19/2017 0337  PHART 7.196* 7.379 7.297*  PCO2ART 31.6* 36.1 33.6  PO2ART 193.0* 186.0* 83.0    Liver Enzymes Recent Labs  Lab 04/15/17 0334 04/16/17 0518 04/16/17 1600 04/21/2017 0337  AST 1,058* 826*  --  1,787*  ALT 266* 268*  --  443*  ALKPHOS 136* 161*  --  175*  BILITOT 0.9 1.2  --  1.4*  ALBUMIN 2.6*  2.5* 2.3* 2.2* 2.2*    Cardiac Enzymes Recent Labs  Lab  04/16/17 1219  TROPONINI 0.10*    Glucose Recent Labs  Lab 04/16/17 1514 04/16/17 2005 04/16/17 2350 05/03/2017 0347 04/29/2017 0405 05/05/2017 0810  GLUCAP 243* 190* 93 53* 191* 31*    Imaging Dg Chest Port 1 View  Result Date: 04/16/2017 CLINICAL DATA:  Hypoxia EXAM: PORTABLE CHEST 1 VIEW COMPARISON:  April 12, 2017 FINDINGS: Endotracheal tube tip at the carina. Nasogastric tube tip and side port are below the diaphragm with the side port seen in the stomach. Central catheter tip is in the right atrium slightly beyond the cavoatrial junction. No pneumothorax. There is patchy airspace consolidation in the left lower lobe. Lungs elsewhere are clear. Heart is mildly enlarged with pulmonary vascularity within normal limits. No adenopathy. No evident bone lesions. IMPRESSION: Tube and catheter positions as described without pneumothorax. Note that the endotracheal tube tip is at the carina. Advise withdrawing endotracheal tube approximately 3 cm. Patchy airspace consolidation, likely pneumonia or possibly aspiration left lower lobe. Stable cardiac prominence. These results will be called to the ordering clinician or representative by the Radiologist Assistant, and communication documented in the PACS or zVision Dashboard. Electronically Signed   By: Bretta BangWilliam  Woodruff III M.D.   On: 04/16/2017 11:41     STUDIES:  Renal US 10/20 > medical renal disease, no hydro, right renal cysts. Tib/Fib XR 10/25 > diffuse subcutaneous edema. CT head 10/31 > no acute process. CT A/P 11/2 > focal area at pancreatic tail, recommend further eval with pancreatic protocol CT, hepatic steatosis, atrophic left kidney, gallbladder sludge.  CULTURES: Blood 10/23 > neg.  C.diff 10/29 > positive  ANTIBIOTICS: Vanc 10/30 > Cefazolin 10/20 > 10/25 Zosyn 11/2 > 11/2  SIGNIFICANT EVENTS: 10/19 > admit. 10/20 > tunneled HD cath placed 10/25 > left arm AVF placed 11/4 > palliative care consult 11/5 > PEA arrest,  intubated and transferred to ICU  LINES/TUBES: Right IJ HD cath 10/20 >  ETT 11/5 >  A. Line 11/5 >   DISCUSSION: 51 y.o. female with hx CKD stage 4 but no follow up, admitted 10/19 with progressive CKD, hyperkalemia, C.diff.  Found to have progression to ESRD, started on HD via temp cath.  AVF placed 10/25.  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  ASSESSMENT / PLAN:  PULMONARY A: Acute Respiratory failure - following PEA arrest 11/5, s/p intubation. P:   Full vent support. VAP prevention measures.  CARDIOVASCULAR A:  PEA arrest - presumed due to hyperkalemia. Hx HTN, HLD. Cardiogenic shock P:  Cap levo @ 20 mcg   RENAL A:   ESRD - recent progression from stage 4, just started on HD this admission (T/Th/Sat). Hyperkalemia - s/p kayexalate, HCO3. AGMA - uremia + probable lactate. Extensive / severe calciphylaxis - poor prognosis. P:   CRRT on hold currently due to hypotension , K improved BMP in AM.  GASTROINTESTINAL A:   C.diff colitis. ? Pancreatic tail lesion. Transaminitis - ? Shock liver, preceded PEA arrest GI prophylaxis. Nutrition. P:   Might need additional pancreatic CT protocol imaging vs ultrasound. Trend LFT's. D/c tylenol, statin. SUP: Famotidine. NPO.   HEMATOLOGIC A:   Anemia - chronic. VTE Prophylaxis. P:  Transfuse for Hgb < 7. SCD's / heparin. CBC in AM.  INFECTIOUS A:   C.diff colitis. P:   Abx as above (vanc).  ID following.  ENDOCRINE A:   Hx DM II. P:   SSI. D10 gtt  NEUROLOGIC A:   Sedation due to mechanical ventilation. P:   Sedation:  Fentanyl gtt / midazolam PRN. RASS goal: 0 to -1. Daily WUA. D/c gabapentin, norco, dialudid, methocarbamol,   Family updated: Two sons updated ,DNR issued, sons indicate they would not want ehr to die on life support, will proceed with witdrawal when family  ready  Interdisciplinary Family Meeting v Palliative Care Meeting:  Due by: 04/22/17.  CC time: 45 min.  Cyril Mourning MD. Tonny Bollman. [facility] Pager [phone] If no response call [phone]     May 02, 2017, 8:53 AM   Addendum- GOC discussion with family - they are not ready for withdrawal at this time but agree that pain relief is #1 priority. They understand that she may die on vent without dialysis  Oretha Milch. MD

## 2017-05-12 NOTE — Progress Notes (Signed)
Hypoglycemic Event  CBG: 53  Treatment: D50 IV 50 mL  Symptoms: None  Follow-up CBG: Time:0400 CBG Result:191  Possible Reasons for Event: Inadequate meal intake  Comments/MD notified:per protocol    Terrilyn SaverHopper, Kaegan Hettich Anderson

## 2017-05-12 NOTE — Progress Notes (Signed)
OT Cancellation Note  Patient Details Name: Teresa Dalton. MRN: [id number] DOB: Oct 23, 1965   Cancelled Treatment:    Reason Eval/Treat Not Completed: Medical issues which prohibited therapy. Continued medical events overnight. Pt now hypotensive on pressors. Intubated following PEA arrest previous date. Will check back if/when medically appropriate.  Doristine Sectionharity A Ellamay Fors, MS OTR/L  Pager: [phone]   Doristine SectionCharity A Sahvanna Mcmanigal 04/19/2017, 6:52 AM

## 2017-05-12 NOTE — Progress Notes (Signed)
Dr. Celene SkeenNester notified of continued SBP 40-50; instructed to return blood on CRRT and stop treatment; will notify Dr. Vassie LollAlva; given one amp of calcium per order.  Will continue to monitor closely.

## 2017-05-12 NOTE — Progress Notes (Signed)
PT Cancellation Note  Patient Details Name: Teresa KayserDiahann Rogalski V. MRN: [id number] DOB: 08-06-1965   Cancelled Treatment:    Reason Eval/Treat Not Completed: (P) Other (comment)(Pt expired per RN.  )   Florestine AversAimee J Chloee Tena 04/23/2017, 12:07 PM  Joycelyn RuaAimee Quintrell Baze, PTA pager [phone]

## 2017-05-12 NOTE — Progress Notes (Signed)
   04/12/2017 1100  Clinical Encounter Type  Visited With Patient and family together;Health care provider  Visit Type Critical Care  Referral From Nurse  Consult/Referral To Chaplain  Spiritual Encounters  Spiritual Needs Emotional;Prayer;Grief support   Paged to 2H to support a family with a loved one who was in the final stages.  Greeted and stayed with the family offering prayer and support.  Returned with the family and was with them when she passed.  Waiting for family to return for prayer and to discuss next steps.  Will continue to follow. Chaplain Agustin CreeNewton Lansing Sigmon

## 2017-05-12 NOTE — Progress Notes (Signed)
eLink Physician-Brief Progress Note Patient Name: Teresa KayserDiahann Wesolowski V. DOB: 1965/12/09 MRN: [id number]   Date of Service  04/15/2017  HPI/Events of Note  Hypotension - SBP in 70's at ceiling of Norepinephrine. Her pH = 7.29.  eICU Interventions  Will order: 1. NaHCO3 50 meq IV now.  2. Ground team to speak with family about comfort care.      Intervention Category Major Interventions: Hypotension - evaluation and management  Sommer,Steven Eugene 05/03/2017, 6:27 AM

## 2017-05-12 NOTE — Progress Notes (Signed)
Daily Progress Note   Patient Name: Teresa Dalton.       Date: 05-03-2017 DOB: [birth year]  Age: 51 y.o. MRN#: [id number] Attending Physician: Alma Friendly, MD Primary Care Physician: Patient, No Pcp Per Admit Date: 04/04/2017  Reason for Consultation/Follow-up: Establishing goals of care, Pain control and Psychosocial/spiritual support  Subjective: Teresa Dalton has just coded. Family planning to gather.   Length of Stay: 18  Current Medications: Scheduled Meds:  . bacitracin   Topical Once  . chlorhexidine gluconate (MEDLINE KIT)  15 mL Mouth Rinse BID  . Chlorhexidine Gluconate Cloth  6 each Topical Daily  . darbepoetin (ARANESP) injection - DIALYSIS  200 mcg Intravenous Q Thu-HD  . fentaNYL (SUBLIMAZE) injection  50 mcg Intravenous Once  . heparin subcutaneous  5,000 Units Subcutaneous Q8H  . insulin aspart  0-9 Units Subcutaneous Q4H  . lanthanum  1,000 mg Oral TID WC  . lidocaine-EPINEPHrine  10 mL Intradermal Once  . mouth rinse  15 mL Mouth Rinse QID  . multivitamin  1 tablet Oral QHS  . patiromer  8.4 g Oral Daily  . sodium chloride flush  10-40 mL Intracatheter Q12H  . sucroferric oxyhydroxide  1,000 mg Oral TID WC  . vancomycin  500 mg Oral QID    Continuous Infusions: . dextrose 50 mL (May 03, 2017 0923)  . famotidine (PEPCID) IV Stopped (04/16/17 1812)  . fentaNYL infusion INTRAVENOUS Stopped (05-03-17 0900)  . ferric gluconate (FERRLECIT/NULECIT) IV Stopped (04/14/17 1638)  . norepinephrine (LEVOPHED) Adult infusion 20 mcg/min (2017/05/03 0925)  . dialysate (PRISMASATE) 2,000 mL/hr at 05-03-17 0559  . dialysis replacement fluid (prismasate) 800 mL/hr at May 03, 2017 0648  . dialysis replacement fluid (prismasate) 700 mL/hr at 04/16/17 2351  .  sodium  bicarbonate  infusion 1000 mL    . sodium chloride    . sodium chloride 999 mL (05-03-17 0320)  . sodium thiosulfate infusion for calciphylaxis Stopped (04/14/17 1752)    PRN Meds: fentaNYL, heparin, lidocaine (PF), lidocaine, midazolam, midazolam, MUSCLE RUB, ondansetron **OR** ondansetron (ZOFRAN) IV, sodium chloride, sodium chloride, sodium chloride flush  Physical Exam  Constitutional: She appears well-developed. She appears toxic. She is sedated and intubated.  Obese   Cardiovascular: Normal rate and regular rhythm.  Pulmonary/Chest: No accessory muscle usage. No tachypnea. She is intubated. No respiratory distress.  Abdominal: Normal appearance.  Neurological:  Sedated on vent  Nursing note and vitals reviewed.           Vital Signs: BP (!) 90/39   Pulse 72   Temp 99.5 F (37.5 C)   Resp 20   Ht '5\' 5"'$  (1.651 m)   Wt 108.2 kg (238 lb 8.6 oz)   LMP 03/26/2017   SpO2 94%   BMI 39.69 kg/m  SpO2: SpO2: 94 % O2 Device: O2 Device: Ventilator O2 Flow Rate: O2 Flow Rate (L/min): 1 L/min  Intake/output summary:   Intake/Output Summary (Last 24 hours) at 2017/04/20 1036 Last data filed at 2017/04/20 0900 Gross per 24 hour  Intake 1455.61 ml  Output 500 ml  Net 955.61 ml   LBM: Last BM Date: 04/10/17 Baseline Weight: Weight: 90.7 kg (200 lb) Most recent weight: Weight: 108.2 kg (238 lb 8.6 oz)       Palliative Assessment/Data:    Flowsheet Rows     Most Recent Value  Intake Tab  Referral Department  Nephrology  Unit at Time of Referral  Med/Surg Unit  Palliative Care Primary Diagnosis  Nephrology  Date Notified  04/14/17  Palliative Care Type  New Palliative care  Reason for referral  Pain, Clarify Goals of Care  Date of Admission  04/08/2017  Date first seen by Palliative Care  04/15/17  # of days Palliative referral response time  1 Day(s)  # of days IP prior to Palliative referral  15  Clinical Assessment  Palliative Performance Scale Score  30%  Pain Max  last 24 hours  Not able to report  Pain Min Last 24 hours  Not able to report  Dyspnea Max Last 24 Hours  Not able to report  Dyspnea Min Last 24 hours  Not able to report  Nausea Max Last 24 Hours  Not able to report  Nausea Min Last 24 Hours  Not able to report  Anxiety Max Last 24 Hours  Not able to report  Anxiety Min Last 24 Hours  Not able to report  Other Max Last 24 Hours  Not able to report  Psychosocial & Spiritual Assessment  Palliative Care Outcomes  Palliative Care follow-up planned  Yes, Facility      Patient Active Problem List   Diagnosis Date Noted  . Hyperkalemia   . Cardiac arrest, cause unspecified ([place])   . Pressure injury of skin 04/16/2017  . Acidemia   . Acute hypoxemic respiratory failure ([place])   . Goals of care, counseling/discussion   . Palliative care encounter   . Hypoglycemia   . Transaminitis   . Palliative care by specialist   . Calciphylaxis   . C. difficile diarrhea   . Anemia of chronic renal failure   . Leg pain   . ESRD (end stage renal disease) ([place])   . Fever   . Leukocytosis   . Acute on chronic kidney failure ([place]) 03/14/2017  . CKD stage 5 secondary to hypertension ([place]) 03/26/2017  . Vaginal candidiasis 07/21/2011  . Chronic renal insufficiency 03/10/2011  . Tobacco abuse 03/10/2011  . Swelling of eyelid 11/08/2010  . Proteinuria 11/01/2010  . THYROID CYST 08/12/2010  . DM2 (diabetes mellitus, type 2) ([place]) 08/12/2010  . Essential hypertension 08/12/2010    Palliative Care Assessment & Plan   HPI: 51 y.o. female  with past medical history of ESRD on HD, DM, HTN, HLD admitted on 03/20/2017 with LE swelling. She has had a prolonged hospitalization with difficulty  with HD, now with calciphylaxis. She has been in severe pain to LE as well as abd. Stool positive for c.diff but no diarrhea. She is being followed by nephrology, as well as ID.  Assessment: Teresa Dalton has continued to decline overnight with becoming hypotensive,  max vasopressors, and unable to tolerate CRRT. Met with sons, sister, and another family member. Chaplain was called at their request for comfort and prayer. Discussed her ability to not tolerate dialysis and what to expect with renal failure - focused on the fact that this should just make her sleep and ultimately make her more comfortable. They are awaiting for more family on their way from [place] today and are not prepared to extubate at this time. They are aware that she is dying. Son asked how long she has but patient's sister intervened and said that they just have to pray and that God is the only one in control of her fate. She is praying for a miracle (but I believe still understands poor prognosis). Emotional support provided to family. Sons priority is to make sure she is comfortable and not suffering.   Recommendations/Plan:  Severe pain abd/LE r/t calciphylaxis: Renal managing lanthanum and sodium thiosulfate. Agree with fentanyl 50 mcg every hour prn while intubated. Increase as needed to achieve comfort.   Versed prn agitation while on vent.    Code Status:  DNR  Prognosis:   Family understands that prognosis is extremely poor and that she is dying.   Discharge Planning:  Anticipate hospital death.   Care plan was discussed with RN.   Thank you for allowing the Palliative Medicine Team to assist in the care of this patient.   Total Time 35 min Prolonged Time Billed  no       Greater than 50%  of this time was spent counseling and coordinating care related to the above assessment and plan.  Vinie Sill, NP Palliative Medicine Team Pager # [phone] (M-F 8a-5p) Team Phone # [phone] (Nights/Weekends)

## 2017-05-12 DEATH — deceased

## 2017-05-15 ENCOUNTER — Encounter (HOSPITAL_COMMUNITY): Payer: Self-pay

## 2017-05-18 ENCOUNTER — Encounter: Payer: Self-pay | Admitting: Vascular Surgery

## 2019-09-21 IMAGING — CT CT HEAD W/O CM
3 series · 15 of 47 positions shown, 18 images · non-contrast
Comparison: None.

CLINICAL DATA: 51 y/o  F; altered mental status.

EXAM:
CT HEAD WITHOUT CONTRAST
TECHNIQUE: Contiguous axial images were obtained from the base of the skull
through the vertex without intravenous contrast.

[Series 3: head 5.0 h30s · axial · 0.46mm/px · z∈[-157,-22]mm · 9 of 33 slices shown, 12 images]
[im 3/33  brain]
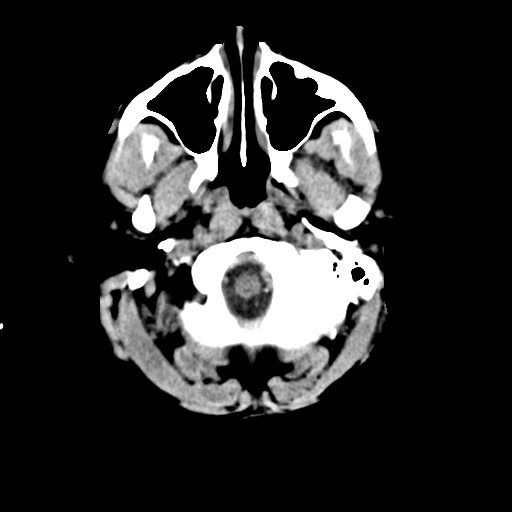
[im 3/33  bone]
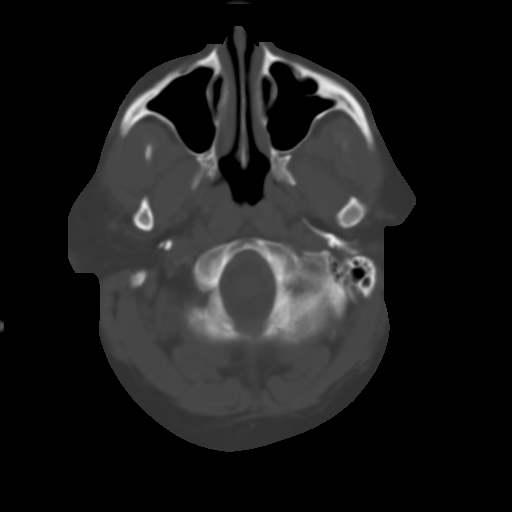
[im 6/33  brain]
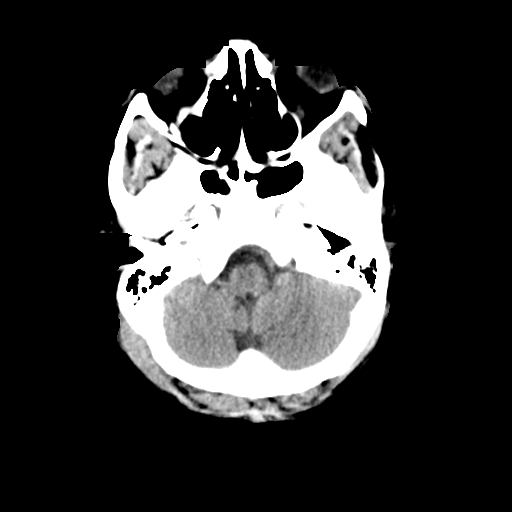
[im 9/33  brain]
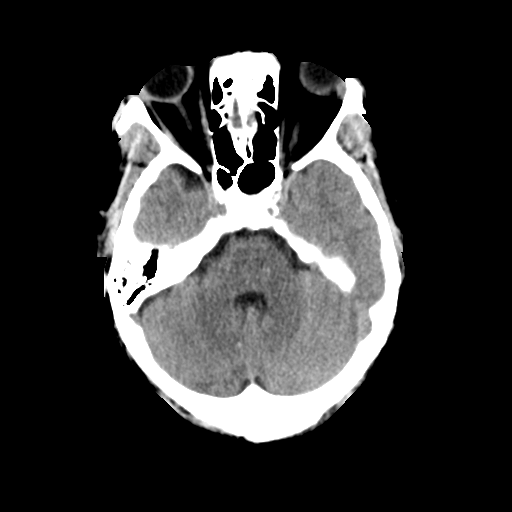
[im 13/33  brain]
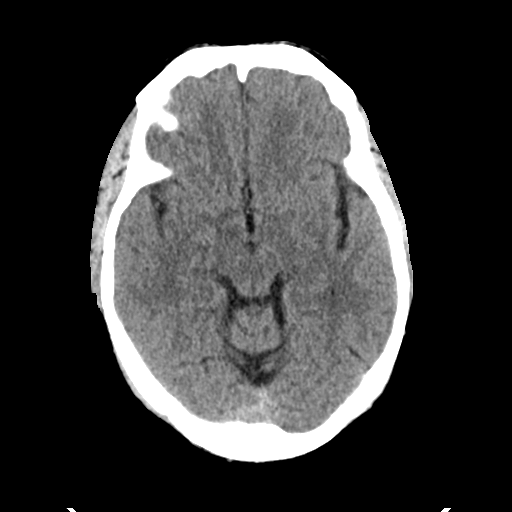
[im 17/33  brain]
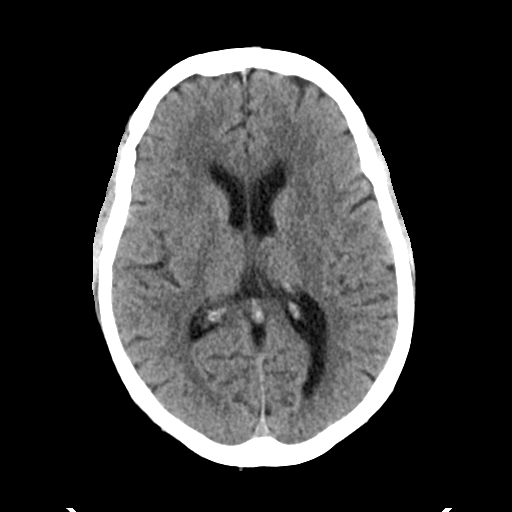
[im 17/33  bone]
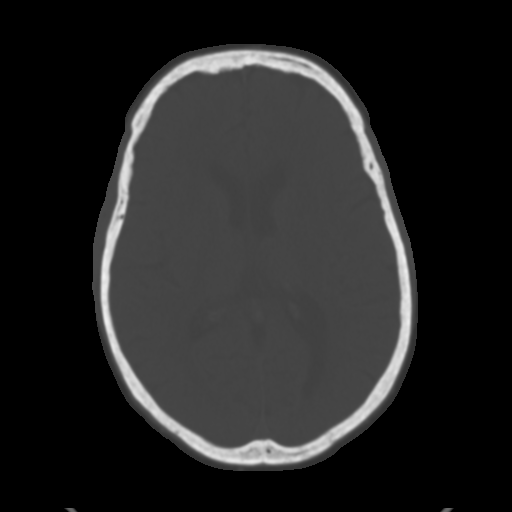
[im 20/33  brain]
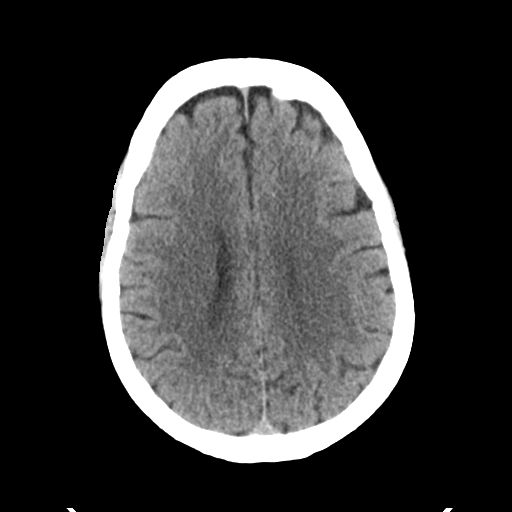
[im 24/33  brain]
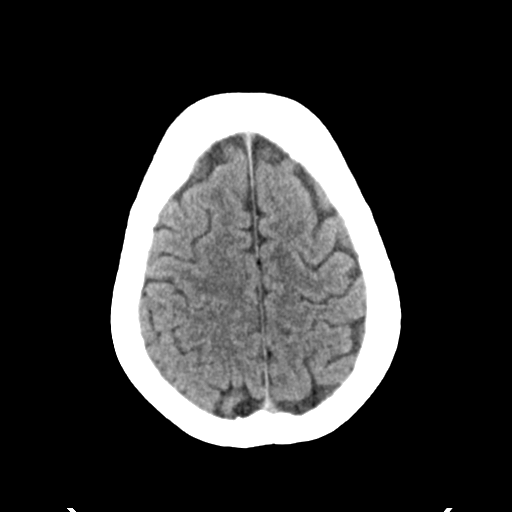
[im 27/33  brain]
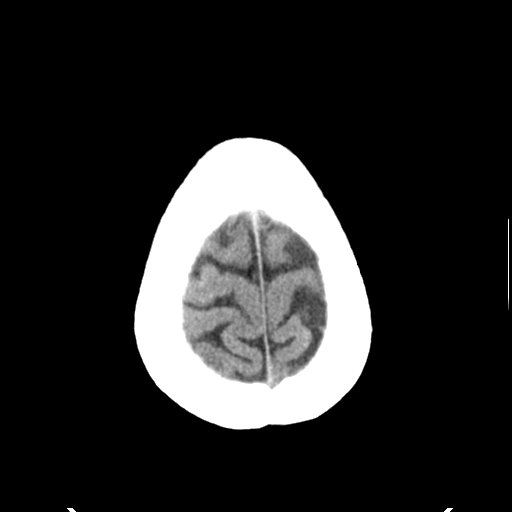
[im 30/33  brain]
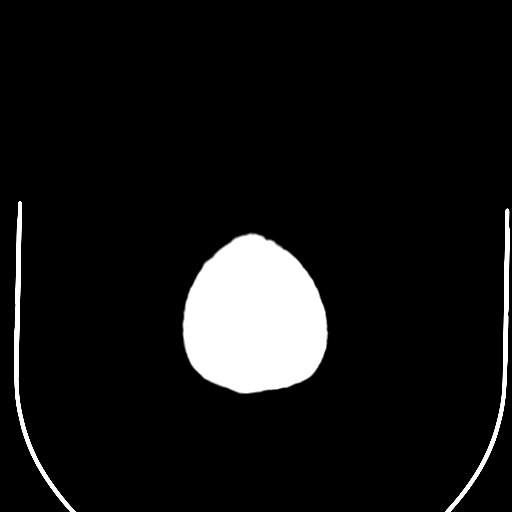
[im 30/33  bone]
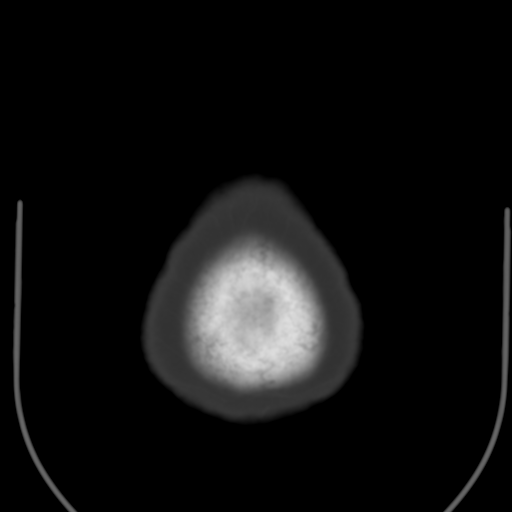

[Series 5: head 3.0 mpr cor · coronal · 0.31mm/px · 3 of 72 slices shown]
[im 24/72  brain]
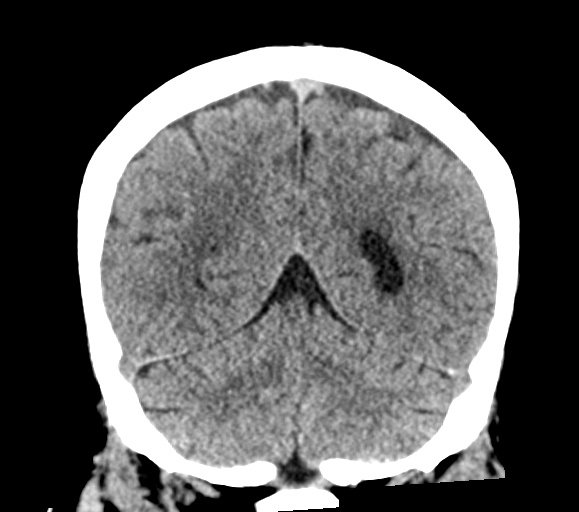
[im 32/72  brain]
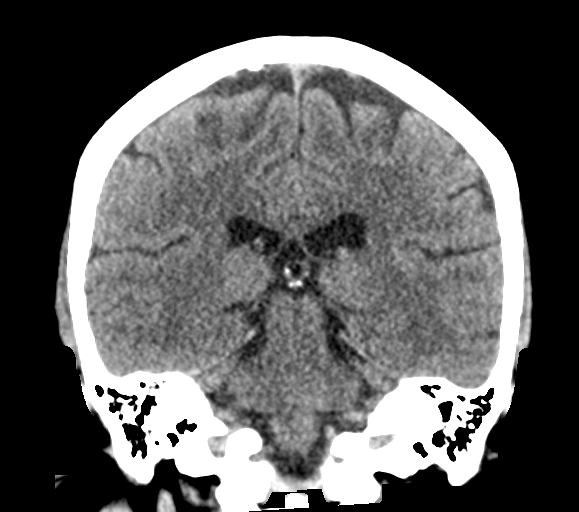
[im 40/72  brain]
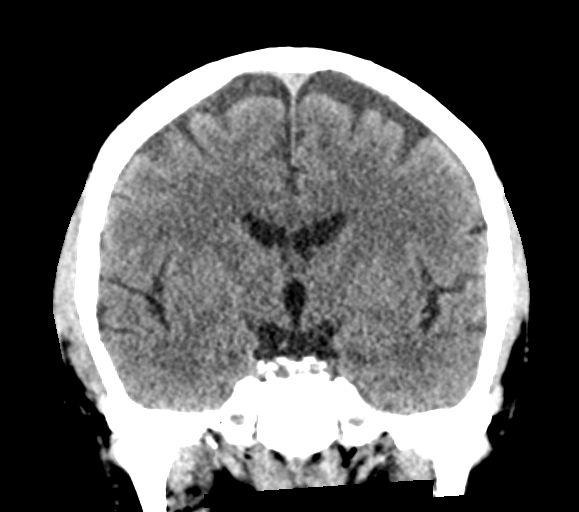

[Series 6: head 3.0 mpr sag · sagittal · 0.31mm/px · 3 of 63 slices shown]
[im 21/63  brain]
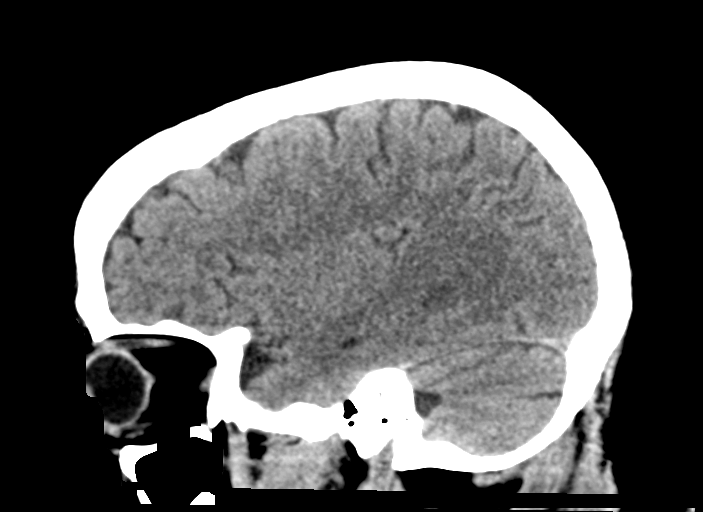
[im 32/63  brain]
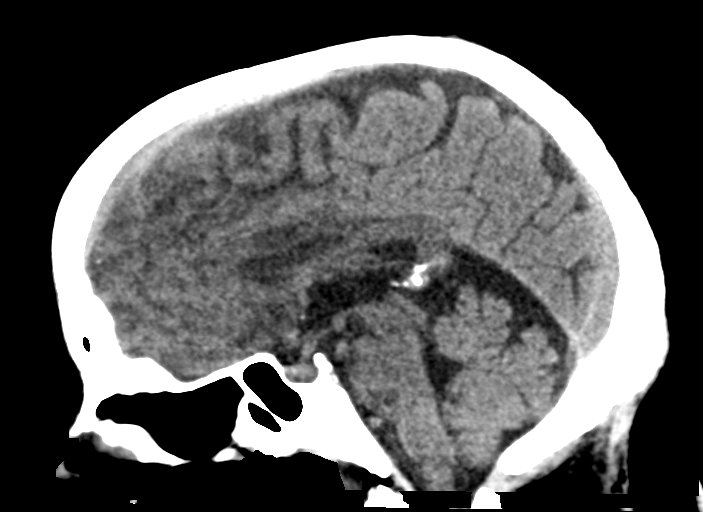
[im 42/63  brain]
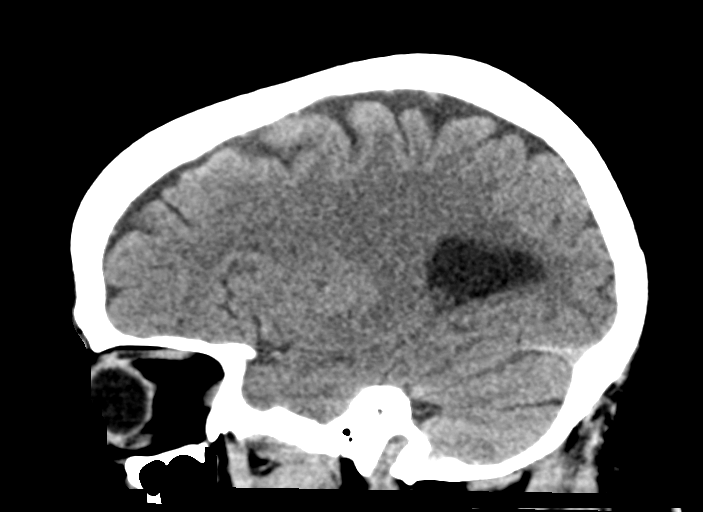

[15 of 47 positions shown; findings below may reference images not displayed]

FINDINGS: Brain: No evidence of acute infarction, hemorrhage, hydrocephalus,
extra-axial collection or mass lesion/mass effect.

Vascular: Calcific atherosclerosis of carotid siphons. No hyperdense
vessel.

Skull: Normal. Negative for fracture or focal lesion.

Sinuses/Orbits: No acute finding.

Other: None.
IMPRESSION: 1. No acute intracranial abnormality.
2. Advanced calcific atherosclerosis of carotid siphons.
3. Otherwise unremarkable CT of the head.

By: Analia Cecilia Norambuena M.D.

## 2019-09-22 IMAGING — CR DG CHEST 2V
2 series · 2 of 2 positions shown · non-contrast
Comparison: Chest x-ray of April 03, 2017

CLINICAL DATA: Elevated white blood cell count, mental status
change. History of diabetes, end-stage renal disease on dialysis.
Current smoker.

EXAM:
CHEST  2 VIEW

[chest ap]
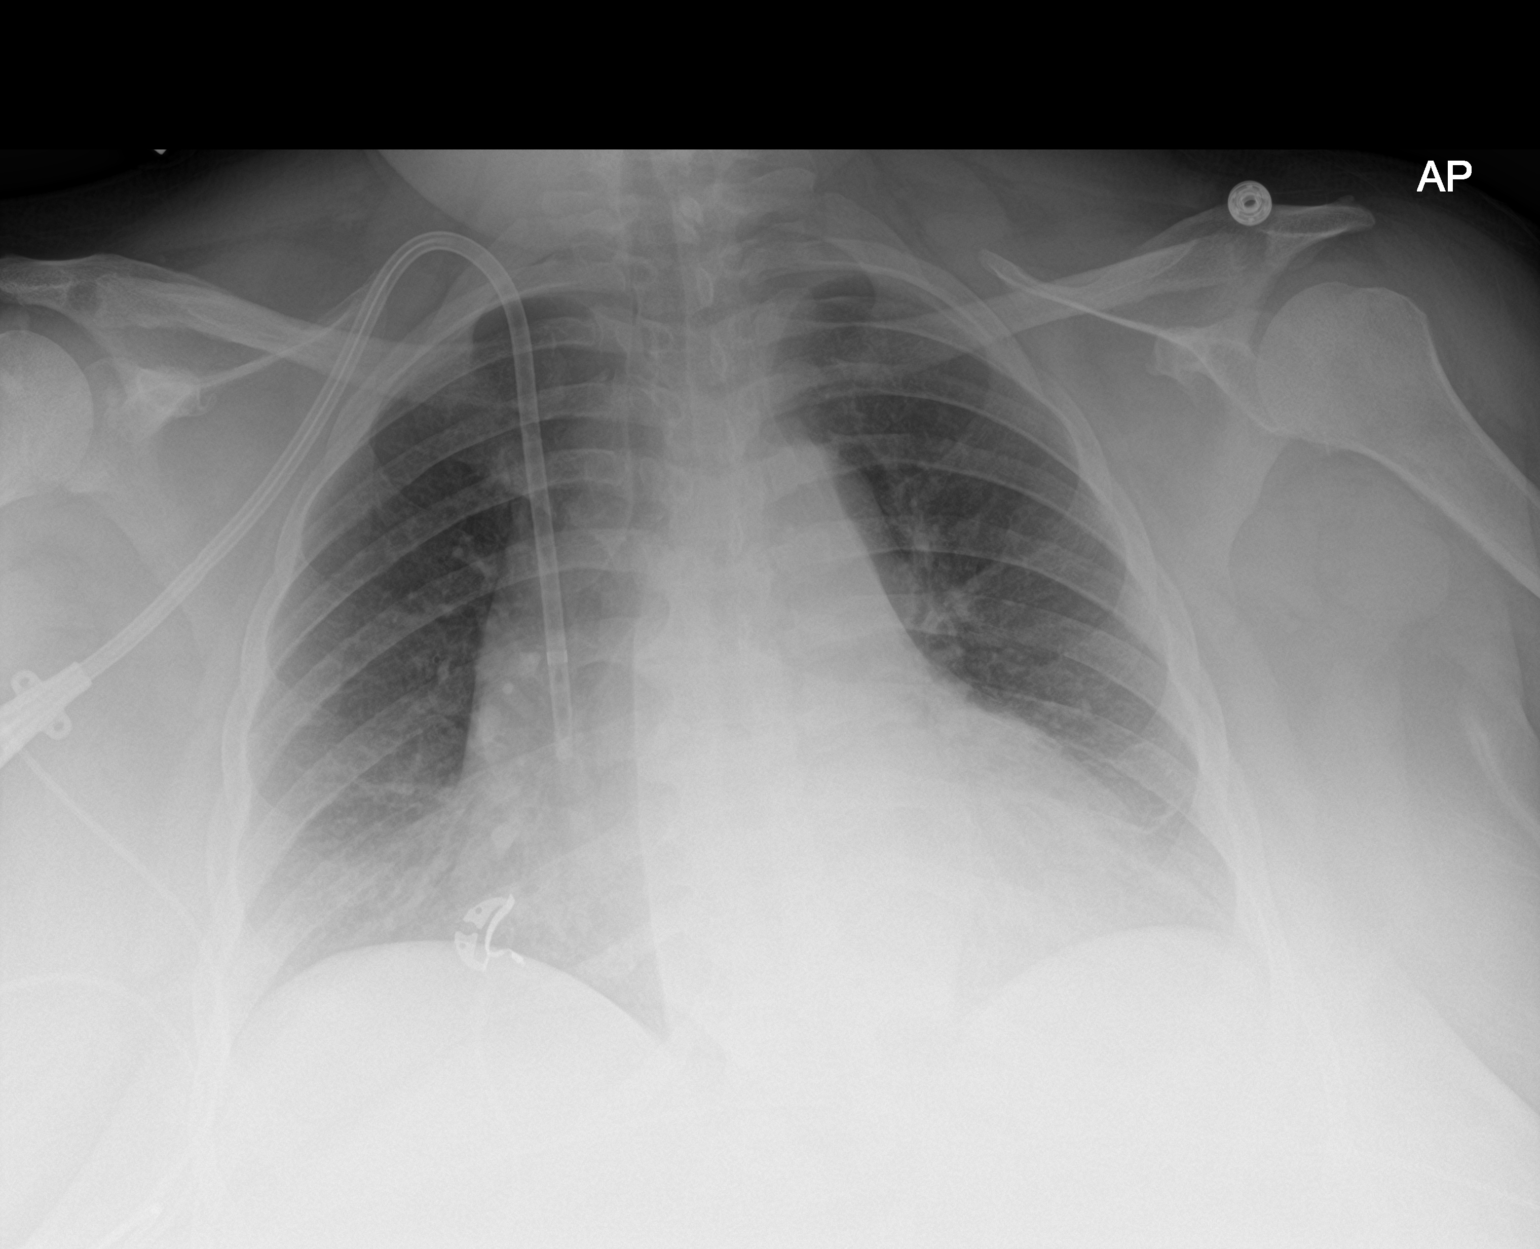

[chest lat]
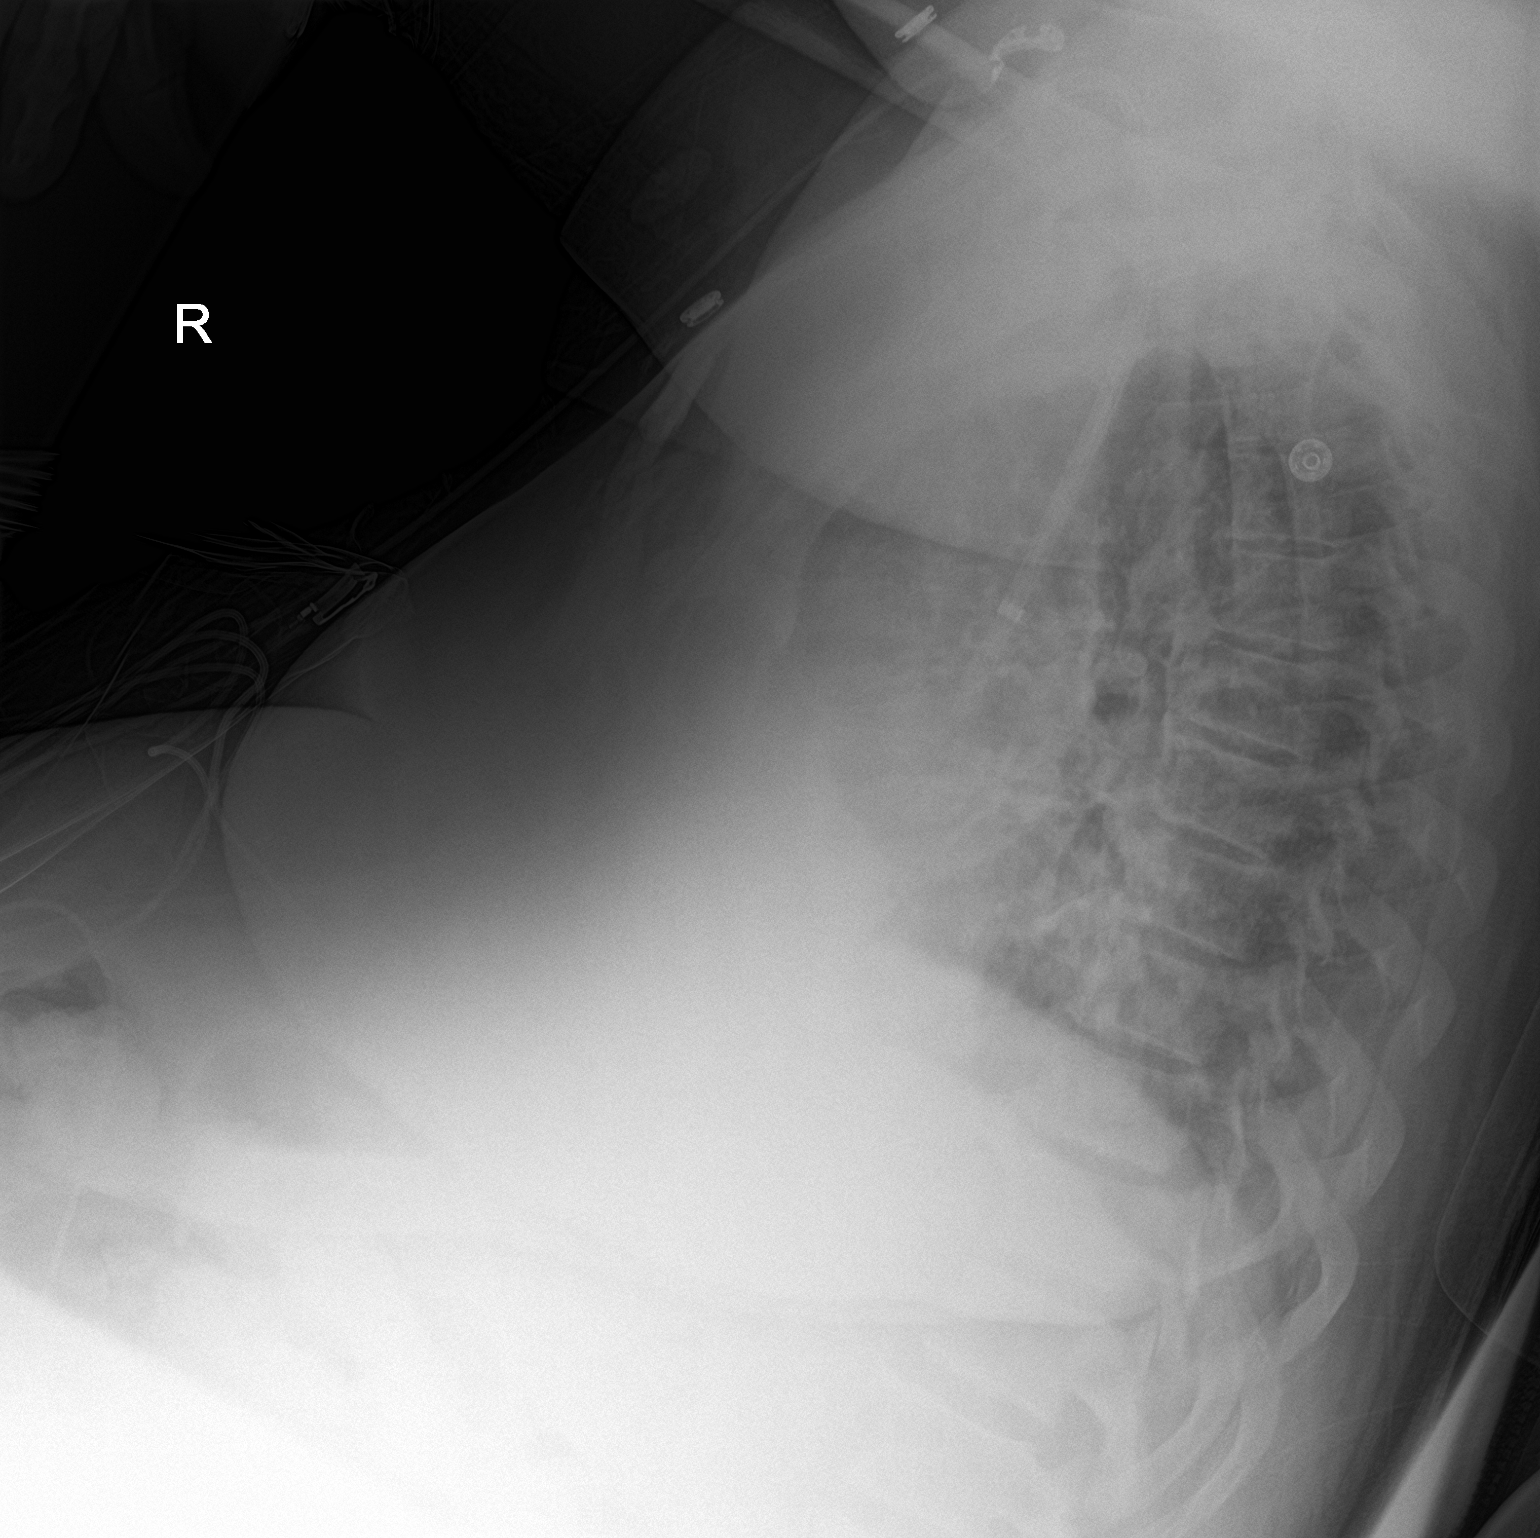

[2 of 2 positions shown; findings below may reference images not displayed]

FINDINGS: The lungs are adequately inflated. There is no focal infiltrate. The
cardiac silhouette is enlarged. The pulmonary vascularity is normal.
The dialysis catheter tip projects over the distal third of the SVC.
IMPRESSION: Stable cardiomegaly without pulmonary vascular congestion or
pulmonary edema. No acute pneumonia.
